# Patient Record
Sex: Female | Born: 1970 | Race: White | Hispanic: No | Marital: Married | Smoking: Never smoker
Health system: Southern US, Community
[De-identification: ages and names within clinical notes are randomized; demographics above are authoritative.]

## PROBLEM LIST (undated history)

## (undated) DIAGNOSIS — R112 Nausea with vomiting, unspecified: Secondary | ICD-10-CM

## (undated) DIAGNOSIS — IMO0002 Reserved for concepts with insufficient information to code with codable children: Secondary | ICD-10-CM

## (undated) DIAGNOSIS — F419 Anxiety disorder, unspecified: Secondary | ICD-10-CM

## (undated) DIAGNOSIS — K449 Diaphragmatic hernia without obstruction or gangrene: Secondary | ICD-10-CM

## (undated) DIAGNOSIS — T8859XA Other complications of anesthesia, initial encounter: Secondary | ICD-10-CM

## (undated) DIAGNOSIS — Z8489 Family history of other specified conditions: Secondary | ICD-10-CM

## (undated) DIAGNOSIS — E039 Hypothyroidism, unspecified: Secondary | ICD-10-CM

## (undated) DIAGNOSIS — R87619 Unspecified abnormal cytological findings in specimens from cervix uteri: Secondary | ICD-10-CM

## (undated) DIAGNOSIS — D496 Neoplasm of unspecified behavior of brain: Secondary | ICD-10-CM

## (undated) DIAGNOSIS — N920 Excessive and frequent menstruation with regular cycle: Secondary | ICD-10-CM

## (undated) DIAGNOSIS — K7689 Other specified diseases of liver: Secondary | ICD-10-CM

## (undated) DIAGNOSIS — Z9889 Other specified postprocedural states: Secondary | ICD-10-CM

## (undated) DIAGNOSIS — D649 Anemia, unspecified: Secondary | ICD-10-CM

## (undated) DIAGNOSIS — N649 Disorder of breast, unspecified: Secondary | ICD-10-CM

## (undated) DIAGNOSIS — F329 Major depressive disorder, single episode, unspecified: Secondary | ICD-10-CM

## (undated) DIAGNOSIS — G43109 Migraine with aura, not intractable, without status migrainosus: Secondary | ICD-10-CM

## (undated) DIAGNOSIS — T4145XA Adverse effect of unspecified anesthetic, initial encounter: Secondary | ICD-10-CM

## (undated) DIAGNOSIS — F32A Depression, unspecified: Secondary | ICD-10-CM

## (undated) HISTORY — DX: Neoplasm of unspecified behavior of brain: D49.6

## (undated) HISTORY — DX: Anemia, unspecified: D64.9

## (undated) HISTORY — DX: Anxiety disorder, unspecified: F41.9

## (undated) HISTORY — DX: Depression, unspecified: F32.A

## (undated) HISTORY — DX: Migraine with aura, not intractable, without status migrainosus: G43.109

## (undated) HISTORY — DX: Diaphragmatic hernia without obstruction or gangrene: K44.9

## (undated) HISTORY — DX: Excessive and frequent menstruation with regular cycle: N92.0

## (undated) HISTORY — DX: Reserved for concepts with insufficient information to code with codable children: IMO0002

## (undated) HISTORY — DX: Unspecified abnormal cytological findings in specimens from cervix uteri: R87.619

## (undated) HISTORY — DX: Major depressive disorder, single episode, unspecified: F32.9

## (undated) HISTORY — PX: CRYOTHERAPY: SHX1416

## (undated) HISTORY — DX: Other specified diseases of liver: K76.89

## (undated) HISTORY — DX: Disorder of breast, unspecified: N64.9

---

## 1996-05-29 HISTORY — PX: TUBAL LIGATION: SHX77

## 2006-05-29 HISTORY — PX: LAPAROSCOPIC CHOLECYSTECTOMY: SUR755

## 2007-05-30 HISTORY — PX: AUGMENTATION MAMMAPLASTY: SUR837

## 2008-05-29 HISTORY — PX: ENDOMETRIAL ABLATION: SHX621

## 2008-05-29 HISTORY — PX: BREAST BIOPSY: SHX20

## 2008-05-29 HISTORY — PX: ABDOMINOPLASTY: SUR9

## 2010-04-19 ENCOUNTER — Ambulatory Visit: Payer: Self-pay | Admitting: Diagnostic Radiology

## 2010-04-19 ENCOUNTER — Emergency Department (HOSPITAL_BASED_OUTPATIENT_CLINIC_OR_DEPARTMENT_OTHER): Admission: EM | Admit: 2010-04-19 | Discharge: 2010-04-19 | Payer: Self-pay | Admitting: Emergency Medicine

## 2010-08-09 LAB — POCT CARDIAC MARKERS
CKMB, poc: <1 ng/mL — ABNORMAL LOW (ref 1.0–8.0)
Myoglobin, poc: 32.7 ng/mL (ref 12–200)
Troponin i, poc: <0.05 ng/mL (ref 0.00–0.09)

## 2012-04-16 ENCOUNTER — Telehealth: Payer: Self-pay | Admitting: Family Medicine

## 2012-08-12 ENCOUNTER — Encounter: Payer: Self-pay | Admitting: Obstetrics and Gynecology

## 2012-08-13 ENCOUNTER — Encounter: Payer: Self-pay | Admitting: Gynecology

## 2012-08-13 ENCOUNTER — Ambulatory Visit (INDEPENDENT_AMBULATORY_CARE_PROVIDER_SITE_OTHER): Payer: BC Managed Care – PPO | Admitting: Gynecology

## 2012-08-13 VITALS — BP 104/76 | Ht 64.0 in | Wt 156.0 lb

## 2012-08-13 DIAGNOSIS — N949 Unspecified condition associated with female genital organs and menstrual cycle: Secondary | ICD-10-CM

## 2012-08-13 DIAGNOSIS — N920 Excessive and frequent menstruation with regular cycle: Secondary | ICD-10-CM | POA: Insufficient documentation

## 2012-08-13 DIAGNOSIS — R102 Pelvic and perineal pain: Secondary | ICD-10-CM | POA: Insufficient documentation

## 2012-08-13 NOTE — Patient Instructions (Signed)
Follow up for pelvic ultrasound, recommend motrin 600mg  every 6h as needed.

## 2012-08-13 NOTE — Progress Notes (Signed)
42 y.o. MarriedNot Hispanic or Latino female   [phone] here for complaint of pelvic pain.  Pain started 7 months ago with resumption of menses after thermoablation 2010.  Pain is primarily located low back/pelvis and radiating to right hip at last episode and is described as intermittent, sharp and stabbing.  Pain is aggravated by nothing specific and is associated with headache, diarrhea, nausea.  She has tried the following treatments advil 2 tabs twice  with little improvement in pain.  Pt reports blood is light, watery, nonclotted.  Current cycle lasted 9 days.  Pt denies postcoital bleeding. Pt reports this was 2nd cycle after ablation.  Patient is sexually active with last sexual activity 2 months ago.   ROS:  Nausea:  yes  Fever:  no  Weight loss/gain:  yes  Vaginal discharge or odor:  no  Other:  none  All other ROS questions are negative except as per HPI.  Exam:   BP 104/76  Wt 156 lb (70.761 kg)  LMP 07/19/2012 General appearance: alert and cooperative CV:  Not done Lungs:not done Abdomen:  soft, non-tender; bowel sounds normal; no masses,  no organomegaly Lymph:  Cervical adenopathy: none   Pelvic: External genitalia:  no lesions              Urethra: not indicated and normal appearing urethra with no masses, tenderness or lesions              Bartholins and Skenes: normal                 Vagina: normal appearing vagina with normal color and discharge, no lesions, vaginal discharge - clear and frothy, WET MOUNT done - results: negative for pathogens, normal epithelial cells              Cervix: normal appearance              Pap taken: no Bimanual Exam:  Uterus:  uterus is normal size, shape, consistency and nontender                               Adnexa:    not indicated and tenderness bilateral                               Rectovaginal: Confirms                               Anus:  normal sphincter tone, no lesions  Wet prep was obtained.  Results:  no pathogens  A:  pelvic pain      Pt with resumption of menses after endometrial ablation. Pt also with risk factors for cervical stenosis with 3 prior cryotherapies also with bilateral tubal ligation.  Pt at risk for hematometria.  Recommend pt return to office for pelvic ultrasound.  Pt reports normal TSH by PCP due to weight gain. hematometria discussed at length, questions addressed.  P: Labs:  none     Radiology:  Pelvic ultrasound     Medications:  Motrin 600mg  q6h prn, pt has     Patient will follow-up for ultrasound       An After Visit Summary was printed and given to the patient.

## 2012-08-14 ENCOUNTER — Other Ambulatory Visit: Payer: Self-pay | Admitting: *Deleted

## 2012-08-14 ENCOUNTER — Ambulatory Visit (INDEPENDENT_AMBULATORY_CARE_PROVIDER_SITE_OTHER): Payer: BC Managed Care – PPO

## 2012-08-14 ENCOUNTER — Ambulatory Visit: Payer: Self-pay | Admitting: Obstetrics and Gynecology

## 2012-08-14 ENCOUNTER — Encounter: Payer: Self-pay | Admitting: Gynecology

## 2012-08-14 ENCOUNTER — Ambulatory Visit (INDEPENDENT_AMBULATORY_CARE_PROVIDER_SITE_OTHER): Payer: BC Managed Care – PPO | Admitting: Gynecology

## 2012-08-14 DIAGNOSIS — N943 Premenstrual tension syndrome: Secondary | ICD-10-CM

## 2012-08-14 DIAGNOSIS — D649 Anemia, unspecified: Secondary | ICD-10-CM

## 2012-08-14 DIAGNOSIS — N9489 Other specified conditions associated with female genital organs and menstrual cycle: Secondary | ICD-10-CM

## 2012-08-14 DIAGNOSIS — R635 Abnormal weight gain: Secondary | ICD-10-CM

## 2012-08-14 LAB — CBC
HCT: 39.9 % (ref 36.0–46.0)
Hemoglobin: 13.5 g/dL (ref 12.0–15.0)
MCH: 28.2 pg (ref 26.0–34.0)
MCHC: 33.8 g/dL (ref 30.0–36.0)
MCV: 83.5 fL (ref 78.0–100.0)
Platelets: 344 10*3/uL (ref 150–400)
RBC: 4.78 MIL/uL (ref 3.87–5.11)
RDW: 13.7 % (ref 11.5–15.5)
WBC: 6.4 10*3/uL (ref 4.0–10.5)

## 2012-08-14 LAB — THYROID PANEL WITH TSH
Free Thyroxine Index: 2.3 (ref 1.0–3.9)
T3 Uptake: 32.3 % (ref 22.5–37.0)
T4, Total: 7.2 ug/dL (ref 5.0–12.5)
TSH: 1.258 u[IU]/mL (ref 0.350–4.500)

## 2012-08-14 MED ORDER — VENLAFAXINE HCL ER 75 MG PO CP24: 75.0000 mg | ORAL_CAPSULE | Freq: Every day | ORAL | Status: DC

## 2012-08-14 MED ORDER — VENLAFAXINE HCL ER 37.5 MG PO CP24: 37.5000 mg | ORAL_CAPSULE | Freq: Every day | ORAL | Status: DC

## 2012-08-14 NOTE — Progress Notes (Signed)
Pt presents today as follow up of yesterday's visit, ultrasound done today to evaluate for hematometria. Pt also expresses largest concerns are her moodiness that she is unable to track as her menses are no longer predictable since having her ablation in 2010 as well as unexplained weight gain. Pt had been treated with Pristiq at that time but discontinued due to 8lb of weight after short period although she reports feeling better.  Pt states that she has been working faithfully with a trainer for the past 37m but has not lost any weight.  In the past the patient had been treated with phentermine by another provider and failed to lose weight.   Her ultrasound report was reviewed with the patient. The uterus was notable for an inhomogeneous myometrium, suggestive of adenomyosis, no fibroids noted.  The endometrium was irregular and echodense.  The adnexa were normal except for a corpus luteum cyst noted on the right.  There was no free fluid noted.  The patient noted being tender during the exam.  A/P Uterine tenderness, uterine scarring leading to hematometria cannot be ruled out, the pt reports light, watery, nonclotted menses.  She is agreeable to watching her symptoms and reporting and vaginal bleeding.  She will take aleve as needed, she declines an rx for tramadol but can call or return to office if symptoms worsen. Regarding her weight gain-TFT's will be drawn, pt requests cbc as was noted anemic without follow up in past. PMDD-pt agreeable to try effexor for sx, will watch diet, exercise and weight.  rx 37.5mg  given, instructed to increase to 75mg  after 2 weeks and notify office of dose change

## 2012-08-14 NOTE — Patient Instructions (Addendum)
Keep calendar of premenstrual symptoms and response to effexor  -pelvic pain  -menses-length and quality  -weight and exercise routine Follow up 3 m, bring calendar to office visit

## 2012-08-15 ENCOUNTER — Telehealth: Payer: Self-pay | Admitting: Gynecology

## 2012-08-15 NOTE — Telephone Encounter (Signed)
PT wants to let Dr Farrel Gobble know her cycle started this morning

## 2012-11-11 ENCOUNTER — Ambulatory Visit: Payer: BC Managed Care – PPO | Admitting: Gynecology

## 2012-11-11 ENCOUNTER — Telehealth: Payer: Self-pay | Admitting: Certified Nurse Midwife

## 2012-11-11 NOTE — Telephone Encounter (Signed)
LMTCB to reschedule missed appointment for 3 month recheck.

## 2012-11-15 ENCOUNTER — Ambulatory Visit (INDEPENDENT_AMBULATORY_CARE_PROVIDER_SITE_OTHER): Payer: BC Managed Care – PPO | Admitting: Gynecology

## 2012-11-15 VITALS — BP 116/68 | HR 78 | Resp 14 | Ht 64.0 in | Wt 155.0 lb

## 2012-11-15 DIAGNOSIS — N943 Premenstrual tension syndrome: Secondary | ICD-10-CM

## 2012-11-15 DIAGNOSIS — R635 Abnormal weight gain: Secondary | ICD-10-CM

## 2012-11-15 DIAGNOSIS — R102 Pelvic and perineal pain: Secondary | ICD-10-CM

## 2012-11-15 DIAGNOSIS — N949 Unspecified condition associated with female genital organs and menstrual cycle: Secondary | ICD-10-CM

## 2012-11-15 MED ORDER — VENLAFAXINE HCL ER 37.5 MG PO CP24: 37.5000 mg | ORAL_CAPSULE | Freq: Every day | ORAL | Status: DC

## 2012-11-15 NOTE — Progress Notes (Signed)
Pt here for follow up of weight gain and pelvic pain.  Pt is still working out with trainer on regular basis-5x/w, has adjusted regimen and has tracking intake carefully.  Pt recently did a cleanse but no weight loss, since last visit, she lost only 1#, friends lost 12#.  Pt did not loose weight on [employer].  Pt acutely stopped effexor 2w ago, due to sweats, insomnia, felt worse on 75mg , interested in going back to 37.g. Pt reports pelvic pain better but no menses since 06/2012, reports new onset of deep pelvic pain with sex but denies dryness.   Pt admits to being quick to discontinue diets and regimens if she doesn't see results quickly, did Aktins for only 56m, lost 5# but regained after returning to prior diet.  Pt states weight gain was related to dietary changes assocaited with job that required a lot of travelling so a lot of fast food,but even though she had changed back to her former eating habits, she has not really lost weight.   PE: Genreal: NAD Pelvic exam: VULVA: normal appearing vulva with no masses, tenderness or lesions,  VAGINA: normal appearing vagina with normal color and discharge, no lesions,  CERVIX: normal appearing cervix without discharge or lesions,tender to deep palpation UTERUS: uterus is normal size, shape, consistency,tender to deep palpation ADNEXA: normal adnexa in size, nontender and no masses.  A:  Weight management Pelvic pain-history of ablation  P;  We discussed the importance of smart choices regarding calories, not all are equal, her typical breakfast is high in carbs, low in protein and low in calories, we suggested that the carbs might be converted to fat as her caloric intake seems variable and low, suggested increasing the proteins, complex carbohydrates and decreasing the simple carbs.  We discussed the importance of hidden calories.  Changing exercise regiment, BMI not as important if she is gaining muscle and loosing fat and might do better looking at  the whole image and not the scale Recommend that she give herself a longer window of time before getting discouraged and quitting. She agrees Suspect the pain is related to small amounts of blood trapped from the ablation, suggest position change to avoid hitting cervix and uterus Refill on effexor 37.5 will stay at this dose.  Pt is leaving for 46m trip to [place] unclear if she will start rx before or after returns. Pt will be cooking many meals on vacation as opposed to eating out Length of visit 41m, >50% face to face discussing diet and exercise

## 2013-01-08 ENCOUNTER — Other Ambulatory Visit: Payer: Self-pay | Admitting: Gynecology

## 2013-01-08 NOTE — Telephone Encounter (Signed)
eScribe request for refill on VENLAFAXINE Last filled - 11/15/12, #30 X 7 Last OV - 11/15/12 Next AEX - 03/03/13 RX sent at last visit for 37.5 mg dose.  RX denied for 75mg  dose.

## 2013-03-03 ENCOUNTER — Ambulatory Visit: Payer: Self-pay | Admitting: Obstetrics and Gynecology

## 2013-03-12 ENCOUNTER — Encounter: Payer: Self-pay | Admitting: Gynecology

## 2013-03-12 ENCOUNTER — Ambulatory Visit (INDEPENDENT_AMBULATORY_CARE_PROVIDER_SITE_OTHER): Payer: BC Managed Care – PPO | Admitting: Gynecology

## 2013-03-12 VITALS — BP 100/74 | HR 72 | Resp 16 | Ht 64.0 in | Wt 166.0 lb

## 2013-03-12 DIAGNOSIS — R635 Abnormal weight gain: Secondary | ICD-10-CM

## 2013-03-12 DIAGNOSIS — N943 Premenstrual tension syndrome: Secondary | ICD-10-CM | POA: Insufficient documentation

## 2013-03-12 DIAGNOSIS — Z0000 Encounter for general adult medical examination without abnormal findings: Secondary | ICD-10-CM

## 2013-03-12 DIAGNOSIS — Z01419 Encounter for gynecological examination (general) (routine) without abnormal findings: Secondary | ICD-10-CM

## 2013-03-12 DIAGNOSIS — Z9889 Other specified postprocedural states: Secondary | ICD-10-CM | POA: Insufficient documentation

## 2013-03-12 LAB — POCT URINALYSIS DIPSTICK
Leukocytes, UA: NEGATIVE
Urobilinogen, UA: NEGATIVE
pH, UA: 5

## 2013-03-12 LAB — COMPREHENSIVE METABOLIC PANEL
ALT: 11 U/L (ref 0–35)
AST: 15 U/L (ref 0–37)
Albumin: 4.3 g/dL (ref 3.5–5.2)
Alkaline Phosphatase: 63 U/L (ref 39–117)
BUN: 16 mg/dL (ref 6–23)
CO2: 27 mEq/L (ref 19–32)
Calcium: 9.5 mg/dL (ref 8.4–10.5)
Chloride: 104 mEq/L (ref 96–112)
Creat: 0.69 mg/dL (ref 0.50–1.10)
Glucose, Bld: 87 mg/dL (ref 70–99)
Potassium: 3.8 mEq/L (ref 3.5–5.3)
Sodium: 135 mEq/L (ref 135–145)
Total Bilirubin: 0.3 mg/dL (ref 0.3–1.2)
Total Protein: 6.6 g/dL (ref 6.0–8.3)

## 2013-03-12 LAB — HEMOGLOBIN, FINGERSTICK: Hemoglobin, fingerstick: 12.9 g/dL (ref 12.0–16.0)

## 2013-03-12 NOTE — Patient Instructions (Signed)

## 2013-03-12 NOTE — Progress Notes (Signed)
42 y.o. Married Caucasian female   [phone] here for annual exam. Pt is currently sexually active.  Menses restarted after ablation, 8/14-6d. Associated with pain and migraine, 01/2013 migraine and dysmenorrhea.  Pt denies any bleeding after sex, new onset of pain-too deep, no change in position.  Pt happy with effexor 37.5 Pt had gained 11# in 63m, recently joined weight watchers, exercises on a regular basis and until recently was seeing a Systems analyst.  Patient's last menstrual period was 03/09/2013.          Sexually active: yes  The current method of family planning is tubal ligation.    Exercising: yes  cardio 4-5x/wk Last pap: 02/26/12 NEG  Alcohol: no Tobacco: no BSE: yes Mammogram:  2014-will send results  Hgb:12.9 ; Urine: Negative    Health Maintenance  Topic Date Due  . Pap Smear  09/09/1986  . Influenza Vaccine  12/27/2012  . Tetanus/tdap  05/29/2017    Family History  Problem Relation Age of Onset  . Hypertension Mother   . Diabetes Mother 46    Type 2  . Anesthesia problems Mother     anesthetic complication at hysterectomy  . Hypertension Sister     Patient Active Problem List   Diagnosis Date Noted  . Anemia 08/14/2012  . Pelvic pain in female 08/13/2012  . Menorrhagia     Past Medical History  Diagnosis Date  . Anxiety   . Depression   . Abnormal Pap smear   . Anemia     iron deficient  . Breast disorder     precencerous lesion, marker placed  . Migraine headache with aura   . Menorrhagia     Past Surgical History  Procedure Laterality Date  . Tubal ligation  1998  . Laparoscopic cholecystectomy  2008  . Endometrial ablation  2010  . Augmentation mammaplasty Bilateral 2009  . Breast biopsy Left 2010    precancerous bx  . Abdominoplasty  2010  . Cryotherapy      cervix-3x    Allergies: Adhesive; Lortab; and Penicillins  Current Outpatient Prescriptions  Medication Sig Dispense Refill  . Ibuprofen (ADVIL PO) Take by mouth as  needed.       . IRON PO Take by mouth.      . Multiple Vitamin (MULTIVITAMIN) tablet Take 1 tablet by mouth daily.      Marland Kitchen topiramate (TOPAMAX) 100 MG tablet Take 100 mg by mouth 2 (two) times daily.      Marland Kitchen venlafaxine XR (EFFEXOR XR) 37.5 MG 24 hr capsule Take 1 capsule (37.5 mg total) by mouth daily.  30 capsule  7  . venlafaxine XR (EFFEXOR XR) 75 MG 24 hr capsule Take 1 capsule (75 mg total) by mouth daily.  30 capsule  2   No current facility-administered medications for this visit.    ROS: Pertinent items are noted in HPI.  Exam:    Ht 5\' 4"  (1.626 m)  Wt 166 lb (75.297 kg)  BMI 28.48 kg/m2  LMP 03/09/2013 Weight change: @WEIGHTCHANGE @ Last 3 height recordings:  Ht Readings from Last 3 Encounters:  03/12/13 5\' 4"  (1.626 m)  11/15/12 5\' 4"  (1.626 m)  08/13/12 5\' 4"  (1.626 m)   General appearance: alert, cooperative and appears stated age Head: Normocephalic, without obvious abnormality, atraumatic Neck: no adenopathy, no carotid bruit, no JVD, supple, symmetrical, trachea midline and thyroid not enlarged, symmetric, no tenderness/mass/nodules Lungs: clear to auscultation bilaterally Breasts: normal appearance, no masses or tenderness Heart: regular rate  and rhythm, S1, S2 normal, no murmur, click, rub or gallop Abdomen: soft, non-tender; bowel sounds normal; no masses,  no organomegaly Extremities: extremities normal, atraumatic, no cyanosis or edema Skin: Skin color, texture, turgor normal. No rashes or lesions Lymph nodes: Cervical, supraclavicular, and axillary nodes normal. no inguinal nodes palpated Neurologic: Grossly normal   Pelvic: External genitalia:  no lesions              Urethra: normal appearing urethra with no masses, tenderness or lesions              Bartholins and Skenes: normal                 Vagina: normal appearing vagina with normal color and discharge, no lesions              Cervix: normal appearance              Pap taken: yes        Bimanual  Exam:  Uterus:  uterus is normal size, shape, consistency and nontender                                      Adnexa:    normal adnexa in size, nontender and no masses                                      Rectovaginal: Confirms                                      Anus:  normal sphincter tone, no lesions  A: well woman PMS on effexor Weight gain S/p ablation dysmenorrhea     P: mammogram-pt will fax results PAP with HrHPV-record review shows ASCUS, cryo 10y before for? Guidelines reviewed Discussed stopping effexor if cause of weight gain or consider short course of phentiramine Discussed dysmenorrhea s/p ablation, ok to watch for now, most severe 1d before menses counseled on breast self exam, mammography screening, adequate intake of calcium and vitamin D, diet and exercise return annually or prn   An After Visit Summary was printed and given to the patient.

## 2013-03-17 LAB — IPS PAP TEST WITH HPV

## 2014-01-10 ENCOUNTER — Emergency Department (HOSPITAL_BASED_OUTPATIENT_CLINIC_OR_DEPARTMENT_OTHER)
Admission: EM | Admit: 2014-01-10 | Discharge: 2014-01-10 | Disposition: A | Discharge status: Home / Self Care | Payer: BC Managed Care – PPO | Attending: Emergency Medicine | Admitting: Emergency Medicine

## 2014-01-10 ENCOUNTER — Encounter (HOSPITAL_BASED_OUTPATIENT_CLINIC_OR_DEPARTMENT_OTHER): Payer: Self-pay | Admitting: Emergency Medicine

## 2014-01-10 ENCOUNTER — Emergency Department (HOSPITAL_BASED_OUTPATIENT_CLINIC_OR_DEPARTMENT_OTHER): Payer: BC Managed Care – PPO

## 2014-01-10 DIAGNOSIS — Z862 Personal history of diseases of the blood and blood-forming organs and certain disorders involving the immune mechanism: Secondary | ICD-10-CM | POA: Insufficient documentation

## 2014-01-10 DIAGNOSIS — F329 Major depressive disorder, single episode, unspecified: Secondary | ICD-10-CM | POA: Diagnosis not present

## 2014-01-10 DIAGNOSIS — IMO0002 Reserved for concepts with insufficient information to code with codable children: Secondary | ICD-10-CM | POA: Insufficient documentation

## 2014-01-10 DIAGNOSIS — F411 Generalized anxiety disorder: Secondary | ICD-10-CM | POA: Insufficient documentation

## 2014-01-10 DIAGNOSIS — Z79899 Other long term (current) drug therapy: Secondary | ICD-10-CM | POA: Insufficient documentation

## 2014-01-10 DIAGNOSIS — F3289 Other specified depressive episodes: Secondary | ICD-10-CM | POA: Diagnosis not present

## 2014-01-10 DIAGNOSIS — Y9389 Activity, other specified: Secondary | ICD-10-CM | POA: Diagnosis not present

## 2014-01-10 DIAGNOSIS — Z8742 Personal history of other diseases of the female genital tract: Secondary | ICD-10-CM | POA: Insufficient documentation

## 2014-01-10 DIAGNOSIS — G43909 Migraine, unspecified, not intractable, without status migrainosus: Secondary | ICD-10-CM | POA: Diagnosis not present

## 2014-01-10 DIAGNOSIS — W010XXA Fall on same level from slipping, tripping and stumbling without subsequent striking against object, initial encounter: Secondary | ICD-10-CM | POA: Diagnosis not present

## 2014-01-10 DIAGNOSIS — Y929 Unspecified place or not applicable: Secondary | ICD-10-CM | POA: Insufficient documentation

## 2014-01-10 DIAGNOSIS — Z880 Allergy status to penicillin: Secondary | ICD-10-CM | POA: Diagnosis not present

## 2014-01-10 DIAGNOSIS — M533 Sacrococcygeal disorders, not elsewhere classified: Secondary | ICD-10-CM

## 2014-01-10 DIAGNOSIS — Z791 Long term (current) use of non-steroidal anti-inflammatories (NSAID): Secondary | ICD-10-CM | POA: Diagnosis not present

## 2014-01-10 MED ORDER — OXYCODONE-ACETAMINOPHEN 5-325 MG PO TABS: 1.0000 | ORAL_TABLET | Freq: Four times a day (QID) | ORAL | Status: DC | PRN

## 2014-01-10 NOTE — ED Provider Notes (Signed)
CSN: [id number]     Arrival date & time 01/10/14  1709 History   First MD Initiated Contact with Patient 01/10/14 1730     Chief Complaint  Patient presents with  . Fall     (Consider location/radiation/quality/duration/timing/severity/associated sxs/prior Treatment) HPI Comments: Patient is a 43 year old female who presents to the emergency department complaining of buttock pain x6 days. Patient reports 6 days ago she slipped on an uneven step causing her to fall directly onto her buttock area. She noticed immediately that she had some scrapes. Pain has remained present over the past 6 days, however worsening over the past 2 days. Pain worse with sitting or laying on her buttock area. She is able to have normal BM, however has pain when she bares down. She has tried taking Advil with minimal relief. States she has slight lower back pain. Denies pain, numbness or tingling radiating down her extremities. Denies loss of control of bowels or bladder or saddle anesthesia.  Patient is a 43 y.o. female presenting with fall. The history is provided by the patient.  Fall    Past Medical History  Diagnosis Date  . Anxiety   . Depression   . Abnormal Pap smear   . Anemia     iron deficient  . Breast disorder     precencerous lesion, marker placed  . Migraine headache with aura   . Menorrhagia    Past Surgical History  Procedure Laterality Date  . Tubal ligation  1998  . Laparoscopic cholecystectomy  2008  . Endometrial ablation  2010  . Augmentation mammaplasty Bilateral 2009  . Breast biopsy Left 2010    precancerous bx  . Abdominoplasty  2010  . Cryotherapy      cervix-3x   Family History  Problem Relation Age of Onset  . Hypertension Mother   . Diabetes Mother 79    Type 2  . Anesthesia problems Mother     anesthetic complication at hysterectomy  . Hypertension Sister   . Diabetes Brother    History  Substance Use Topics  . Smoking status: Never Smoker   . Smokeless  tobacco: Not on file  . Alcohol Use: 1.0 oz/week    2 drink(s) per week     Comment: occ wine    OB History   Grav Para Term Preterm Abortions TAB SAB Ect Mult Living   3 3 3       3      Review of Systems  Musculoskeletal: Positive for back pain.       + buttock pain.  All other systems reviewed and are negative.     Allergies  Adhesive; Lortab; and Penicillins  Home Medications   Prior to Admission medications   Medication Sig Start Date End Date Taking? Authorizing Provider  Celecoxib (CELEBREX PO) Take by mouth.    Historical Provider, MD  Ibuprofen (ADVIL PO) Take by mouth as needed.     Historical Provider, MD  IRON PO Take by mouth.    Historical Provider, MD  Multiple Vitamin (MULTIVITAMIN) tablet Take 1 tablet by mouth daily.    Historical Provider, MD  oxyCODONE-acetaminophen (PERCOCET) 5-325 MG per tablet Take 1-2 tablets by mouth every 6 (six) hours as needed for severe pain. 01/10/14   Illene Labrador, PA-C  topiramate (TOPAMAX) 100 MG tablet Take 100 mg by mouth 2 (two) times daily.    Historical Provider, MD  venlafaxine XR (EFFEXOR XR) 37.5 MG 24 hr capsule Take 1 capsule (37.5 mg  total) by mouth daily. 11/15/12   Azalia Bilis, MD   BP 122/77  Pulse 80  Temp(Src) 98 F (36.7 C) (Oral)  Resp 18  Ht 5\' 4"  (1.626 m)  Wt 165 lb (74.844 kg)  BMI 28.31 kg/m2  SpO2 100%  LMP 10/10/2013 Physical Exam  Nursing note and vitals reviewed. Constitutional: She is oriented to person, place, and time. She appears well-developed and well-nourished. No distress.  HENT:  Head: Normocephalic and atraumatic.  Mouth/Throat: Oropharynx is clear and moist.  Eyes: Conjunctivae and EOM are normal.  Neck: Normal range of motion. Neck supple. No spinous process tenderness and no muscular tenderness present.  Cardiovascular: Normal rate, regular rhythm and normal heart sounds.   Pulmonary/Chest: Effort normal and breath sounds normal. No respiratory distress.  Musculoskeletal:  Normal range of motion. She exhibits no edema.  TTP lower lumbar spine into sacrum and coccyx. Small amount of bruising to bilateral buttock area. Full lumbar ROM. Full ROM bilateral hips without pain.  Neurological: She is alert and oriented to person, place, and time. She has normal strength. No sensory deficit.  Strength lower extremities 5/5 and equal bilateral. Sensation intact. Normal gait.  Skin: Skin is warm and dry. No rash noted. She is not diaphoretic.  Psychiatric: She has a normal mood and affect. Her behavior is normal.    ED Course  Procedures (including critical care time) Labs Review Labs Reviewed - No data to display  Imaging Review Dg Lumbar Spine Complete  01/10/2014   CLINICAL DATA:  Fall 1 week ago.  Back pain.  EXAM: LUMBAR SPINE - COMPLETE 4+ VIEW  COMPARISON:  None.  FINDINGS: Vertebral body height and alignment are maintained. No pars interarticularis defect is identified. Intervertebral disc space height is normal. The patient is status post cholecystectomy. A small sclerotic lesion in the left ilium is likely a bone island.  IMPRESSION: Negative exam.   Electronically Signed   By: Inge Rise M.D.   On: 01/10/2014 18:20   Dg Sacrum/coccyx  01/10/2014   CLINICAL DATA:  Status post fall 1 week ago.  Coccygeal pain.  EXAM: SACRUM AND COCCYX - 2+ VIEW  COMPARISON:  None.  FINDINGS: There is no acute bony or joint abnormality. Small, spiculated sclerotic lesion in the left ilium is likely a bone island.  IMPRESSION: No acute finding.   Electronically Signed   By: Inge Rise M.D.   On: 01/10/2014 18:19     EKG Interpretation None      MDM   Final diagnoses:  Coccyalgia   Patient presenting with pain into her buttock area and low back. She is well appearing and in no apparent distress. Vital signs stable. No red flags concerning patient's back pain. No s/s of central cord compression or cauda equina. Lower extremities are neurovascularly intact and  patient is ambulating without difficulty. X-rays without any acute findings. Advised patient to continue with NSAIDs, ice, will give 6 Percocet for severe pain. Followup with PCP. Stable for discharge. Return precautions given. Patient states understanding of treatment care plan and is agreeable.   Illene Labrador, PA-C 01/10/14 1836

## 2014-01-10 NOTE — ED Notes (Signed)
Pt tripped and fell on Wednesday landing on her buttocks. She has "tailbone" pain that has been getting increasingly worse since the fall.

## 2014-01-10 NOTE — ED Provider Notes (Signed)
History/physical exam/procedure(s) were performed by non-physician practitioner and as supervising physician I was immediately available for consultation/collaboration. I have reviewed all notes and am in agreement with care and plan.   Shaune Pollack, MD 01/10/14 2223

## 2014-01-10 NOTE — ED Notes (Signed)
Patient transported to X-ray 

## 2014-01-10 NOTE — Discharge Instructions (Signed)
Apply ice to the area where you are sore. You may also send a warm bath. Using a doughnut to sit on may help relieve some pain. Take ibuprofen, naproxen or Tylenol every 6-8 hours as needed for pain. Take percocet for severe pain only. No driving or operating heavy machinery while taking percocet. This medication may cause drowsiness.  Tailbone Injury The tailbone (coccyx) is the small bone at the lower end of the spine. A tailbone injury may involve stretched ligaments, bruising, or a broken bone (fracture). Women are more vulnerable to this injury due to having a wider pelvis. CAUSES  This type of injury typically occurs from falling and landing on the tailbone. Repeated strain or friction from actions such as rowing and bicycling may also injure the area. The tailbone can be injured during childbirth. Infections or tumors may also press on the tailbone and cause pain. Sometimes, the cause of injury is unknown. SYMPTOMS   Bruising.  Pain when sitting.  Painful bowel movements.  In women, pain during intercourse. DIAGNOSIS  Your caregiver can diagnose a tailbone injury based on your symptoms and a physical exam. X-rays may be taken if a fracture is suspected. Your caregiver may also use an MRI scan imaging test to evaluate your symptoms. TREATMENT  Your caregiver may prescribe medicines to help relieve your pain. Most tailbone injuries heal on their own in 4 to 6 weeks. However, if the injury is caused by an infection or tumor, the recovery period may vary. PREVENTION  Wear appropriate padding and sports gear when bicycling and rowing. This can help prevent an injury from repeated strain or friction. HOME CARE INSTRUCTIONS   Put ice on the injured area.  Put ice in a plastic bag.  Place a towel between your skin and the bag.  Leave the ice on for 15-20 minutes, every hour while awake for the first 1 to 2 days.  Sit on a large, rubber or inflated ring or cushion to ease your pain. Lean  forward when sitting to help decrease discomfort.  Avoid sitting for long periods of time.  Increase your activity as the pain allows.  Only take over-the-counter or prescription medicines for pain, discomfort, or fever as directed by your caregiver.  You may use stool softeners if it is painful to have a bowel movement, or as directed by your caregiver.  Eat a diet with plenty of fiber to help prevent constipation.  Keep all follow-up appointments as directed by your caregiver. SEEK MEDICAL CARE IF:   Your pain becomes worse.  Your bowel movements cause a great deal of discomfort.  You are unable to have a bowel movement.  You have a fever. MAKE SURE YOU:  Understand these instructions.  Will watch your condition.  Will get help right away if you are not doing well or get worse. Document Released: 05/12/2000 Document Revised: 08/07/2011 Document Reviewed: 12/08/2010 [facility] Patient Information 2015 [place], [place]. This information is not intended to replace advice given to you by your health care provider. Make sure you discuss any questions you have with your health care provider.

## 2014-03-13 ENCOUNTER — Ambulatory Visit: Payer: BC Managed Care – PPO | Admitting: Gynecology

## 2014-03-16 ENCOUNTER — Encounter: Payer: Self-pay | Admitting: Gynecology

## 2014-03-16 ENCOUNTER — Ambulatory Visit: Payer: BC Managed Care – PPO | Admitting: Gynecology

## 2014-03-30 ENCOUNTER — Encounter (HOSPITAL_BASED_OUTPATIENT_CLINIC_OR_DEPARTMENT_OTHER): Payer: Self-pay | Admitting: Emergency Medicine

## 2014-11-26 ENCOUNTER — Ambulatory Visit: Payer: Self-pay | Admitting: Nurse Practitioner

## 2014-11-26 ENCOUNTER — Telehealth: Payer: Self-pay | Admitting: Nurse Practitioner

## 2014-11-26 NOTE — Telephone Encounter (Signed)
Patient called and cancelled her appointment for her AEX today due to her "husband's colonoscopy." She rescheduled for 12/02/14 with Dr. Talbert Nan. FYI only.

## 2014-12-02 ENCOUNTER — Ambulatory Visit: Payer: Self-pay | Admitting: Obstetrics and Gynecology

## 2014-12-02 ENCOUNTER — Encounter: Payer: Self-pay | Admitting: Obstetrics and Gynecology

## 2015-03-11 ENCOUNTER — Encounter: Payer: Self-pay | Admitting: Gynecology

## 2015-03-11 ENCOUNTER — Other Ambulatory Visit (HOSPITAL_COMMUNITY)
Admission: RE | Admit: 2015-03-11 | Discharge: 2015-03-11 | Disposition: A | Discharge status: Home / Self Care | Payer: BLUE CROSS/BLUE SHIELD | Source: Ambulatory Visit | Attending: Gynecology | Admitting: Gynecology

## 2015-03-11 ENCOUNTER — Ambulatory Visit (INDEPENDENT_AMBULATORY_CARE_PROVIDER_SITE_OTHER): Payer: BLUE CROSS/BLUE SHIELD | Admitting: Gynecology

## 2015-03-11 VITALS — BP 124/80 | Ht 64.0 in | Wt 164.0 lb

## 2015-03-11 DIAGNOSIS — R102 Pelvic and perineal pain: Secondary | ICD-10-CM | POA: Diagnosis not present

## 2015-03-11 DIAGNOSIS — R61 Generalized hyperhidrosis: Secondary | ICD-10-CM | POA: Diagnosis not present

## 2015-03-11 DIAGNOSIS — N9419 Other specified dyspareunia: Secondary | ICD-10-CM | POA: Diagnosis not present

## 2015-03-11 DIAGNOSIS — Z01419 Encounter for gynecological examination (general) (routine) without abnormal findings: Secondary | ICD-10-CM | POA: Insufficient documentation

## 2015-03-11 DIAGNOSIS — Z1151 Encounter for screening for human papillomavirus (HPV): Secondary | ICD-10-CM | POA: Diagnosis present

## 2015-03-11 LAB — TSH: TSH: 1.084 u[IU]/mL (ref 0.350–4.500)

## 2015-03-11 NOTE — Patient Instructions (Signed)
Laparoscopically Assisted Vaginal Hysterectomy A laparoscopically assisted vaginal hysterectomy (LAVH) is a surgical procedure to remove the uterus and cervix, and sometimes the ovaries and fallopian tubes. During an LAVH, some of the surgical removal is done through the vagina, and the rest is done through a few small surgical cuts (incisions) in the abdomen.  This procedure is usually considered in women when a vaginal hysterectomy is not an option. Your health care provider will discuss the risks and benefits of the different surgical techniques at your appointment. Generally, recovery time is faster and there are fewer complications after laparoscopic procedures than after open incisional procedures. LET YOUR HEALTH CARE PROVIDER KNOW ABOUT:   Any allergies you have.  All medicines you are taking, including vitamins, herbs, eye drops, creams, and over-the-counter medicines.  Previous problems you or members of your family have had with the use of anesthetics.  Any blood disorders you have.  Previous surgeries you have had.  Medical conditions you have. RISKS AND COMPLICATIONS Generally, this is a safe procedure. However, as with any procedure, complications can occur. Possible complications include:  Allergies to medicines.  Difficulty breathing.  Bleeding.  Infection.  Damage to other structures near your uterus and cervix. BEFORE THE PROCEDURE  Ask your health care provider about changing or stopping your regular medicines.  Take certain medicines, such as a colon-emptying preparation, as directed.  Do not eat or drink anything for at least 8 hours before your surgery.  Stop smoking if you smoke. Stopping will improve your health after surgery.  Arrange for a ride home after surgery and for help at home during recovery. PROCEDURE   An IV tube will be put into one of your veins in order to give you fluids and medicines.  You will receive medicines to relax you and  medicines that make you sleep (general anesthetic).  You may have a flexible tube (catheter) put into your bladder to drain urine.  You may have a tube put through your nose or mouth that goes into your stomach (nasogastric tube). The nasogastric tube removes digestive fluids and prevents you from feeling nauseated and from vomiting.  Tight-fitting (compression) stockings will be placed on your legs to promote circulation.  Three to four small incisions will be made in your abdomen. An incision also will be made in your vagina. Probes and tools will be inserted into the small incisions. The uterus and cervix are removed (and possibly your ovaries and fallopian tubes) through your vagina as well as through the small incisions that were made in the abdomen.  Your vagina is then sewn back to normal. AFTER THE PROCEDURE  You may have a liquid diet temporarily. You will most likely return to, and tolerate, your usual diet the day after surgery.  You will be passing urine through a catheter. It will be removed the day after surgery.  Your temperature, breathing rate, heart rate, blood pressure, and oxygen level will be monitored regularly.  You will still wear compression stockings on your legs until you are able to move around.  You will use a special device or do breathing exercises to keep your lungs clear.  You will be encouraged to walk as soon as possible.   This information is not intended to replace advice given to you by your health care provider. Make sure you discuss any questions you have with your health care provider.   Document Released: 05/04/2011 Document Revised: 06/05/2014 Document Reviewed: 11/28/2012 Elsevier Interactive Patient Education 2016 Else[employer] Endometriosis Endometriosis is a condition in which the tissue that lines the uterus (endometrium) grows outside of its normal location. The tissue may grow in many locations close to the uterus, but it commonly  grows on the ovaries, fallopian tubes, vagina, or bowel. Because the uterus expels, or sheds, its lining every menstrual cycle, there is bleeding wherever the endometrial tissue is located. This can cause pain because blood is irritating to tissues not normally exposed to it.  CAUSES  The cause of endometriosis is not known.  SIGNS AND SYMPTOMS  Often, there are no symptoms. When symptoms are present, they can vary with the location of the displaced tissue. Various symptoms can occur at different times. Although symptoms occur mainly during a woman's menstrual period, they can also occur midcycle and usually stop with menopause. Some people may go months with no symptoms at all. Symptoms may include:   Back or abdominal pain.   Heavier bleeding during periods.   Pain during intercourse.   Painful bowel movements.   Infertility. DIAGNOSIS  Your health care provider will do a physical exam and ask about your symptoms. Various tests may be done, such as:   Blood tests and urine tests. These are done to help rule out other problems.   Ultrasound. This test is done to look for abnormal tissue.   An X-ray of the lower bowel (barium enema).  Laparoscopy. In this procedure, a thin, lighted tube with a tiny camera on the end (laparoscope) is inserted into your abdomen. This helps your health care provider look for abnormal tissue to confirm the diagnosis. The health care provider may also remove a small piece of tissue (biopsy) from any abnormal tissue found. This tissue sample can then be sent to a lab so it can be looked at under a microscope. TREATMENT  Treatment will vary and may include:   Medicines to relieve pain. Nonsteroidal anti-inflammatory drugs (NSAIDs) are a type of pain medicine that can help to relieve the pain caused by endometriosis.  Hormonal therapy. When using hormonal therapy, periods are eliminated. This eliminates the monthly exposure to blood by the displaced  endometrial tissue.   Surgery. Surgery may sometimes be done to remove the abnormal endometrial tissue. In severe cases, surgery may be done to remove the fallopian tubes, uterus, and ovaries (hysterectomy). HOME CARE INSTRUCTIONS   Take all medicines as directed by your health care provider. Do not take aspirin because it may increase bleeding when you are not on hormonal therapy.   Avoid activities that produce pain, including sexual activity. SEEK MEDICAL CARE IF:  You have pelvic pain before, after, or during your periods.  You have pelvic pain between periods that gets worse during your period.  You have pelvic pain during or after sex.  You have pelvic pain with bowel movements or urination, especially during your period.  You have problems getting pregnant.  You have a fever. SEEK IMMEDIATE MEDICAL CARE IF:   Your pain is severe and is not responding to pain medicine.   You have severe nausea and vomiting, or you cannot keep foods down.   You have pain that is limited to the right lower part of your abdomen.   You have swelling or increasing pain in your abdomen.   You see blood in your stool.  MAKE SURE YOU:   Understand these instructions.  Will watch your condition.  Will get help right away if you are not doing well or get worse.  This information is not intended to replace advice given to you by your health care provider. Make sure you discuss any questions you have with your health care provider.   Document Released: 05/12/2000 Document Revised: 06/05/2014 Document Reviewed: 01/10/2013 Elsevier Interactive Patient Education 2016 [employer] At menopause, your body begins making less estrogen and progesterone hormones. This causes the body to stop having menstrual periods. This is because estrogen and progesterone hormones control your periods and menstrual cycle. A lack of estrogen may cause symptoms such as:  Hot flushes (or  hot flashes).  Vaginal dryness.  Dry skin.  Loss of sex drive.  Risk of bone loss (osteoporosis). When this happens, you may choose to take hormone therapy to get back the estrogen lost during menopause. When the hormone estrogen is given alone, it is usually referred to as ET (Estrogen Therapy). When the hormone progestin is combined with estrogen, it is generally called HT (Hormone Therapy). This was formerly known as hormone replacement therapy (HRT). Your caregiver can help you make a decision on what will be best for you. The decision to use HT seems to change often as new studies are done. Many studies do not agree on the benefits of hormone replacement therapy. LIKELY BENEFITS OF HT INCLUDE PROTECTION FROM:  Hot Flushes (also called hot flashes) - A hot flush is a sudden feeling of heat that spreads over the face and body. The skin may redden like a blush. It is connected with sweats and sleep disturbance. Women going through menopause may have hot flushes a few times a month or several times per day depending on the woman.  Osteoporosis (bone loss) - Estrogen helps guard against bone loss. After menopause, a woman's bones slowly lose calcium and become weak and brittle. As a result, bones are more likely to break. The hip, wrist, and spine are affected most often. Hormone therapy can help slow bone loss after menopause. Weight bearing exercise and taking calcium with vitamin D also can help prevent bone loss. There are also medications that your caregiver can prescribe that can help prevent osteoporosis.  Vaginal dryness - Loss of estrogen causes changes in the vagina. Its lining may become thin and dry. These changes can cause pain and bleeding during sexual intercourse. Dryness can also lead to infections. This can cause burning and itching. (Vaginal estrogen treatment can help relieve pain, itching, and dryness.)  Urinary tract infections are more common after menopause because of lack  of estrogen. Some women also develop urinary incontinence because of low estrogen levels in the vagina and bladder.  Possible other benefits of estrogen include a positive effect on mood and short-term memory in women. RISKS AND COMPLICATIONS  Using estrogen alone without progesterone causes the lining of the uterus to grow. This increases the risk of lining of the uterus (endometrial) cancer. Your caregiver should give another hormone called progestin if you have a uterus.  Women who take combined (estrogen and progestin) HT appear to have an increased risk of breast cancer. The risk appears to be small, but increases throughout the time that HT is taken.  Combined therapy also makes the breast tissue slightly denser which makes it harder to read mammograms (breast X-rays).  Combined, estrogen and progesterone therapy can be taken together every day, in which case there may be spotting of blood. HT therapy can be taken cyclically in which case you will have menstrual periods. Cyclically means HT is taken for a set amount of  days, then not taken, then this process is repeated.  HT may increase the risk of stroke, heart attack, breast cancer and forming blood clots in your leg.  Transdermal estrogen (estrogen that is absorbed through the skin with a patch or a cream) may have better results with:  Cholesterol.  Blood pressure.  Blood clots. Having the following conditions may indicate you should not have HT:  Endometrial cancer.  Liver disease.  Breast cancer.  Heart disease.  History of blood clots.  Stroke. TREATMENT   If you choose to take HT and have a uterus, usually estrogen and progestin are prescribed.  Your caregiver will help you decide the best way to take the medications.  Possible ways to take estrogen include:  Pills.  Patches.  Gels.  Sprays.  Vaginal estrogen cream, rings and tablets.  It is best to take the lowest dose possible that will help your  symptoms and take them for the shortest period of time that you can.  Hormone therapy can help relieve some of the problems (symptoms) that affect women at menopause. Before making a decision about HT, talk to your caregiver about what is best for you. Be well informed and comfortable with your decisions. HOME CARE INSTRUCTIONS   Follow your caregivers advice when taking the medications.  A Pap test is done to screen for cervical cancer.  The first Pap test should be done at age 65.  Between ages 33 and 16, Pap tests are repeated every 2 years.  Beginning at age 70, you are advised to have a Pap test every 3 years as long as the past 3 Pap tests have been normal.  Some women have medical problems that increase the chance of getting cervical cancer. Talk to your caregiver about these problems. It is especially important to talk to your caregiver if a new problem develops soon after your last Pap test. In these cases, your caregiver may recommend more frequent screening and Pap tests.  The above recommendations are the same for women who have or have not gotten the vaccine for HPV (human papillomavirus).  If you had a hysterectomy for a problem that was not a cancer or a condition that could lead to cancer, then you no longer need Pap tests. However, even if you no longer need a Pap test, a regular exam is a good idea to make sure no other problems are starting.  If you are between ages 12 and 48, and you have had normal Pap tests going back 10 years, you no longer need Pap tests. However, even if you no longer need a Pap test, a regular exam is a good idea to make sure no other problems are starting.  If you have had past treatment for cervical cancer or a condition that could lead to cancer, you need Pap tests and screening for cancer for at least 20 years after your treatment.  If Pap tests have been discontinued, risk factors (such as a new sexual partner)need to be re-assessed to  determine if screening should be resumed.  Some women may need screenings more often if they are at high risk for cervical cancer.  Get mammograms done as per the advice of your caregiver. SEEK IMMEDIATE MEDICAL CARE IF:  You develop abnormal vaginal bleeding.  You have pain or swelling in your legs, shortness of breath, or chest pain.  You develop dizziness or headaches.  You have lumps or changes in your breasts or armpits.  You have slurred  speech.  You develop weakness or numbness of your arms or legs.  You have pain, burning, or bleeding when urinating.  You develop abdominal pain.   This information is not intended to replace advice given to you by your health care provider. Make sure you discuss any questions you have with your health care provider.   Document Released: 02/11/2003 Document Revised: 09/29/2014 Document Reviewed: 11/16/2014 Elsevier Interactive Patient Education 2016 Lydia Jenkins is the time when your body begins to move into the menopause (no menstrual period for 12 straight months). It is a natural process. Perimenopause can begin 2-8 years before the menopause and usually lasts for 1 year after the menopause. During this time, your ovaries may or may not produce an egg. The ovaries vary in their production of estrogen and progesterone hormones each month. This can cause irregular menstrual periods, difficulty getting pregnant, vaginal bleeding between periods, and uncomfortable symptoms. CAUSES  Irregular production of the ovarian hormones, estrogen and progesterone, and not ovulating every month.  Other causes include:  Tumor of the pituitary gland in the brain.  Medical disease that affects the ovaries.  Radiation treatment.  Chemotherapy.  Unknown causes.  Heavy smoking and excessive alcohol intake can bring on perimenopause sooner. SIGNS AND SYMPTOMS   Hot flashes.  Night sweats.  Irregular menstrual  periods.  Decreased sex drive.  Vaginal dryness.  Headaches.  Mood swings.  Depression.  Memory problems.  Irritability.  Tiredness.  Weight gain.  Trouble getting pregnant.  The beginning of losing bone cells (osteoporosis).  The beginning of hardening of the arteries (atherosclerosis). DIAGNOSIS  Your health care provider will make a diagnosis by analyzing your age, menstrual history, and symptoms. He or she will do a physical exam and note any changes in your body, especially your female organs. Female hormone tests may or may not be helpful depending on the amount of female hormones you produce and when you produce them. However, other hormone tests may be helpful to rule out other problems. TREATMENT  In some cases, no treatment is needed. The decision on whether treatment is necessary during the perimenopause should be made by you and your health care provider based on how the symptoms are affecting you and your lifestyle. Various treatments are available, such as:  Treating individual symptoms with a specific medicine for that symptom.  Herbal medicines that can help specific symptoms.  Counseling.  Group therapy. HOME CARE INSTRUCTIONS   Keep track of your menstrual periods (when they occur, how heavy they are, how long between periods, and how long they last) as well as your symptoms and when they started.  Only take over-the-counter or prescription medicines as directed by your health care provider.  Sleep and rest.  Exercise.  Eat a diet that contains calcium (good for your bones) and soy (acts like the estrogen hormone).  Do not smoke.  Avoid alcoholic beverages.  Take vitamin supplements as recommended by your health care provider. Taking vitamin E may help in certain cases.  Take calcium and vitamin D supplements to help prevent bone loss.  Group therapy is sometimes helpful.  Acupuncture may help in some cases. SEEK MEDICAL CARE IF:   You  have questions about any symptoms you are having.  You need a referral to a specialist (gynecologist, psychiatrist, or psychologist). SEEK IMMEDIATE MEDICAL CARE IF:   You have vaginal bleeding.  Your period lasts longer than 8 days.  Your periods are recurring sooner than 21 days.  You have bleeding after intercourse.  You have severe depression.  You have pain when you urinate.  You have severe headaches.  You have vision problems.   This information is not intended to replace advice given to you by your health care provider. Make sure you discuss any questions you have with your health care provider.   Document Released: 06/22/2004 Document Revised: 06/05/2014 Document Reviewed: 12/12/2012 Elsevier Interactive Patient Education [employer].

## 2015-03-11 NOTE — Progress Notes (Signed)
Lydia Jenkins 1970-09-26 [id number]   History:    44 y.o.  for annual gyn exam who is a new patient to the practice. Patient with complaints of dyspareunia pelvic pains on and off. Patient several years ago had endometrial ablation. Patient had stated that menses had started back in 2014 after ablation. She reports no bleeding over the course the past year but does complain of dyspareunia as well. Patient no longer taking her Effexor because of her weight gain no issues with depression at the present time. Patient stated many years ago she had abnormal Pap smear and dysplasia. Her last Pap smear or records 2014 which was normal. Also review of her record indicated she had an ultrasound 2014 and there was evidence of adenomyosis. Patient also is complaining of on and off hot flashes but mostly night sweats. Her PCP has been doing her blood work and her vaccines are up-to-date.  Patient not gluten-free diet for celiac sprue. Her PCP has been checking her vitamin D level and patient stated that she's had history vitamin D deficiency for which she's currently on supplemental vitamin D. Patient reports recent colonoscopy and EGD normal with no evidence of colon polyps.  Past medical history,surgical history, family history and social history were all reviewed and documented in the EPIC chart.  Gynecologic History No LMP recorded. Contraception: vasectomy? Last Pap: 2014. Results were: normal Last mammogram: 2014. Results were: normal  Obstetric History OB History  Gravida Para Term Preterm AB SAB TAB Ectopic Multiple Living  3 3 3       3     # Outcome Date GA Lbr Len/2nd Weight Sex Delivery Anes PTL Lv  3 Term 1998 [redacted]w[redacted]d  9 lb 5 oz (4.224 kg) F Vag-Spont   Y  2 Term 1995 [redacted]w[redacted]d  8 lb 12 oz (3.969 kg) F Vag-Spont   Y  1 Term 1993 [redacted]w[redacted]d  7 lb 6 oz (3.345 kg) M Vag-Spont   Y       ROS: A ROS was performed and pertinent positives and negatives are included in the history.  GENERAL: No fevers  or chills. HEENT: No change in vision, no earache, sore throat or sinus congestion. NECK: No pain or stiffness. CARDIOVASCULAR: No chest pain or pressure. No palpitations. PULMONARY: No shortness of breath, cough or wheeze. GASTROINTESTINAL: No abdominal pain, nausea, vomiting or diarrhea, melena or bright red blood per rectum. GENITOURINARY: No urinary frequency, urgency, hesitancy or dysuria. MUSCULOSKELETAL: No joint or muscle pain, no back pain, no recent trauma. DERMATOLOGIC: No rash, no itching, no lesions. ENDOCRINE: No polyuria, polydipsia, no heat or cold intolerance. No recent change in weight. HEMATOLOGICAL: No anemia or easy bruising or bleeding. NEUROLOGIC: No headache, seizures, numbness, tingling or weakness. PSYCHIATRIC: No depression, no loss of interest in normal activity or change in sleep pattern.     Exam: chaperone present  BP 124/80 mmHg  Ht 5\' 4"  (1.626 m)  Wt 164 lb (74.39 kg)  BMI 28.14 kg/m2  Body mass index is 28.14 kg/(m^2).  General appearance : Well developed well nourished female. No acute distress HEENT: Eyes: no retinal hemorrhage or exudates,  Neck supple, trachea midline, no carotid bruits, no thyroidmegaly Lungs: Clear to auscultation, no rhonchi or wheezes, or rib retractions  Heart: Regular rate and rhythm, no murmurs or gallops Breast:Examined in sitting and supine position were symmetrical in appearance, no palpable masses or tenderness,  no skin retraction, no nipple inversion, no nipple discharge, no skin discoloration, no axillary  or supraclavicular lymphadenopathy Abdomen: no palpable masses or tenderness, no rebound or guarding Extremities: no edema or skin discoloration or tenderness  Pelvic:  Bartholin, Urethra, Skene Glands: Within normal limits             Vagina: No gross lesions or discharge  Cervix: No gross lesions or discharge  Uterus  anteverted, normal size, shape and consistency, non-tender and mobile  Adnexa  Without masses or  tenderness  Anus and perineum  normal   Rectovaginal  normal sphincter tone without palpated masses or tenderness             Hemoccult not indicated     Assessment/Plan:  44 y.o. female for annual exam with signs and symptoms suspicious for endometriosis. She complains of pelvic pains on and off especially dyspareunia. Ultrasound 2014 highly suspicious for adenomyosis. Patient will return back to the office in one week for an ultrasound to better assess her uterus and ovaries. I'm going to provided with literature information on endometriosis, laparoscopic-assisted vaginal hysterectomy if her symptoms are affecting her quality of life and also literature information on the perimenopause as well as on hormones. Pap smear was done today. We'll also check an [place] and TSH today.   Terrance Mass MD, 11:10 AM 03/11/2015

## 2015-03-12 LAB — FOLLICLE STIMULATING HORMONE: FSH: 97.2 m[IU]/mL

## 2015-03-12 LAB — CYTOLOGY - PAP

## 2015-03-29 ENCOUNTER — Other Ambulatory Visit: Payer: Self-pay

## 2015-03-29 DIAGNOSIS — Z1231 Encounter for screening mammogram for malignant neoplasm of breast: Secondary | ICD-10-CM

## 2015-03-31 ENCOUNTER — Ambulatory Visit: Payer: Self-pay | Admitting: Sports Medicine

## 2015-04-01 ENCOUNTER — Ambulatory Visit (INDEPENDENT_AMBULATORY_CARE_PROVIDER_SITE_OTHER): Payer: BLUE CROSS/BLUE SHIELD

## 2015-04-01 ENCOUNTER — Encounter: Payer: Self-pay | Admitting: Gynecology

## 2015-04-01 ENCOUNTER — Ambulatory Visit (INDEPENDENT_AMBULATORY_CARE_PROVIDER_SITE_OTHER): Payer: BLUE CROSS/BLUE SHIELD | Admitting: Gynecology

## 2015-04-01 VITALS — BP 128/76

## 2015-04-01 DIAGNOSIS — N951 Menopausal and female climacteric states: Secondary | ICD-10-CM | POA: Diagnosis not present

## 2015-04-01 DIAGNOSIS — Z8742 Personal history of other diseases of the female genital tract: Secondary | ICD-10-CM

## 2015-04-01 DIAGNOSIS — D251 Intramural leiomyoma of uterus: Secondary | ICD-10-CM

## 2015-04-01 DIAGNOSIS — R102 Pelvic and perineal pain: Secondary | ICD-10-CM

## 2015-04-01 DIAGNOSIS — N9419 Other specified dyspareunia: Secondary | ICD-10-CM

## 2015-04-01 DIAGNOSIS — M6289 Other specified disorders of muscle: Secondary | ICD-10-CM | POA: Diagnosis not present

## 2015-04-01 LAB — HEMOGLOBIN A1C
Hgb A1c MFr Bld: 5 % (ref <5.7)
Mean Plasma Glucose: 97 mg/dL (ref <117)

## 2015-04-01 LAB — FOLLICLE STIMULATING HORMONE: FSH: 95.8 m[IU]/mL

## 2015-04-01 NOTE — Patient Instructions (Signed)
Hormone Therapy At menopause, your body begins making less estrogen and progesterone hormones. This causes the body to stop having menstrual periods. This is because estrogen and progesterone hormones control your periods and menstrual cycle. A lack of estrogen may cause symptoms such as:  Hot flushes (or hot flashes).  Vaginal dryness.  Dry skin.  Loss of sex drive.  Risk of bone loss (osteoporosis). When this happens, you may choose to take hormone therapy to get back the estrogen lost during menopause. When the hormone estrogen is given alone, it is usually referred to as ET (Estrogen Therapy). When the hormone progestin is combined with estrogen, it is generally called HT (Hormone Therapy). This was formerly known as hormone replacement therapy (HRT). Your caregiver can help you make a decision on what will be best for you. The decision to use HT seems to change often as new studies are done. Many studies do not agree on the benefits of hormone replacement therapy. LIKELY BENEFITS OF HT INCLUDE PROTECTION FROM:  Hot Flushes (also called hot flashes) - A hot flush is a sudden feeling of heat that spreads over the face and body. The skin may redden like a blush. It is connected with sweats and sleep disturbance. Women going through menopause may have hot flushes a few times a month or several times per day depending on the woman.  Osteoporosis (bone loss) - Estrogen helps guard against bone loss. After menopause, a woman's bones slowly lose calcium and become weak and brittle. As a result, bones are more likely to break. The hip, wrist, and spine are affected most often. Hormone therapy can help slow bone loss after menopause. Weight bearing exercise and taking calcium with vitamin D also can help prevent bone loss. There are also medications that your caregiver can prescribe that can help prevent osteoporosis.  Vaginal dryness - Loss of estrogen causes changes in the vagina. Its lining may  become thin and dry. These changes can cause pain and bleeding during sexual intercourse. Dryness can also lead to infections. This can cause burning and itching. (Vaginal estrogen treatment can help relieve pain, itching, and dryness.)  Urinary tract infections are more common after menopause because of lack of estrogen. Some women also develop urinary incontinence because of low estrogen levels in the vagina and bladder.  Possible other benefits of estrogen include a positive effect on mood and short-term memory in women. RISKS AND COMPLICATIONS  Using estrogen alone without progesterone causes the lining of the uterus to grow. This increases the risk of lining of the uterus (endometrial) cancer. Your caregiver should give another hormone called progestin if you have a uterus.  Women who take combined (estrogen and progestin) HT appear to have an increased risk of breast cancer. The risk appears to be small, but increases throughout the time that HT is taken.  Combined therapy also makes the breast tissue slightly denser which makes it harder to read mammograms (breast X-rays).  Combined, estrogen and progesterone therapy can be taken together every day, in which case there may be spotting of blood. HT therapy can be taken cyclically in which case you will have menstrual periods. Cyclically means HT is taken for a set amount of days, then not taken, then this process is repeated.  HT may increase the risk of stroke, heart attack, breast cancer and forming blood clots in your leg.  Transdermal estrogen (estrogen that is absorbed through the skin with a patch or a cream) may have better results with:  Cholesterol.  Blood pressure.  Blood clots. Having the following conditions may indicate you should not have HT:  Endometrial cancer.  Liver disease.  Breast cancer.  Heart disease.  History of blood clots.  Stroke. TREATMENT   If you choose to take HT and have a uterus, usually  estrogen and progestin are prescribed.  Your caregiver will help you decide the best way to take the medications.  Possible ways to take estrogen include:  Pills.  Patches.  Gels.  Sprays.  Vaginal estrogen cream, rings and tablets.  It is best to take the lowest dose possible that will help your symptoms and take them for the shortest period of time that you can.  Hormone therapy can help relieve some of the problems (symptoms) that affect women at menopause. Before making a decision about HT, talk to your caregiver about what is best for you. Be well informed and comfortable with your decisions. HOME CARE INSTRUCTIONS   Follow your caregivers advice when taking the medications.  A Pap test is done to screen for cervical cancer.  The first Pap test should be done at age 34.  Between ages 80 and 52, Pap tests are repeated every 2 years.  Beginning at age 13, you are advised to have a Pap test every 3 years as long as the past 3 Pap tests have been normal.  Some women have medical problems that increase the chance of getting cervical cancer. Talk to your caregiver about these problems. It is especially important to talk to your caregiver if a new problem develops soon after your last Pap test. In these cases, your caregiver may recommend more frequent screening and Pap tests.  The above recommendations are the same for women who have or have not gotten the vaccine for HPV (human papillomavirus).  If you had a hysterectomy for a problem that was not a cancer or a condition that could lead to cancer, then you no longer need Pap tests. However, even if you no longer need a Pap test, a regular exam is a good idea to make sure no other problems are starting.  If you are between ages 20 and 60, and you have had normal Pap tests going back 10 years, you no longer need Pap tests. However, even if you no longer need a Pap test, a regular exam is a good idea to make sure no other problems  are starting.  If you have had past treatment for cervical cancer or a condition that could lead to cancer, you need Pap tests and screening for cancer for at least 20 years after your treatment.  If Pap tests have been discontinued, risk factors (such as a new sexual partner)need to be re-assessed to determine if screening should be resumed.  Some women may need screenings more often if they are at high risk for cervical cancer.  Get mammograms done as per the advice of your caregiver. SEEK IMMEDIATE MEDICAL CARE IF:  You develop abnormal vaginal bleeding.  You have pain or swelling in your legs, shortness of breath, or chest pain.  You develop dizziness or headaches.  You have lumps or changes in your breasts or armpits.  You have slurred speech.  You develop weakness or numbness of your arms or legs.  You have pain, burning, or bleeding when urinating.  You develop abdominal pain.   This information is not intended to replace advice given to you by your health care provider. Make sure you discuss any questions  you have with your health care provider.   Document Released: 02/11/2003 Document Revised: 09/29/2014 Document Reviewed: 11/16/2014 Elsevier Interactive Patient Education 2016 [place]. Menopause Menopause is the normal time of life when menstrual periods stop completely. Menopause is complete when you have missed 12 consecutive menstrual periods. It usually occurs between the ages of 57 years and 69 years. Very rarely does a woman develop menopause before the age of 65 years. At menopause, your ovaries stop producing the female hormones estrogen and progesterone. This can cause undesirable symptoms and also affect your health. Sometimes the symptoms may occur 4-5 years before the menopause begins. There is no relationship between menopause and:  Oral contraceptives.  Number of children you had.  Race.  The age your menstrual periods started (menarche). Heavy  smokers and very thin women may develop menopause earlier in life. CAUSES  The ovaries stop producing the female hormones estrogen and progesterone.  Other causes include:  Surgery to remove both ovaries.  The ovaries stop functioning for no known reason.  Tumors of the pituitary gland in the brain.  Medical disease that affects the ovaries and hormone production.  Radiation treatment to the abdomen or pelvis.  Chemotherapy that affects the ovaries. SYMPTOMS   Hot flashes.  Night sweats.  Decrease in sex drive.  Vaginal dryness and thinning of the vagina causing painful intercourse.  Dryness of the skin and developing wrinkles.  Headaches.  Tiredness.  Irritability.  Memory problems.  Weight gain.  Bladder infections.  Hair growth of the face and chest.  Infertility. More serious symptoms include:  Loss of bone (osteoporosis) causing breaks (fractures).  Depression.  Hardening and narrowing of the arteries (atherosclerosis) causing heart attacks and strokes. DIAGNOSIS   When the menstrual periods have stopped for 12 straight months.  Physical exam.  Hormone studies of the blood. TREATMENT  There are many treatment choices and nearly as many questions about them. The decisions to treat or not to treat menopausal changes is an individual choice made with your health care provider. Your health care provider can discuss the treatments with you. Together, you can decide which treatment will work best for you. Your treatment choices may include:   Hormone therapy (estrogen and progesterone).  Non-hormonal medicines.  Treating the individual symptoms with medicine (for example antidepressants for depression).  Herbal medicines that may help specific symptoms.  Counseling by a psychiatrist or psychologist.  Group therapy.  Lifestyle changes including:  Eating healthy.  Regular exercise.  Limiting caffeine and alcohol.  Stress management and  meditation.  No treatment. HOME CARE INSTRUCTIONS   Take the medicine your health care provider gives you as directed.  Get plenty of sleep and rest.  Exercise regularly.  Eat a diet that contains calcium (good for the bones) and soy products (acts like estrogen hormone).  Avoid alcoholic beverages.  Do not smoke.  If you have hot flashes, dress in layers.  Take supplements, calcium, and vitamin D to strengthen bones.  You can use over-the-counter lubricants or moisturizers for vaginal dryness.  Group therapy is sometimes very helpful.  Acupuncture may be helpful in some cases. SEEK MEDICAL CARE IF:   You are not sure you are in menopause.  You are having menopausal symptoms and need advice and treatment.  You are still having menstrual periods after age 7 years.  You have pain with intercourse.  Menopause is complete (no menstrual period for 12 months) and you develop vaginal bleeding.  You need a referral to  a specialist (gynecologist, psychiatrist, or psychologist) for treatment. SEEK IMMEDIATE MEDICAL CARE IF:   You have severe depression.  You have excessive vaginal bleeding.  You fell and think you have a broken bone.  You have pain when you urinate.  You develop leg or chest pain.  You have a fast pounding heart beat (palpitations).  You have severe headaches.  You develop vision problems.  You feel a lump in your breast.  You have abdominal pain or severe indigestion.   This information is not intended to replace advice given to you by your health care provider. Make sure you discuss any questions you have with your health care provider.   Document Released: 08/05/2003 Document Revised: 01/15/2013 Document Reviewed: 12/12/2012 Elsevier Interactive Patient Education [employer].

## 2015-04-01 NOTE — Progress Notes (Addendum)
   Patient presented to the office to discuss her ultrasound. Patient was seen the office as a new patient to the practice on October of this year. She had been complaining of dyspareunia and chronic pelvic pain. Patient reports having had an endometrial ablation several years ago. Patient has not had a menstrual cycle in over a year now. She has been complaining also of hot flashes, irritability, mood swing, fatigue and tiredness. Last office visit the following labs were ordered:  [facility] elevated 97.2 TSH 1.084 normal  Patient's PCP has been doing her blood work.  Patient states she has difficulty at night with night sweats and wakes her up. This is contributing to her insomnia. We had a lengthy discussion today on the menopause as well as oneself initiative study. We discussed the risk benefits and pros and cons of hormone replacement therapy. We discussed potential risk of DVT, pulmonary embolism and the risk of breast cancer. Literature information was provided as well as the online website to the [employer] for additional information. We are going to repeat today the [facility]. Because of her strong family history diabetes were by her brother and mother have been diagnosed with diabetes 100 check a hemoglobin A1c today and because of her symptoms. She does have history of hypothyroidism which her PCP has been monitoring but we will check a TSH today as well. Also because of her muscle fatigue and tiredness with 1 to check a vitamin D level as well.  Patient will return back to the office if her symptoms worsen and if once again  her [facility] on the second time continues to demonstrate that it is in the menopausal range she may want to consider hormonal replacement therapy. We discussed different treatment options to include oral versus transdermal patches versus transdermal gel versus spray.  Today's ultrasound demonstrated the following: Uterus measures 6.6 x 4.5 x 3.3 cm within reach or  strep 7.4 mm. fundal fibroid measuring 12 x 11 mm was noted with a solid focus 9 x 13 mm avascular. Right and left ovary were normal. No apparent adnexal masses.  Over 50% time was spent with this patient discussing diagnosis as well as treatment of the menopause.

## 2015-04-02 LAB — VITAMIN D 25 HYDROXY (VIT D DEFICIENCY, FRACTURES): Vit D, 25-Hydroxy: 51 ng/mL (ref 30–100)

## 2015-04-19 ENCOUNTER — Ambulatory Visit: Payer: BLUE CROSS/BLUE SHIELD | Admitting: Sports Medicine

## 2015-05-10 ENCOUNTER — Ambulatory Visit
Admission: RE | Admit: 2015-05-10 | Discharge: 2015-05-10 | Disposition: A | Discharge status: Home / Self Care | Payer: BLUE CROSS/BLUE SHIELD | Source: Ambulatory Visit

## 2015-05-10 DIAGNOSIS — Z1231 Encounter for screening mammogram for malignant neoplasm of breast: Secondary | ICD-10-CM

## 2015-08-10 ENCOUNTER — Emergency Department (HOSPITAL_BASED_OUTPATIENT_CLINIC_OR_DEPARTMENT_OTHER)
Admission: EM | Admit: 2015-08-10 | Discharge: 2015-08-10 | Disposition: A | Discharge status: Home / Self Care | Payer: BLUE CROSS/BLUE SHIELD | Attending: Emergency Medicine | Admitting: Emergency Medicine

## 2015-08-10 ENCOUNTER — Ambulatory Visit: Payer: BLUE CROSS/BLUE SHIELD | Admitting: Gynecology

## 2015-08-10 ENCOUNTER — Encounter (HOSPITAL_BASED_OUTPATIENT_CLINIC_OR_DEPARTMENT_OTHER): Payer: Self-pay | Admitting: *Deleted

## 2015-08-10 ENCOUNTER — Emergency Department (HOSPITAL_BASED_OUTPATIENT_CLINIC_OR_DEPARTMENT_OTHER): Payer: BLUE CROSS/BLUE SHIELD

## 2015-08-10 DIAGNOSIS — G43109 Migraine with aura, not intractable, without status migrainosus: Secondary | ICD-10-CM | POA: Diagnosis not present

## 2015-08-10 DIAGNOSIS — D509 Iron deficiency anemia, unspecified: Secondary | ICD-10-CM | POA: Insufficient documentation

## 2015-08-10 DIAGNOSIS — Z8742 Personal history of other diseases of the female genital tract: Secondary | ICD-10-CM | POA: Insufficient documentation

## 2015-08-10 DIAGNOSIS — Z880 Allergy status to penicillin: Secondary | ICD-10-CM | POA: Diagnosis not present

## 2015-08-10 DIAGNOSIS — F329 Major depressive disorder, single episode, unspecified: Secondary | ICD-10-CM | POA: Diagnosis not present

## 2015-08-10 DIAGNOSIS — N200 Calculus of kidney: Secondary | ICD-10-CM | POA: Insufficient documentation

## 2015-08-10 DIAGNOSIS — Z79899 Other long term (current) drug therapy: Secondary | ICD-10-CM | POA: Insufficient documentation

## 2015-08-10 DIAGNOSIS — R109 Unspecified abdominal pain: Secondary | ICD-10-CM | POA: Diagnosis present

## 2015-08-10 LAB — BASIC METABOLIC PANEL
Anion gap: 8 (ref 5–15)
BUN: 18 mg/dL (ref 6–20)
CO2: 23 mmol/L (ref 22–32)
Calcium: 8.9 mg/dL (ref 8.9–10.3)
Chloride: 107 mmol/L (ref 101–111)
Creatinine, Ser: 0.74 mg/dL (ref 0.44–1.00)
GFR calc Af Amer: >60 mL/min (ref >60)
GFR calc non Af Amer: >60 mL/min (ref >60)
Glucose, Bld: 96 mg/dL (ref 65–99)
Potassium: 3.5 mmol/L (ref 3.5–5.1)
Sodium: 138 mmol/L (ref 135–145)

## 2015-08-10 LAB — URINE MICROSCOPIC-ADD ON

## 2015-08-10 LAB — URINALYSIS, ROUTINE W REFLEX MICROSCOPIC
Bilirubin Urine: NEGATIVE
Glucose, UA: NEGATIVE mg/dL
Ketones, ur: NEGATIVE mg/dL
Nitrite: NEGATIVE
Protein, ur: NEGATIVE mg/dL
Specific Gravity, Urine: 1.017 (ref 1.005–1.030)
pH: 5.5 (ref 5.0–8.0)

## 2015-08-10 LAB — CBC WITH DIFFERENTIAL/PLATELET
Basophils Absolute: 0.1 10*3/uL (ref 0.0–0.1)
Basophils Relative: 1 %
Eosinophils Absolute: 0.1 10*3/uL (ref 0.0–0.7)
Eosinophils Relative: 1 %
HCT: 39.8 % (ref 36.0–46.0)
Hemoglobin: 13.6 g/dL (ref 12.0–15.0)
Lymphocytes Relative: 22 %
Lymphs Abs: 1.7 10*3/uL (ref 0.7–4.0)
MCH: 29.2 pg (ref 26.0–34.0)
MCHC: 34.2 g/dL (ref 30.0–36.0)
MCV: 85.4 fL (ref 78.0–100.0)
Monocytes Absolute: 0.6 10*3/uL (ref 0.1–1.0)
Monocytes Relative: 8 %
Neutro Abs: 5.4 10*3/uL (ref 1.7–7.7)
Neutrophils Relative %: 68 %
Platelets: 314 10*3/uL (ref 150–400)
RBC: 4.66 MIL/uL (ref 3.87–5.11)
RDW: 13.5 % (ref 11.5–15.5)
WBC: 7.9 10*3/uL (ref 4.0–10.5)

## 2015-08-10 MED ORDER — OXYCODONE-ACETAMINOPHEN 5-325 MG PO TABS: 1.0000 | ORAL_TABLET | Freq: Four times a day (QID) | ORAL | Status: DC | PRN

## 2015-08-10 MED ORDER — TAMSULOSIN HCL 0.4 MG PO CAPS: 0.4000 mg | ORAL_CAPSULE | Freq: Every day | ORAL | Status: DC

## 2015-08-10 MED ORDER — ONDANSETRON HCL 4 MG PO TABS: 4.0000 mg | ORAL_TABLET | Freq: Four times a day (QID) | ORAL | Status: DC

## 2015-08-10 MED FILL — TAMSULOSIN HCL 0.4 MG CAP: 0.4 | 30 days supply | Qty: 30 | Fill #0

## 2015-08-10 MED FILL — ONDANSETRON HCL 4 MG TABLET: 4 | 3 days supply | Qty: 12 | Fill #0

## 2015-08-10 MED FILL — OXYCODONE/APAP 5-325: 5-325 | 2 days supply | Qty: 20 | Fill #0

## 2015-08-10 NOTE — ED Provider Notes (Signed)
CSN: [id number]     Arrival date & time 08/10/15  1405 History   First MD Initiated Contact with Patient 08/10/15 1416     Chief Complaint  Patient presents with  . Flank Pain     (Consider location/radiation/quality/duration/timing/severity/associated sxs/prior Treatment) HPI Comments: Patient presents today with a chief complaint of right flank pain.  She reports acute onset of sharp,severe, right flank pain that occurred approximately one hour prior to arrival.  She states that the pain lasted approximately 20 minutes and then resolved.  She denies any pain at this time.  Pain did not radiate.  She did not take anything for pain prior to arrival.  She reports that she has had urinary urgency and difficulty urinating, but denies dysuria, urinary frequency, or hematuria.  She denies fever, chills, nausea, vomiting, diarrhea, abdominal pain, or vaginal discharge.  She denies history of Pyelonephritis or kidney stones.  The history is provided by the patient.    Past Medical History  Diagnosis Date  . Anxiety   . Depression   . Abnormal Pap smear   . Anemia     iron deficient  . Breast disorder     precencerous lesion, marker placed  . Migraine headache with aura   . Menorrhagia    Past Surgical History  Procedure Laterality Date  . Tubal ligation  1998  . Laparoscopic cholecystectomy  2008  . Endometrial ablation  2010  . Augmentation mammaplasty Bilateral 2009  . Breast biopsy Left 2010    precancerous bx  . Abdominoplasty  2010  . Cryotherapy      cervix-3x   Family History  Problem Relation Age of Onset  . Hypertension Mother   . Diabetes Mother 32    Type 2  . Anesthesia problems Mother     anesthetic complication at hysterectomy  . Hypertension Sister   . Diabetes Brother    Social History  Substance Use Topics  . Smoking status: Never Smoker   . Smokeless tobacco: None  . Alcohol Use: 1.2 oz/week    2 Standard drinks or equivalent per week     Comment: occ  wine    OB History    Gravida Para Term Preterm AB TAB SAB Ectopic Multiple Living   3 3 3       3      Review of Systems  All other systems reviewed and are negative.     Allergies  Adhesive; Lortab; and Penicillins  Home Medications   Prior to Admission medications   Medication Sig Start Date End Date Taking? Authorizing Provider  Celecoxib (CELEBREX PO) Take by mouth.    Historical Provider, MD  Ibuprofen (ADVIL PO) Take by mouth as needed.     Historical Provider, MD  IRON PO Take by mouth.    Historical Provider, MD  magnesium 30 MG tablet Take 30 mg by mouth 2 (two) times daily.    Historical Provider, MD  Multiple Vitamin (MULTIVITAMIN) tablet Take 1 tablet by mouth daily.    Historical Provider, MD  oxyCODONE-acetaminophen (PERCOCET) 5-325 MG per tablet Take 1-2 tablets by mouth every 6 (six) hours as needed for severe pain. Patient not taking: Reported on 03/11/2015 01/10/14   Carman Ching, PA-C  Thyroid 48.75 MG TABS Take by mouth.    Historical Provider, MD  topiramate (TOPAMAX) 100 MG tablet Take 100 mg by mouth 2 (two) times daily.    Historical Provider, MD  venlafaxine XR (EFFEXOR XR) 37.5 MG 24 hr capsule  Take 1 capsule (37.5 mg total) by mouth daily. Patient not taking: Reported on 03/11/2015 11/15/12   Elveria Rising, MD  vitamin B-12 (CYANOCOBALAMIN) 100 MCG tablet Take 100 mcg by mouth daily.    Historical Provider, MD   BP 128/73 mmHg  Pulse 74  Temp(Src) 98 F (36.7 C) (Oral)  Resp 16  Ht 5\' 4"  (1.626 m)  Wt 73.029 kg  BMI 27.62 kg/m2  SpO2 100% Physical Exam  Constitutional: She appears well-developed and well-nourished.  HENT:  Head: Normocephalic and atraumatic.  Mouth/Throat: Oropharynx is clear and moist.  Neck: Normal range of motion. Neck supple.  Cardiovascular: Normal rate, regular rhythm and normal heart sounds.   Pulmonary/Chest: Effort normal and breath sounds normal.  Abdominal: Soft. Bowel sounds are normal. She exhibits no distension  and no mass. There is no tenderness. There is no rebound and no guarding.  Mild right CVA tenderness  Neurological: She is alert.  Skin: Skin is warm and dry.  Psychiatric: She has a normal mood and affect.  Nursing note and vitals reviewed.   ED Course  Procedures (including critical care time) Labs Review Labs Reviewed  URINALYSIS, ROUTINE W REFLEX MICROSCOPIC (NOT AT [facility])    Imaging Review No results found. I have personally reviewed and evaluated these images and lab results as part of my medical decision-making.   EKG Interpretation None      MDM   Final diagnoses:  None  Pt has been diagnosed with a Kidney Stone via CT. There is no evidence of significant hydronephrosis, serum creatine WNL, vitals sign stable and the pt does not have irratractable vomiting. Pain controlled in the ED.  No evidence of UTI.  Pt will be dc home with Percocet, Zofran, and Flomax.  Patient given referral to Urology.  Return precautions given.           Hyman Bible, PA-C 08/10/15 1608  Veryl Speak, MD 08/12/15 [phone]

## 2015-08-10 NOTE — ED Notes (Signed)
Right flank pain. Urgency for a week prior to the pain.

## 2015-08-10 NOTE — ED Notes (Signed)
Patient transported to CT 

## 2015-08-10 NOTE — ED Notes (Signed)
Pt given d/c instructions. Verbalizes understanding. No questions. Rx x 3

## 2015-08-12 LAB — URINE CULTURE: Special Requests: NORMAL

## 2016-03-06 ENCOUNTER — Ambulatory Visit: Payer: BLUE CROSS/BLUE SHIELD | Admitting: Podiatry

## 2016-03-13 ENCOUNTER — Encounter: Payer: BLUE CROSS/BLUE SHIELD | Admitting: Gynecology

## 2016-03-22 ENCOUNTER — Encounter: Payer: BLUE CROSS/BLUE SHIELD | Admitting: Gynecology

## 2016-03-22 DIAGNOSIS — Z0289 Encounter for other administrative examinations: Secondary | ICD-10-CM

## 2016-05-03 ENCOUNTER — Other Ambulatory Visit: Payer: Self-pay | Admitting: Gynecology

## 2016-05-03 DIAGNOSIS — Z1231 Encounter for screening mammogram for malignant neoplasm of breast: Secondary | ICD-10-CM

## 2016-05-30 ENCOUNTER — Encounter: Payer: BLUE CROSS/BLUE SHIELD | Admitting: Gynecology

## 2016-05-30 DIAGNOSIS — Z0289 Encounter for other administrative examinations: Secondary | ICD-10-CM

## 2016-06-08 ENCOUNTER — Ambulatory Visit
Admission: RE | Admit: 2016-06-08 | Discharge: 2016-06-08 | Disposition: A | Discharge status: Home / Self Care | Payer: 59 | Source: Ambulatory Visit | Attending: Gynecology | Admitting: Gynecology

## 2016-06-08 DIAGNOSIS — Z1231 Encounter for screening mammogram for malignant neoplasm of breast: Secondary | ICD-10-CM

## 2016-10-11 ENCOUNTER — Encounter: Payer: Self-pay | Admitting: Gynecology

## 2016-12-11 ENCOUNTER — Telehealth: Payer: Self-pay | Admitting: Family Medicine

## 2017-05-10 ENCOUNTER — Other Ambulatory Visit: Payer: Self-pay | Admitting: Gynecology

## 2017-05-10 DIAGNOSIS — Z1231 Encounter for screening mammogram for malignant neoplasm of breast: Secondary | ICD-10-CM

## 2017-06-11 ENCOUNTER — Ambulatory Visit: Payer: 59

## 2017-06-20 ENCOUNTER — Ambulatory Visit: Payer: 59 | Admitting: Gynecology

## 2017-06-20 ENCOUNTER — Encounter: Payer: Self-pay | Admitting: Gynecology

## 2017-06-20 VITALS — BP 118/74 | Ht 64.0 in | Wt 161.0 lb

## 2017-06-20 DIAGNOSIS — Z01419 Encounter for gynecological examination (general) (routine) without abnormal findings: Secondary | ICD-10-CM

## 2017-06-20 DIAGNOSIS — Z79890 Hormone replacement therapy (postmenopausal): Secondary | ICD-10-CM

## 2017-06-20 NOTE — Progress Notes (Signed)
    Lydia Jenkins 11-20-1970 [id number]        47 y.o.  [id number] for annual gynecologic exam.  Former patient of Dr. Toney Rakes.  Doing well without complaints.  Past medical history,surgical history, problem list, medications, allergies, family history and social history were all reviewed and documented as reviewed in the EPIC chart.  ROS:  Performed with pertinent positives and negatives included in the history, assessment and plan.   Additional significant findings : None   Exam: Caryn Bee assistant Vitals:   06/20/17 1617  BP: 118/74  Weight: 161 lb (73 kg)  Height: 5\' 4"  (1.626 m)   Body mass index is 27.64 kg/m.  General appearance:  Normal affect, orientation and appearance. Skin: Grossly normal HEENT: Without gross lesions.  No cervical or supraclavicular adenopathy. Thyroid normal.  Lungs:  Clear without wheezing, rales or rhonchi Cardiac: RR, without RMG Abdominal:  Soft, nontender, without masses, guarding, rebound, organomegaly or hernia Breasts:  Examined lying and sitting without masses, retractions, discharge or axillary adenopathy.  Bilateral implants noted. Pelvic:  Ext, BUS, Vagina: Normal  Cervix: Normal  Uterus: Anteverted, normal size, shape and contour, midline and mobile nontender   Adnexa: Without masses or tenderness    Anus and perineum: Normal   Rectovaginal: Normal sphincter tone without palpated masses or tenderness.    Assessment/Plan:  47 y.o. [id number] female for annual gynecologic exam without menses status post endometrial ablation in the past, tubal sterilization contraception.   1. Postmenopausal/HRT.  History of endometrial ablation and has been amenorrheic since then.  Developed menopausal symptoms and had [place] checked which was elevated in the past.  Ultimately saw integrative health and was started on HRT to include estradiol 0.05 mg patch, Prometrium 100 mg daily and testosterone 2% cream daily which she just started several weeks ago.   Has no bleeding.  Feels good on the medications.  I reviewed HRT with her in detail to include risks versus benefits.  Risks to include thrombosis such as stroke heart attack DVT in the breast cancer issue was reviewed.  Testosterone effects and risks to include acne hair growth weight gain adverse lipid profile liver abnormalities all reviewed.  Benefits to include symptom relief as well as cardiovascular and bone health when started early was also discussed.  Benefits of transdermal absorption also reviewed.  She will continue to follow-up with integrative health for her HRT management but I did stress the need for follow-up if she has any bleeding. 2. Mammography due now and I reminded her to schedule this.  Breast exam normal today. 3. Pap smear/HPV 02/2015.  No Pap smear done today.  History of cryosurgery over 20 years ago with normal Pap smears reported since.  Plan repeat Pap smear at 5-year interval per current screening guidelines. 4. Health maintenance.  No routine lab work done as patient does this elsewhere.  Follow-up in 1 year, sooner as needed.   Anastasio Auerbach MD, 4:46 PM 06/20/2017

## 2017-06-20 NOTE — Patient Instructions (Signed)
Follow-up in 1 year for annual exam, sooner if any issues. 

## 2017-07-27 ENCOUNTER — Ambulatory Visit: Payer: 59

## 2017-12-06 ENCOUNTER — Other Ambulatory Visit: Payer: Self-pay

## 2017-12-06 ENCOUNTER — Inpatient Hospital Stay (HOSPITAL_COMMUNITY)
Admission: EM | Admit: 2017-12-06 | Discharge: 2017-12-11 | DRG: 445 | Disposition: A | Discharge status: Home / Self Care | Payer: 59 | Attending: Family Medicine | Admitting: Family Medicine

## 2017-12-06 ENCOUNTER — Encounter (HOSPITAL_COMMUNITY): Payer: Self-pay | Admitting: Emergency Medicine

## 2017-12-06 DIAGNOSIS — K449 Diaphragmatic hernia without obstruction or gangrene: Secondary | ICD-10-CM | POA: Diagnosis present

## 2017-12-06 DIAGNOSIS — R1033 Periumbilical pain: Secondary | ICD-10-CM

## 2017-12-06 DIAGNOSIS — R1915 Other abnormal bowel sounds: Secondary | ICD-10-CM

## 2017-12-06 DIAGNOSIS — R52 Pain, unspecified: Secondary | ICD-10-CM

## 2017-12-06 DIAGNOSIS — K2970 Gastritis, unspecified, without bleeding: Secondary | ICD-10-CM | POA: Diagnosis present

## 2017-12-06 DIAGNOSIS — E039 Hypothyroidism, unspecified: Secondary | ICD-10-CM | POA: Diagnosis present

## 2017-12-06 DIAGNOSIS — Z880 Allergy status to penicillin: Secondary | ICD-10-CM

## 2017-12-06 DIAGNOSIS — K839 Disease of biliary tract, unspecified: Secondary | ICD-10-CM

## 2017-12-06 DIAGNOSIS — Q453 Other congenital malformations of pancreas and pancreatic duct: Secondary | ICD-10-CM

## 2017-12-06 DIAGNOSIS — K210 Gastro-esophageal reflux disease with esophagitis: Secondary | ICD-10-CM | POA: Diagnosis present

## 2017-12-06 DIAGNOSIS — R1013 Epigastric pain: Secondary | ICD-10-CM

## 2017-12-06 DIAGNOSIS — K915 Postcholecystectomy syndrome: Secondary | ICD-10-CM | POA: Diagnosis present

## 2017-12-06 DIAGNOSIS — K5900 Constipation, unspecified: Secondary | ICD-10-CM | POA: Diagnosis not present

## 2017-12-06 DIAGNOSIS — Z91048 Other nonmedicinal substance allergy status: Secondary | ICD-10-CM

## 2017-12-06 DIAGNOSIS — R945 Abnormal results of liver function studies: Secondary | ICD-10-CM

## 2017-12-06 DIAGNOSIS — Z888 Allergy status to other drugs, medicaments and biological substances status: Secondary | ICD-10-CM

## 2017-12-06 DIAGNOSIS — K838 Other specified diseases of biliary tract: Secondary | ICD-10-CM

## 2017-12-06 DIAGNOSIS — M549 Dorsalgia, unspecified: Secondary | ICD-10-CM | POA: Diagnosis present

## 2017-12-06 DIAGNOSIS — Z79899 Other long term (current) drug therapy: Secondary | ICD-10-CM

## 2017-12-06 DIAGNOSIS — K831 Obstruction of bile duct: Secondary | ICD-10-CM | POA: Diagnosis not present

## 2017-12-06 DIAGNOSIS — R197 Diarrhea, unspecified: Secondary | ICD-10-CM | POA: Diagnosis present

## 2017-12-06 DIAGNOSIS — R109 Unspecified abdominal pain: Secondary | ICD-10-CM | POA: Diagnosis present

## 2017-12-06 DIAGNOSIS — R7989 Other specified abnormal findings of blood chemistry: Secondary | ICD-10-CM

## 2017-12-06 HISTORY — DX: Other complications of anesthesia, initial encounter: T88.59XA

## 2017-12-06 HISTORY — DX: Other specified postprocedural states: Z98.890

## 2017-12-06 HISTORY — DX: Adverse effect of unspecified anesthetic, initial encounter: T41.45XA

## 2017-12-06 HISTORY — DX: Nausea with vomiting, unspecified: R11.2

## 2017-12-06 HISTORY — DX: Family history of other specified conditions: Z84.89

## 2017-12-06 LAB — URINALYSIS, ROUTINE W REFLEX MICROSCOPIC
Bilirubin Urine: NEGATIVE
Glucose, UA: NEGATIVE mg/dL
Hgb urine dipstick: NEGATIVE
Ketones, ur: 20 mg/dL — AB
Leukocytes, UA: NEGATIVE
Nitrite: NEGATIVE
Protein, ur: NEGATIVE mg/dL
Specific Gravity, Urine: 1.012 (ref 1.005–1.030)
pH: 7 (ref 5.0–8.0)

## 2017-12-06 LAB — CBC
HCT: 43.9 % (ref 36.0–46.0)
Hemoglobin: 15.2 g/dL — ABNORMAL HIGH (ref 12.0–15.0)
MCH: 30 pg (ref 26.0–34.0)
MCHC: 34.6 g/dL (ref 30.0–36.0)
MCV: 86.8 fL (ref 78.0–100.0)
Platelets: 321 10*3/uL (ref 150–400)
RBC: 5.06 MIL/uL (ref 3.87–5.11)
RDW: 12.9 % (ref 11.5–15.5)
WBC: 7.6 10*3/uL (ref 4.0–10.5)

## 2017-12-06 LAB — COMPREHENSIVE METABOLIC PANEL
ALT: 25 U/L (ref 0–44)
AST: 27 U/L (ref 15–41)
Albumin: 4.7 g/dL (ref 3.5–5.0)
Alkaline Phosphatase: 89 U/L (ref 38–126)
Anion gap: 11 (ref 5–15)
BUN: 20 mg/dL (ref 6–20)
CO2: 23 mmol/L (ref 22–32)
Calcium: 9.7 mg/dL (ref 8.9–10.3)
Chloride: 106 mmol/L (ref 98–111)
Creatinine, Ser: 0.77 mg/dL (ref 0.44–1.00)
GFR calc Af Amer: >60 mL/min (ref >60)
GFR calc non Af Amer: >60 mL/min (ref >60)
Glucose, Bld: 101 mg/dL — ABNORMAL HIGH (ref 70–99)
Potassium: 3.6 mmol/L (ref 3.5–5.1)
Sodium: 140 mmol/L (ref 135–145)
Total Bilirubin: 0.9 mg/dL (ref 0.3–1.2)
Total Protein: 8.3 g/dL — ABNORMAL HIGH (ref 6.5–8.1)

## 2017-12-06 LAB — RAPID URINE DRUG SCREEN, HOSP PERFORMED
Amphetamines: NOT DETECTED
Benzodiazepines: NOT DETECTED
Cocaine: NOT DETECTED
Opiates: POSITIVE — AB
Tetrahydrocannabinol: POSITIVE — AB

## 2017-12-06 LAB — DIFFERENTIAL
Basophils Absolute: 0 10*3/uL (ref 0.0–0.1)
Basophils Relative: 0 %
Eosinophils Absolute: 0.1 10*3/uL (ref 0.0–0.7)
Eosinophils Relative: 1 %
Lymphocytes Relative: 24 %
Lymphs Abs: 1.8 10*3/uL (ref 0.7–4.0)
Monocytes Absolute: 0.6 10*3/uL (ref 0.1–1.0)
Monocytes Relative: 8 %
Neutro Abs: 5.1 10*3/uL (ref 1.7–7.7)
Neutrophils Relative %: 67 %

## 2017-12-06 LAB — I-STAT TROPONIN, ED: Troponin i, poc: 0 ng/mL (ref 0.00–0.08)

## 2017-12-06 LAB — I-STAT BETA HCG BLOOD, ED (MC, WL, AP ONLY): I-stat hCG, quantitative: <5 m[IU]/mL (ref <5)

## 2017-12-06 LAB — LIPASE, BLOOD: Lipase: 47 U/L (ref 11–51)

## 2017-12-06 MED ORDER — HYDROMORPHONE HCL 1 MG/ML IJ SOLN
1.0000 mg | Freq: Once | INTRAMUSCULAR | Status: AC
  Administered 2017-12-06: 1 mg via INTRAVENOUS
  Filled 2017-12-06: qty 1

## 2017-12-06 MED ORDER — HYDROMORPHONE HCL 1 MG/ML IJ SOLN
0.5000 mg | Freq: Once | INTRAMUSCULAR | Status: AC
  Administered 2017-12-06: 0.5 mg via INTRAVENOUS
  Filled 2017-12-06: qty 1

## 2017-12-06 MED ORDER — HYDROMORPHONE HCL 1 MG/ML IJ SOLN
0.5000 mg | INTRAMUSCULAR | Status: DC | PRN
  Administered 2017-12-07 (×2): 0.5 mg via INTRAVENOUS
  Filled 2017-12-06 (×2): qty 0.5

## 2017-12-06 MED ORDER — ONDANSETRON HCL 4 MG PO TABS: 4.0000 mg | ORAL_TABLET | Freq: Four times a day (QID) | ORAL | Status: DC | PRN

## 2017-12-06 MED ORDER — LACTATED RINGERS IV SOLN
INTRAVENOUS | Status: DC
  Administered 2017-12-06: 19:00:00 via INTRAVENOUS

## 2017-12-06 MED ORDER — ACETAMINOPHEN 325 MG PO TABS
650.0000 mg | ORAL_TABLET | Freq: Four times a day (QID) | ORAL | Status: DC | PRN
  Administered 2017-12-09: 650 mg via ORAL
  Filled 2017-12-06: qty 2

## 2017-12-06 MED ORDER — HYOSCYAMINE SULFATE 0.125 MG PO TABS
0.2500 mg | ORAL_TABLET | Freq: Once | ORAL | Status: AC
  Administered 2017-12-06: 0.25 mg via ORAL
  Filled 2017-12-06: qty 2

## 2017-12-06 MED ORDER — MAGNESIUM GLUCONATE 500 MG PO TABS
500.0000 mg | ORAL_TABLET | Freq: Every day | ORAL | Status: DC
  Administered 2017-12-06 – 2017-12-10 (×5): 500 mg via ORAL
  Filled 2017-12-06 (×6): qty 1

## 2017-12-06 MED ORDER — SODIUM CHLORIDE 0.9 % IV BOLUS
1000.0000 mL | Freq: Once | INTRAVENOUS | Status: AC
  Administered 2017-12-06: 1000 mL via INTRAVENOUS

## 2017-12-06 MED ORDER — FAMOTIDINE IN NACL 20-0.9 MG/50ML-% IV SOLN
20.0000 mg | Freq: Once | INTRAVENOUS | Status: AC
  Administered 2017-12-06: 20 mg via INTRAVENOUS
  Filled 2017-12-06: qty 50

## 2017-12-06 MED ORDER — DICYCLOMINE HCL 10 MG PO CAPS
20.0000 mg | ORAL_CAPSULE | Freq: Once | ORAL | Status: AC
  Administered 2017-12-06: 20 mg via ORAL
  Filled 2017-12-06: qty 2

## 2017-12-06 MED ORDER — LORAZEPAM 2 MG/ML IJ SOLN
0.5000 mg | Freq: Once | INTRAMUSCULAR | Status: AC
  Administered 2017-12-06: 0.5 mg via INTRAVENOUS
  Filled 2017-12-06: qty 1

## 2017-12-06 MED ORDER — ACETAMINOPHEN 650 MG RE SUPP: 650.0000 mg | Freq: Four times a day (QID) | RECTAL | Status: DC | PRN

## 2017-12-06 MED ORDER — LACTATED RINGERS IV SOLN
INTRAVENOUS | Status: AC
  Administered 2017-12-06 – 2017-12-07 (×3): via INTRAVENOUS

## 2017-12-06 MED ORDER — HYDROMORPHONE HCL 1 MG/ML IJ SOLN: 0.5000 mg | Freq: Once | INTRAMUSCULAR | Status: DC

## 2017-12-06 MED ORDER — ENOXAPARIN SODIUM 40 MG/0.4ML ~~LOC~~ SOLN
40.0000 mg | SUBCUTANEOUS | Status: AC
  Administered 2017-12-07 – 2017-12-09 (×3): 40 mg via SUBCUTANEOUS
  Filled 2017-12-06 (×3): qty 0.4

## 2017-12-06 MED ORDER — LORAZEPAM 2 MG/ML IJ SOLN
1.0000 mg | Freq: Once | INTRAMUSCULAR | Status: AC
  Administered 2017-12-06: 1 mg via INTRAVENOUS
  Filled 2017-12-06: qty 1

## 2017-12-06 MED ORDER — ONDANSETRON HCL 4 MG/2ML IJ SOLN
4.0000 mg | Freq: Four times a day (QID) | INTRAMUSCULAR | Status: DC | PRN
  Administered 2017-12-07 – 2017-12-11 (×11): 4 mg via INTRAVENOUS
  Filled 2017-12-06 (×11): qty 2

## 2017-12-06 MED ORDER — THYROID 30 MG PO TABS
32.5000 mg | ORAL_TABLET | Freq: Every day | ORAL | Status: DC
  Administered 2017-12-08: 30 mg via ORAL
  Filled 2017-12-06 (×3): qty 1

## 2017-12-06 MED ORDER — PROGESTERONE MICRONIZED 100 MG PO CAPS
300.0000 mg | ORAL_CAPSULE | Freq: Every day | ORAL | Status: DC
  Administered 2017-12-06 – 2017-12-10 (×5): 300 mg via ORAL
  Filled 2017-12-06 (×5): qty 3

## 2017-12-06 MED ORDER — ONDANSETRON HCL 4 MG/2ML IJ SOLN
4.0000 mg | Freq: Once | INTRAMUSCULAR | Status: AC
  Administered 2017-12-06: 4 mg via INTRAVENOUS
  Filled 2017-12-06: qty 2

## 2017-12-06 MED ORDER — ESTRADIOL 0.05 MG/24HR TD PTWK
0.0500 mg | MEDICATED_PATCH | TRANSDERMAL | Status: DC
  Administered 2017-12-10: 0.05 mg via TRANSDERMAL
  Filled 2017-12-06: qty 1

## 2017-12-06 MED ORDER — MAGNESIUM 200 MG PO TABS: 400.0000 mg | ORAL_TABLET | Freq: Every day | ORAL | Status: DC

## 2017-12-06 MED ORDER — HYOSCYAMINE SULFATE 0.125 MG SL SUBL
0.2500 mg | SUBLINGUAL_TABLET | Freq: Four times a day (QID) | SUBLINGUAL | Status: DC | PRN
  Administered 2017-12-07 – 2017-12-10 (×8): 0.25 mg via SUBLINGUAL
  Filled 2017-12-06 (×10): qty 2

## 2017-12-06 MED ORDER — GI COCKTAIL ~~LOC~~
30.0000 mL | Freq: Once | ORAL | Status: AC
  Administered 2017-12-06: 30 mL via ORAL
  Filled 2017-12-06: qty 30

## 2017-12-06 MED ORDER — METHYLPREDNISOLONE SODIUM SUCC 125 MG IJ SOLR
60.0000 mg | Freq: Once | INTRAMUSCULAR | Status: AC
  Administered 2017-12-06: 60 mg via INTRAVENOUS
  Filled 2017-12-06: qty 2

## 2017-12-06 MED ORDER — PANTOPRAZOLE SODIUM 40 MG PO TBEC
40.0000 mg | DELAYED_RELEASE_TABLET | Freq: Every day | ORAL | Status: DC
  Administered 2017-12-08 – 2017-12-11 (×4): 40 mg via ORAL
  Filled 2017-12-06 (×5): qty 1

## 2017-12-06 NOTE — H&P (Signed)
History and Physical    Lydia Jenkins JAS:[id number] DOB: 07-03-1970 DOA: 12/06/2017  PCP: Tamala Julian, MD  Patient coming from: Home.  Chief Complaint: Abdominal pain.  HPI: Lydia Jenkins is a 47 y.o. female with history of hiatal hernia, hypothyroidism presents to the ER with complaints of severe periumbilical pain radiating to the back.  Patient's pain started acutely and presented to her urologist with concerns of ureteric stone.  As per the report patient had CT renal stone study done which was negative for anything acute and did show biliary dilation from postcholecystectomy.  Patient then was referred to the ER.  Had some nausea denies any vomiting or diarrhea.  Pain is mostly periumbilical radiating to back.  Denies any fever chills.  ED Course: In the ER patient's gastroenterologist Dr. Benson Norway was consulted.  Dr. Benson Norway advised to give a dose of steroid and hyoscyamine.  Despite which patient was still having severe pain and admitted for further observation.  Review of Systems: As per HPI, rest all negative.   Past Medical History:  Diagnosis Date  . Abnormal Pap smear   . Anemia    iron deficient  . Anxiety   . Breast disorder    precencerous lesion, marker placed  . Depression   . Hiatal hernia   . Menorrhagia   . Migraine headache with aura     Past Surgical History:  Procedure Laterality Date  . ABDOMINOPLASTY  2010  . AUGMENTATION MAMMAPLASTY Bilateral 2009  . BREAST BIOPSY Left 2010   precancerous bx  . CRYOTHERAPY     cervix-3x  . ENDOMETRIAL ABLATION  2010  . LAPAROSCOPIC CHOLECYSTECTOMY  2008  . TUBAL LIGATION  1998     reports that she has never smoked. She has never used smokeless tobacco. She reports that she drinks about 1.2 oz of alcohol per week. She reports that she does not use drugs.  Allergies  Allergen Reactions  . Adhesive [Tape] Other (See Comments)    Blisters  . Lortab [Hydrocodone-Acetaminophen] Itching  . Penicillins Hives    Has  patient had a PCN reaction causing immediate rash, facial/tongue/throat swelling, SOB or lightheadedness with hypotension: No Has patient had a PCN reaction causing severe rash involving mucus membranes or skin necrosis: No Has patient had a PCN reaction that required hospitalization: No Has patient had a PCN reaction occurring within the last 10 years: No If all of the above answers are "NO", then may proceed with Cephalosporin use.     Family History  Problem Relation Age of Onset  . Hypertension Mother   . Diabetes Mother 5       Type 2  . Anesthesia problems Mother        anesthetic complication at hysterectomy  . Hypertension Sister   . Diabetes Brother     Prior to Admission medications   Medication Sig Start Date End Date Taking? Authorizing Provider  Barberry-Oreg Grape-Goldenseal (BERBERINE COMPLEX PO) Take 1 tablet by mouth daily.   Yes [provider]  dexlansoprazole (DEXILANT) 60 MG capsule Take 60 mg by mouth daily.   Yes [provider]  estradiol (VIVELLE-DOT) 0.05 MG/24HR patch Place 1 patch onto the skin 2 (two) times a week.   Yes [provider]  Magnesium 400 MG TABS Take 400 mg by mouth daily.    Yes [provider]  Probiotic Product (PROBIOTIC DAILY PO) Take 1 tablet by mouth daily.   Yes [provider]  progesterone (PROMETRIUM) 100 MG  capsule Take 300 mg by mouth daily.    Yes [provider]  Testosterone Propionate (FIRST-TESTOSTERONE MC) 2 % CREA Place onto the skin.   Yes [provider]  thyroid (NATURE-THROID) 32.5 MG tablet Take 32.5 mg by mouth daily.   Yes [provider]  TURMERIC PO Take 1 tablet by mouth daily.   Yes [provider]    Physical Exam: Vitals:   12/06/17 1800 12/06/17 1845 12/06/17 2048 12/06/17 2103  BP:   111/80 116/70  Pulse: 82 (!) 59 69 75  Resp: 13 11 16 18   Temp:   98 F (36.7 C) 98.2 F (36.8 C)  TempSrc:   Oral Oral  SpO2: 100% 99%  100% 100%  Weight:    67.9 kg (149 lb 11.1 oz)  Height:    5\' 4"  (1.626 m)      Constitutional: Moderately built and nourished. Vitals:   12/06/17 1800 12/06/17 1845 12/06/17 2048 12/06/17 2103  BP:   111/80 116/70  Pulse: 82 (!) 59 69 75  Resp: 13 11 16 18   Temp:   98 F (36.7 C) 98.2 F (36.8 C)  TempSrc:   Oral Oral  SpO2: 100% 99% 100% 100%  Weight:    67.9 kg (149 lb 11.1 oz)  Height:    5\' 4"  (1.626 m)   Eyes: Anicteric no pallor. ENMT: No discharge from the ears eyes nose or mouth. Neck: No mass felt.  No neck rigidity.  No JVD appreciated. Respiratory: No rhonchi or crepitations. Cardiovascular: S1-S2 heard no murmurs appreciated. Abdomen: Mild periumbilical tenderness no guarding or rigidity. Musculoskeletal: No edema.  No joint effusion. Skin: No rash. Neurologic: Alert awake oriented to time place and person.  Moves all extremities. Psychiatric: Appears normal per normal affect.   Labs on Admission: I have personally reviewed following labs and imaging studies  CBC: Recent Labs  Lab 12/06/17 1417  WBC 7.6  NEUTROABS 5.1  HGB 15.2*  HCT 43.9  MCV 86.8  PLT 250   Basic Metabolic Panel: Recent Labs  Lab 12/06/17 1417  NA 140  K 3.6  CL 106  CO2 23  GLUCOSE 101*  BUN 20  CREATININE 0.77  CALCIUM 9.7   GFR: Estimated Creatinine Clearance: 80.6 mL/min (by C-G formula based on SCr of 0.77 mg/dL). Liver Function Tests: Recent Labs  Lab 12/06/17 1417  AST 27  ALT 25  ALKPHOS 89  BILITOT 0.9  PROT 8.3*  ALBUMIN 4.7   Recent Labs  Lab 12/06/17 1417  LIPASE 47   No results for input(s): AMMONIA in the last 168 hours. Coagulation Profile: No results for input(s): INR, PROTIME in the last 168 hours. Cardiac Enzymes: No results for input(s): CKTOTAL, CKMB, CKMBINDEX, TROPONINI in the last 168 hours. BNP (last 3 results) No results for input(s): PROBNP in the last 8760 hours. HbA1C: No results for input(s): HGBA1C in the last 72  hours. CBG: No results for input(s): GLUCAP in the last 168 hours. Lipid Profile: No results for input(s): CHOL, HDL, LDLCALC, TRIG, CHOLHDL, LDLDIRECT in the last 72 hours. Thyroid Function Tests: No results for input(s): TSH, T4TOTAL, FREET4, T3FREE, THYROIDAB in the last 72 hours. Anemia Panel: No results for input(s): VITAMINB12, FOLATE, FERRITIN, TIBC, IRON, RETICCTPCT in the last 72 hours. Urine analysis:    Component Value Date/Time   COLORURINE YELLOW 12/06/2017 [place] 12/06/2017 1548   LABSPEC 1.012 12/06/2017 1548   PHURINE 7.0 12/06/2017 1548   GLUCOSEU NEGATIVE 12/06/2017  [place] 12/06/2017 [place] 12/06/2017 1548   BILIRUBINUR - 03/12/2013 1558   KETONESUR 20 (A) 12/06/2017 1548   PROTEINUR NEGATIVE 12/06/2017 1548   UROBILINOGEN negative 03/12/2013 1558   NITRITE NEGATIVE 12/06/2017 1548   LEUKOCYTESUR NEGATIVE 12/06/2017 1548   Sepsis Labs: @LABRCNTIP (procalcitonin:4,lacticidven:4) )No results found for this or any previous visit (from the past 240 hour(s)).   Radiological Exams on Admission: No results found.    Assessment/Plan Principal Problem:   Abdominal pain Active Problems:   Hypothyroidism    1. Periumbilical abdominal pain cause not clear.  Will keep patient on pain relief medication hyoscine.  I have placed patient on fluids and liquid diet for now.  If pain persists reconsult Dr. Benson Norway patient's gastroenterologist in a.m.  Follow urine drug screen. 2. History of hypothyroidism on thyroid replacement. 3. History of hiatal hernia on PPI.   DVT prophylaxis: Lovenox. Code Status: Full code. Family Communication: Discussed with patient husband at the bedside. Disposition Plan: Home. Consults called: GI. Admission status: Observation.   Rise Patience MD [facility] Pager [phone].  If 7PM-7AM, please contact night-coverage [email or web address] Password [facility]  12/06/2017, 9:29  PM

## 2017-12-06 NOTE — Consult Note (Signed)
Reason for Consult: Periumbilical pain and back pain Referring Physician: ER  Gardenia Phlegm HPI: This is a 47 year old female who is well-known to me for a history of reflux esophagitis that is controlled with Dexilant who presents to the ER from the Urologist with severe periumbilical abdominal pain as well as mid back pain.  Her symptoms started acutely yesterday afternoon when she was in [place].  She was not able to complete her work and she drove home.  The 45 minute drive home was very difficult for her and when she came home she was not able to stand up straight.  The pain was debilitating to the point that it caused her to hunch over.  There was no report of any fever, diarrhea, hematochezia, or melena.  She has a history of renal stones and a friend suggested that she may have a UTI.  Averlee presented to urology today and a noncontrast CT scan was performed with findings of nonobstructing stones in the kidneys bilaterally.  There was no evidence of pancreatitis, but she had mild biliary ductal dilation in a post cholecystectomy state.  Her blood work is normal.  A prior CT scan did show the biliary ductal dilation and there is no significant change.  Morphine was provided for her to undergo the CT scan, but it was not beneficial.  She feels that her periumbilical pain goes straight through to her back and it is a stabbing and twisting sensation.  Past Medical History:  Diagnosis Date  . Abnormal Pap smear   . Anemia    iron deficient  . Anxiety   . Breast disorder    precencerous lesion, marker placed  . Depression   . Hiatal hernia   . Menorrhagia   . Migraine headache with aura     Past Surgical History:  Procedure Laterality Date  . ABDOMINOPLASTY  2010  . AUGMENTATION MAMMAPLASTY Bilateral 2009  . BREAST BIOPSY Left 2010   precancerous bx  . CRYOTHERAPY     cervix-3x  . ENDOMETRIAL ABLATION  2010  . LAPAROSCOPIC CHOLECYSTECTOMY  2008  . TUBAL LIGATION  1998    Family  History  Problem Relation Age of Onset  . Hypertension Mother   . Diabetes Mother 11       Type 2  . Anesthesia problems Mother        anesthetic complication at hysterectomy  . Hypertension Sister   . Diabetes Brother     Social History:  reports that she has never smoked. She has never used smokeless tobacco. She reports that she drinks about 1.2 oz of alcohol per week. She reports that she does not use drugs.  Allergies:  Allergies  Allergen Reactions  . Adhesive [Tape] Other (See Comments)    Blisters  . Lortab [Hydrocodone-Acetaminophen] Itching  . Penicillins Hives    Has patient had a PCN reaction causing immediate rash, facial/tongue/throat swelling, SOB or lightheadedness with hypotension: No Has patient had a PCN reaction causing severe rash involving mucus membranes or skin necrosis: No Has patient had a PCN reaction that required hospitalization: No Has patient had a PCN reaction occurring within the last 10 years: No If all of the above answers are "NO", then may proceed with Cephalosporin use.     Medications:  Scheduled: .  HYDROmorphone (DILAUDID) injection  0.5 mg Intravenous Once  . hyoscyamine  0.25 mg Oral Once  . methylPREDNISolone (SOLU-MEDROL) injection  60 mg Intravenous Once   Continuous:   Results  for orders placed or performed during the hospital encounter of 12/06/17 (from the past 24 hour(s))  Lipase, blood     Status: None   Collection Time: 12/06/17  2:17 PM  Result Value Ref Range   Lipase 47 11 - 51 U/L  Comprehensive metabolic panel     Status: Abnormal   Collection Time: 12/06/17  2:17 PM  Result Value Ref Range   Sodium 140 135 - 145 mmol/L   Potassium 3.6 3.5 - 5.1 mmol/L   Chloride 106 98 - 111 mmol/L   CO2 23 22 - 32 mmol/L   Glucose, Bld 101 (H) 70 - 99 mg/dL   BUN 20 6 - 20 mg/dL   Creatinine, Ser 0.77 0.44 - 1.00 mg/dL   Calcium 9.7 8.9 - 10.3 mg/dL   Total Protein 8.3 (H) 6.5 - 8.1 g/dL   Albumin 4.7 3.5 - 5.0 g/dL    AST 27 15 - 41 U/L   ALT 25 0 - 44 U/L   Alkaline Phosphatase 89 38 - 126 U/L   Total Bilirubin 0.9 0.3 - 1.2 mg/dL   GFR calc non Af Amer >60 >60 mL/min   GFR calc Af Amer >60 >60 mL/min   Anion gap 11 5 - 15  CBC     Status: Abnormal   Collection Time: 12/06/17  2:17 PM  Result Value Ref Range   WBC 7.6 4.0 - 10.5 K/uL   RBC 5.06 3.87 - 5.11 MIL/uL   Hemoglobin 15.2 (H) 12.0 - 15.0 g/dL   HCT 43.9 36.0 - 46.0 %   MCV 86.8 78.0 - 100.0 fL   MCH 30.0 26.0 - 34.0 pg   MCHC 34.6 30.0 - 36.0 g/dL   RDW 12.9 11.5 - 15.5 %   Platelets 321 150 - 400 K/uL  Differential     Status: None   Collection Time: 12/06/17  2:17 PM  Result Value Ref Range   Neutrophils Relative % 67 %   Neutro Abs 5.1 1.7 - 7.7 K/uL   Lymphocytes Relative 24 %   Lymphs Abs 1.8 0.7 - 4.0 K/uL   Monocytes Relative 8 %   Monocytes Absolute 0.6 0.1 - 1.0 K/uL   Eosinophils Relative 1 %   Eosinophils Absolute 0.1 0.0 - 0.7 K/uL   Basophils Relative 0 %   Basophils Absolute 0.0 0.0 - 0.1 K/uL  I-Stat beta hCG blood, ED     Status: None   Collection Time: 12/06/17  2:37 PM  Result Value Ref Range   I-stat hCG, quantitative <5.0 <5 mIU/mL   Comment 3          Urinalysis, Routine w reflex microscopic     Status: Abnormal   Collection Time: 12/06/17  3:48 PM  Result Value Ref Range   Color, Urine YELLOW YELLOW   APPearance CLEAR CLEAR   Specific Gravity, Urine 1.012 1.005 - 1.030   pH 7.0 5.0 - 8.0   Glucose, UA NEGATIVE NEGATIVE mg/dL   Hgb urine dipstick NEGATIVE NEGATIVE   Bilirubin Urine NEGATIVE NEGATIVE   Ketones, ur 20 (A) NEGATIVE mg/dL   Protein, ur NEGATIVE NEGATIVE mg/dL   Nitrite NEGATIVE NEGATIVE   Leukocytes, UA NEGATIVE NEGATIVE  I-stat troponin, ED     Status: None   Collection Time: 12/06/17  4:13 PM  Result Value Ref Range   Troponin i, poc 0.00 0.00 - 0.08 ng/mL   Comment 3             No  results found.  ROS:  As stated above in the HPI otherwise negative.  Blood pressure 111/81,  pulse 77, temperature 98.2 F (36.8 C), temperature source Oral, resp. rate 15, height 5\' 4"  (1.626 m), weight 65.8 kg (145 lb), SpO2 95 %.    PE: Gen: NAD, Alert and Oriented HEENT:  [place]/AT, EOMI Neck: Supple, no LAD Lungs: CTA Bilaterally CV: RRR without M/G/R ABM: Soft, periumbilical tenderness, +BS Ext: No C/C/E  Assessment/Plan: 1) Periumbilical tenderness - She exhibited a positive Carnett's sign.  There was localization of the pain the the abdominal wall to contraction of the abdominal musculature.  Palpation of the flanks also reproduced her back pain.    Plan: 1) Trial of methylprednisolone. 2) Trial of hyoscyamine SL.  Zamoria Boss D 12/06/2017, 5:11 PM

## 2017-12-06 NOTE — ED Provider Notes (Signed)
Assumed care from Dr. Roderic Palau at 4:04 PM. Briefly, the patient is a 47 y.o. female with PMHx of  has a past medical history of Abnormal Pap smear, Anemia, Anxiety, Breast disorder, Depression, Hiatal hernia, Menorrhagia, and Migraine headache with aura. here with epigastric pain. Neg CT at Urology. LFTs, lipase are WNL. Pain severe, so GI consulted - f/u reccs..   Labs Reviewed  COMPREHENSIVE METABOLIC PANEL - Abnormal; Notable for the following components:      Result Value   Glucose, Bld 101 (*)    Total Protein 8.3 (*)    All other components within normal limits  CBC - Abnormal; Notable for the following components:   Hemoglobin 15.2 (*)    All other components within normal limits  LIPASE, BLOOD  DIFFERENTIAL  URINALYSIS, ROUTINE W REFLEX MICROSCOPIC  I-STAT BETA HCG BLOOD, ED ([facility], [facility], [facility] ONLY)  I-STAT TROPONIN, ED    Course of Care: Discussed with Dr. Benson Norway.  Patient given dose of prednisone and hyoscyamine per his recommendations.  I have also give her additional dose of Ativan.  Her pain is down to 5 out of 10 but she is persistently nauseous and unable to eat or drink.  She has received multiple doses of IV analgesia and antiemetics.  At given her persistent pain and difficulty eating and drinking, will admit for fluids and symptomatic control.     Duffy Bruce, MD 12/06/17 [id number]

## 2017-12-06 NOTE — ED Triage Notes (Signed)
Pt complaint of mid abd pain with nausea onset 2330 last night; told to come here for pancreatitis.

## 2017-12-06 NOTE — ED Notes (Signed)
ED TO INPATIENT HANDOFF REPORT  Name/Age/Gender Lydia Jenkins 47 y.o. female  Code Status   Home/SNF/Other Home  Chief Complaint abd pain / back pain   Level of Care/Admitting Diagnosis ED Disposition    ED Disposition Condition [place]: [place] [[id number]]  Level of Care: Med-Surg [16]  Diagnosis: Abdominal pain [160737]  Admitting Physician: Rise Patience [phone]  Attending Physician: Rise Patience [phone]  PT Class (Do Not Modify): Observation [104]  PT Acc Code (Do Not Modify): Observation [10022]       Medical History Past Medical History:  Diagnosis Date  . Abnormal Pap smear   . Anemia    iron deficient  . Anxiety   . Breast disorder    precencerous lesion, marker placed  . Depression   . Hiatal hernia   . Menorrhagia   . Migraine headache with aura     Allergies Allergies  Allergen Reactions  . Adhesive [Tape] Other (See Comments)    Blisters  . Lortab [Hydrocodone-Acetaminophen] Itching  . Penicillins Hives    Has patient had a PCN reaction causing immediate rash, facial/tongue/throat swelling, SOB or lightheadedness with hypotension: No Has patient had a PCN reaction causing severe rash involving mucus membranes or skin necrosis: No Has patient had a PCN reaction that required hospitalization: No Has patient had a PCN reaction occurring within the last 10 years: No If all of the above answers are "NO", then may proceed with Cephalosporin use.     IV Location/Drains/Wounds Patient Lines/Drains/Airways Status   Active Line/Drains/Airways    Name:   Placement date:   Placement time:   Site:   Days:   Peripheral IV 12/06/17 Left Forearm   12/06/17    1416    Forearm   less than 1          Labs/Imaging Results for orders placed or performed during the hospital encounter of 12/06/17 (from the past 48 hour(s))  Lipase, blood     Status: None   Collection Time: 12/06/17  2:17 PM  Result  Value Ref Range   Lipase 47 11 - 51 U/L    Comment: Performed at [facility], [place] [address], [place], [place] [address]  Comprehensive metabolic panel     Status: Abnormal   Collection Time: 12/06/17  2:17 PM  Result Value Ref Range   Sodium 140 135 - 145 mmol/L   Potassium 3.6 3.5 - 5.1 mmol/L   Chloride 106 98 - 111 mmol/L    Comment: Please note change in reference range.   CO2 23 22 - 32 mmol/L   Glucose, Bld 101 (H) 70 - 99 mg/dL    Comment: Please note change in reference range.   BUN 20 6 - 20 mg/dL    Comment: Please note change in reference range.   Creatinine, Ser 0.77 0.44 - 1.00 mg/dL   Calcium 9.7 8.9 - 10.3 mg/dL   Total Protein 8.3 (H) 6.5 - 8.1 g/dL   Albumin 4.7 3.5 - 5.0 g/dL   AST 27 15 - 41 U/L   ALT 25 0 - 44 U/L    Comment: Please note change in reference range.   Alkaline Phosphatase 89 38 - 126 U/L   Total Bilirubin 0.9 0.3 - 1.2 mg/dL   GFR calc non Af Amer >60 >60 mL/min   GFR calc Af Amer >60 >60 mL/min    Comment: (NOTE) The eGFR has been calculated using  the CKD EPI equation. This calculation has not been validated in all clinical situations. eGFR's persistently <60 mL/min signify possible Chronic Kidney Disease.    Anion gap 11 5 - 15    Comment: Performed at [facility], [place] [address] [place], [place] [address]  CBC     Status: Abnormal   Collection Time: 12/06/17  2:17 PM  Result Value Ref Range   WBC 7.6 4.0 - 10.5 K/uL   RBC 5.06 3.87 - 5.11 MIL/uL   Hemoglobin 15.2 (H) 12.0 - 15.0 g/dL   HCT 43.9 36.0 - 46.0 %   MCV 86.8 78.0 - 100.0 fL   MCH 30.0 26.0 - 34.0 pg   MCHC 34.6 30.0 - 36.0 g/dL   RDW 12.9 11.5 - 15.5 %   Platelets 321 150 - 400 K/uL    Comment: Performed at [facility], [place] [address], [place], [place] [address]  Differential     Status: None   Collection Time: 12/06/17  2:17 PM  Result Value Ref Range   Neutrophils Relative % 67 %   Neutro Abs 5.1 1.7 -  7.7 K/uL   Lymphocytes Relative 24 %   Lymphs Abs 1.8 0.7 - 4.0 K/uL   Monocytes Relative 8 %   Monocytes Absolute 0.6 0.1 - 1.0 K/uL   Eosinophils Relative 1 %   Eosinophils Absolute 0.1 0.0 - 0.7 K/uL   Basophils Relative 0 %   Basophils Absolute 0.0 0.0 - 0.1 K/uL    Comment: Performed at [facility], [place] [address], [place], [place] [address]  I-Stat beta hCG blood, ED     Status: None   Collection Time: 12/06/17  2:37 PM  Result Value Ref Range   I-stat hCG, quantitative <5.0 <5 mIU/mL   Comment 3            Comment:   GEST. AGE      CONC.  (mIU/mL)   <=1 WEEK        5 - 50     2 WEEKS       50 - 500     3 WEEKS       100 - 10,000     4 WEEKS     1,000 - 30,000        FEMALE AND NON-PREGNANT FEMALE:     LESS THAN 5 mIU/mL   Urinalysis, Routine w reflex microscopic     Status: Abnormal   Collection Time: 12/06/17  3:48 PM  Result Value Ref Range   Color, Urine YELLOW YELLOW   APPearance CLEAR CLEAR   Specific Gravity, Urine 1.012 1.005 - 1.030   pH 7.0 5.0 - 8.0   Glucose, UA NEGATIVE NEGATIVE mg/dL   Hgb urine dipstick NEGATIVE NEGATIVE   Bilirubin Urine NEGATIVE NEGATIVE   Ketones, ur 20 (A) NEGATIVE mg/dL   Protein, ur NEGATIVE NEGATIVE mg/dL   Nitrite NEGATIVE NEGATIVE   Leukocytes, UA NEGATIVE NEGATIVE    Comment: Performed at [facility], [place] [address], [place], [place] [address]  I-stat troponin, ED     Status: None   Collection Time: 12/06/17  4:13 PM  Result Value Ref Range   Troponin i, poc 0.00 0.00 - 0.08 ng/mL   Comment 3            Comment: Due to the release kinetics of cTnI, a negative result within the first hours of the onset of symptoms does not rule out  myocardial infarction with certainty. If myocardial infarction is still suspected, repeat the test at appropriate intervals.   Rapid urine drug screen (hospital performed)     Status: Abnormal   Collection Time: 12/06/17  7:53 PM  Result Value Ref Range    Opiates POSITIVE (A) NONE DETECTED   Cocaine NONE DETECTED NONE DETECTED   Benzodiazepines NONE DETECTED NONE DETECTED   Amphetamines NONE DETECTED NONE DETECTED   Tetrahydrocannabinol POSITIVE (A) NONE DETECTED   Barbiturates (A) NONE DETECTED    Result not available. Reagent lot number recalled by manufacturer.    Comment: Performed at [facility], [place] [address] [place], [place] [address]   No results found.  Pending Labs Unresulted Labs (From admission, onward)   None      Vitals/Pain Today's Vitals   12/06/17 1734 12/06/17 1736 12/06/17 1800 12/06/17 1845  BP:  (!) 117/93    Pulse:  69 82 (!) 59  Resp: _0 Temp:      TempSrc:      SpO2:  95% 100% 99%  Weight:      Height:      PainSc:        Isolation Precautions No active isolations  Medications Medications  lactated ringers infusion ( Intravenous New Bag/Given 12/06/17 1908)  sodium chloride 0.9 % bolus 1,000 mL (0 mLs Intravenous Stopped 12/06/17 1604)  HYDROmorphone (DILAUDID) injection 1 mg (1 mg Intravenous Given 12/06/17 1414)  ondansetron (ZOFRAN) injection 4 mg (4 mg Intravenous Given 12/06/17 1414)  famotidine (PEPCID) IVPB 20 mg premix (0 mg Intravenous Stopped 12/06/17 1515)  LORazepam (ATIVAN) injection 0.5 mg (0.5 mg Intravenous Given 12/06/17 1501)  HYDROmorphone (DILAUDID) injection 0.5 mg (0.5 mg Intravenous Given 12/06/17 1502)  gi cocktail (Maalox,Lidocaine,Donnatal) (30 mLs Oral Given 12/06/17 1603)  methylPREDNISolone sodium succinate (SOLU-MEDROL) 125 mg/2 mL injection 60 mg (60 mg Intravenous Given 12/06/17 1752)  hyoscyamine (LEVSIN, ANASPAZ) tablet 0.25 mg (0.25 mg Oral Given 12/06/17 1755)  LORazepam (ATIVAN) injection 1 mg (1 mg Intravenous Given 12/06/17 1754)  dicyclomine (BENTYL) capsule 20 mg (20 mg Oral Given 12/06/17 1907)    Mobility walks

## 2017-12-06 NOTE — ED Provider Notes (Addendum)
[facility] DEPT Provider Note   CSN: [id number] Arrival date & time: 12/06/17  1336     History   Chief Complaint Chief Complaint  Patient presents with  . Abdominal Pain    HPI Lydia Jenkins is a 47 y.o. female.  Patient started yesterday with bilateral back pain and 3 bouts of diarrhea.  The pain is moved to her epigastric area and periumbilical.  She was seen by urology today and had a CT scan that shows some kidney stones in her kidneys.  Patient was sent to the emergency department for treatment.  The history is provided by the patient. No language interpreter was used.  Abdominal Pain   This is a new problem. The current episode started 6 to 12 hours ago. The problem occurs constantly. The problem has not changed since onset.The pain is associated with an unknown factor. The pain is located in the generalized abdominal region and epigastric region. The quality of the pain is aching. The pain is at a severity of 10/10. The pain is moderate. Pertinent negatives include diarrhea, flatus, frequency, hematuria and headaches. Nothing aggravates the symptoms. Nothing relieves the symptoms. Past workup includes GI consult. Her past medical history is significant for PUD.    Past Medical History:  Diagnosis Date  . Abnormal Pap smear   . Anemia    iron deficient  . Anxiety   . Breast disorder    precencerous lesion, marker placed  . Depression   . Hiatal hernia   . Menorrhagia   . Migraine headache with aura     Patient Active Problem List   Diagnosis Date Noted  . Night sweats 03/11/2015  . S/P endometrial ablation 03/12/2013  . Premenstrual syndrome 03/12/2013  . Anemia 08/14/2012  . Pelvic pain in female 08/13/2012  . Menorrhagia     Past Surgical History:  Procedure Laterality Date  . ABDOMINOPLASTY  2010  . AUGMENTATION MAMMAPLASTY Bilateral 2009  . BREAST BIOPSY Left 2010   precancerous bx  . CRYOTHERAPY     cervix-3x  .  ENDOMETRIAL ABLATION  2010  . LAPAROSCOPIC CHOLECYSTECTOMY  2008  . TUBAL LIGATION  1998     OB History    Gravida  3   Para  3   Term  3   Preterm      AB      Living  3     SAB      TAB      Ectopic      Multiple      Live Births  3            Home Medications    Prior to Admission medications   Medication Sig Start Date End Date Taking? Authorizing Provider  Barberry-Oreg Grape-Goldenseal (BERBERINE COMPLEX PO) Take 1 tablet by mouth daily.   Yes [provider]  dexlansoprazole (DEXILANT) 60 MG capsule Take 60 mg by mouth daily.   Yes [provider]  estradiol (VIVELLE-DOT) 0.05 MG/24HR patch Place 1 patch onto the skin 2 (two) times a week.   Yes [provider]  Magnesium 400 MG TABS Take 400 mg by mouth daily.    Yes [provider]  Probiotic Product (PROBIOTIC DAILY PO) Take 1 tablet by mouth daily.   Yes [provider]  progesterone (PROMETRIUM) 100 MG capsule Take 300 mg by mouth daily.    Yes [provider]  Testosterone Propionate (FIRST-TESTOSTERONE MC) 2 % CREA Place onto the skin.  Yes [provider]  thyroid (NATURE-THROID) 32.5 MG tablet Take 32.5 mg by mouth daily.   Yes [provider]  TURMERIC PO Take 1 tablet by mouth daily.   Yes [provider]    Family History Family History  Problem Relation Age of Onset  . Hypertension Mother   . Diabetes Mother 7       Type 2  . Anesthesia problems Mother        anesthetic complication at hysterectomy  . Hypertension Sister   . Diabetes Brother     Social History Social History   Tobacco Use  . Smoking status: Never Smoker  . Smokeless tobacco: Never Used  Substance Use Topics  . Alcohol use: Yes    Alcohol/week: 1.2 oz    Types: 2 Standard drinks or equivalent per week    Comment: occ wine   . Drug use: No     Allergies   Adhesive [tape]; Lortab [hydrocodone-acetaminophen]; and  Penicillins   Review of Systems Review of Systems  Constitutional: Negative for appetite change and fatigue.  HENT: Negative for congestion, ear discharge and sinus pressure.   Eyes: Negative for discharge.  Respiratory: Negative for cough.   Cardiovascular: Negative for chest pain.  Gastrointestinal: Positive for abdominal pain. Negative for diarrhea and flatus.  Genitourinary: Positive for flank pain. Negative for frequency and hematuria.  Musculoskeletal: Negative for back pain.  Skin: Negative for rash.  Neurological: Negative for seizures and headaches.  Psychiatric/Behavioral: Negative for hallucinations.     Physical Exam Updated Vital Signs BP 111/81 (BP Location: Left Arm)   Pulse 77   Temp 98.2 F (36.8 C) (Oral)   Resp 15   Ht 5\' 4"  (1.626 m)   Wt 65.8 kg (145 lb)   SpO2 95%   BMI 24.89 kg/m   Physical Exam  Constitutional: She is oriented to person, place, and time. She appears well-developed.  HENT:  Head: Normocephalic.  Eyes: Conjunctivae and EOM are normal. No scleral icterus.  Neck: Neck supple. No thyromegaly present.  Cardiovascular: Normal rate and regular rhythm. Exam reveals no gallop and no friction rub.  No murmur heard. Pulmonary/Chest: No stridor. She has no wheezes. She has no rales. She exhibits no tenderness.  Abdominal: She exhibits no distension. There is tenderness. There is no rebound.  Musculoskeletal: Normal range of motion. She exhibits no edema.  Lymphadenopathy:    She has no cervical adenopathy.  Neurological: She is oriented to person, place, and time. She exhibits normal muscle tone. Coordination normal.  Skin: No rash noted. No erythema.  Psychiatric: She has a normal mood and affect. Her behavior is normal.     ED Treatments / Results  Labs (all labs ordered are listed, but only abnormal results are displayed) Labs Reviewed  COMPREHENSIVE METABOLIC PANEL - Abnormal; Notable for the following components:      Result  Value   Glucose, Bld 101 (*)    Total Protein 8.3 (*)    All other components within normal limits  CBC - Abnormal; Notable for the following components:   Hemoglobin 15.2 (*)    All other components within normal limits  LIPASE, BLOOD  DIFFERENTIAL  URINALYSIS, ROUTINE W REFLEX MICROSCOPIC  I-STAT BETA HCG BLOOD, ED ([facility], [facility], [facility] ONLY)    EKG None  Radiology No results found.  Procedures Procedures (including critical care time)  Medications Ordered in ED Medications  gi cocktail (Maalox,Lidocaine,Donnatal) (has no administration in time range)  sodium chloride 0.9 %  bolus 1,000 mL (1,000 mLs Intravenous New Bag/Given 12/06/17 1414)  HYDROmorphone (DILAUDID) injection 1 mg (1 mg Intravenous Given 12/06/17 1414)  ondansetron (ZOFRAN) injection 4 mg (4 mg Intravenous Given 12/06/17 1414)  famotidine (PEPCID) IVPB 20 mg premix (20 mg Intravenous New Bag/Given 12/06/17 1414)  LORazepam (ATIVAN) injection 0.5 mg (0.5 mg Intravenous Given 12/06/17 1501)  HYDROmorphone (DILAUDID) injection 0.5 mg (0.5 mg Intravenous Given 12/06/17 1502)     Initial Impression / Assessment and Plan / ED Course  I have reviewed the triage vital signs and the nursing notes.  Pertinent labs & imaging results that were available during my care of the patient were reviewed by me and considered in my medical decision making (see chart for details).  Clinical Course as of Dec 14 1040  Thu Dec 06, 2017  1601 Assumed care from Dr. Roderic Palau at 4 PM.  Briefly, 47 year old female here with epigastric abdominal pain.  History of hiatal hernia.  Dr. Almyra Free with GI will see the patient in the ED.  Pain somewhat improving, but has been fairly severe in the ED.   [CI]  [place], EKG ordered - will follow-up.   [CI]    Clinical Course User Index [CI] Duffy Bruce, MD    Patient with significant epigastric pain.  She has been given 1/2 mg Dilaudid and 1/2 mg of Ativan along with some Pepcid IV and her pain has  improved from a 10 down to 5.  I spoke with her GI doctor and he suggested a GI cocktail and will come see the patient.  Final Clinical Impressions(s) / ED Diagnoses   Final diagnoses:  None    ED Discharge Orders    None       Milton Ferguson, MD 12/06/17 6629    Milton Ferguson, MD 12/14/17 1042

## 2017-12-06 NOTE — ED Notes (Signed)
Pt reports abdominal pain umbilical region and to bilateral lower back that started yesterday. Pt denies pain and burning with urination.

## 2017-12-07 ENCOUNTER — Other Ambulatory Visit: Payer: Self-pay

## 2017-12-07 DIAGNOSIS — R1013 Epigastric pain: Secondary | ICD-10-CM | POA: Diagnosis not present

## 2017-12-07 DIAGNOSIS — E039 Hypothyroidism, unspecified: Secondary | ICD-10-CM

## 2017-12-07 LAB — HIV ANTIBODY (ROUTINE TESTING W REFLEX): HIV Screen 4th Generation wRfx: NONREACTIVE

## 2017-12-07 LAB — CBC WITH DIFFERENTIAL/PLATELET
Basophils Absolute: 0 10*3/uL (ref 0.0–0.1)
Basophils Relative: 0 %
Eosinophils Absolute: 0 10*3/uL (ref 0.0–0.7)
Eosinophils Relative: 0 %
HCT: 38.1 % (ref 36.0–46.0)
Hemoglobin: 13.1 g/dL (ref 12.0–15.0)
Lymphocytes Relative: 7 %
Lymphs Abs: 0.8 10*3/uL (ref 0.7–4.0)
MCH: 30 pg (ref 26.0–34.0)
MCHC: 34.4 g/dL (ref 30.0–36.0)
MCV: 87.4 fL (ref 78.0–100.0)
Monocytes Absolute: 0.7 10*3/uL (ref 0.1–1.0)
Monocytes Relative: 6 %
Neutro Abs: 10.4 10*3/uL — ABNORMAL HIGH (ref 1.7–7.7)
Neutrophils Relative %: 87 %
Platelets: 263 10*3/uL (ref 150–400)
RBC: 4.36 MIL/uL (ref 3.87–5.11)
RDW: 12.8 % (ref 11.5–15.5)
WBC: 11.9 10*3/uL — ABNORMAL HIGH (ref 4.0–10.5)

## 2017-12-07 LAB — HEPATIC FUNCTION PANEL
ALT: 92 U/L — ABNORMAL HIGH (ref 0–44)
AST: 71 U/L — ABNORMAL HIGH (ref 15–41)
Albumin: 3.5 g/dL (ref 3.5–5.0)
Alkaline Phosphatase: 88 U/L (ref 38–126)
Bilirubin, Direct: 0.1 mg/dL (ref 0.0–0.2)
Indirect Bilirubin: 0.6 mg/dL (ref 0.3–0.9)
Total Bilirubin: 0.7 mg/dL (ref 0.3–1.2)
Total Protein: 6.3 g/dL — ABNORMAL LOW (ref 6.5–8.1)

## 2017-12-07 LAB — BASIC METABOLIC PANEL
Anion gap: 7 (ref 5–15)
BUN: 14 mg/dL (ref 6–20)
CO2: 25 mmol/L (ref 22–32)
Calcium: 9.2 mg/dL (ref 8.9–10.3)
Chloride: 108 mmol/L (ref 98–111)
Creatinine, Ser: 0.57 mg/dL (ref 0.44–1.00)
GFR calc Af Amer: >60 mL/min (ref >60)
GFR calc non Af Amer: >60 mL/min (ref >60)
Glucose, Bld: 126 mg/dL — ABNORMAL HIGH (ref 70–99)
Potassium: 4.3 mmol/L (ref 3.5–5.1)
Sodium: 140 mmol/L (ref 135–145)

## 2017-12-07 LAB — TSH: TSH: 2.294 u[IU]/mL (ref 0.350–4.500)

## 2017-12-07 MED ORDER — DIPHENHYDRAMINE HCL 25 MG PO CAPS
25.0000 mg | ORAL_CAPSULE | Freq: Four times a day (QID) | ORAL | Status: DC | PRN
  Administered 2017-12-07 – 2017-12-10 (×5): 25 mg via ORAL
  Filled 2017-12-07 (×6): qty 1

## 2017-12-07 MED ORDER — MORPHINE SULFATE (PF) 2 MG/ML IV SOLN
2.0000 mg | INTRAVENOUS | Status: DC | PRN
  Administered 2017-12-07 – 2017-12-08 (×4): 2 mg via INTRAVENOUS
  Filled 2017-12-07 (×4): qty 1

## 2017-12-07 MED ORDER — OXYCODONE-ACETAMINOPHEN 5-325 MG PO TABS
1.0000 | ORAL_TABLET | ORAL | Status: DC | PRN
Start: 2017-12-07 — End: 2017-12-08
  Administered 2017-12-07: 2 via ORAL
  Filled 2017-12-07: qty 2

## 2017-12-07 MED ORDER — GI COCKTAIL ~~LOC~~
30.0000 mL | Freq: Once | ORAL | Status: AC
  Administered 2017-12-07: 30 mL via ORAL
  Filled 2017-12-07: qty 30

## 2017-12-07 MED ORDER — OXYCODONE-ACETAMINOPHEN 5-325 MG PO TABS
1.0000 | ORAL_TABLET | ORAL | Status: DC | PRN
  Administered 2017-12-07: 1 via ORAL
  Filled 2017-12-07: qty 1

## 2017-12-07 MED ORDER — DIPHENHYDRAMINE HCL 25 MG PO CAPS
25.0000 mg | ORAL_CAPSULE | Freq: Once | ORAL | Status: AC
  Administered 2017-12-07: 25 mg via ORAL
  Filled 2017-12-07: qty 1

## 2017-12-07 MED ORDER — METHYLPREDNISOLONE 2 MG PO TABS
20.0000 mg | ORAL_TABLET | Freq: Once | ORAL | Status: AC
  Administered 2017-12-07: 20 mg via ORAL
  Filled 2017-12-07: qty 2

## 2017-12-07 NOTE — Progress Notes (Signed)
PROGRESS NOTE    Lydia Jenkins  PPI:[id number] DOB: 26-Dec-1970 DOA: 12/06/2017 PCP: Tamala Julian, MD   Brief Narrative: Lydia Jenkins is a 47 y.o. female with history of hiatal hernia, hypothyroidism.    Assessment & Plan:   Principal Problem:   Abdominal pain Active Problems:   Hypothyroidism   Abdominal pain Epigastric. Unclear etiology. Possibly gastritis. Less likely pancreatitis. Improved from yesterday. -GI recommendations: steroids, hyoscine -GI cocktail x1 to see if this helps specifically  Elevated ALT/AST Unknown significance. No hypotension. Patient is s/p cholecystectomy. -CMP in AM  Hypothyroidism Stable. -Continue Synthroid   DVT prophylaxis: Lovenox Code Status:   Code Status: Full Code Family Communication: None at bedside Disposition Plan: Discharge pending improvement of pain and GI management   Consultants:   Gastroenterology  Procedures:   None  Antimicrobials:  None    Subjective: Pain improved today but still present  Objective: Vitals:   12/06/17 1845 12/06/17 2048 12/06/17 2103 12/07/17 0430  BP:  111/80 116/70 108/71  Pulse: (!) 59 69 75 64  Resp: 11 16 18 20   Temp:  98 F (36.7 C) 98.2 F (36.8 C) 97.9 F (36.6 C)  TempSrc:  Oral Oral Oral  SpO2: 99% 100% 100% 100%  Weight:   67.9 kg (149 lb 11.1 oz)   Height:   5\' 4"  (1.626 m)     Intake/Output Summary (Last 24 hours) at 12/07/2017 1400 Last data filed at 12/07/2017 1014 Gross per 24 hour  Intake 2275 ml  Output -  Net 2275 ml   Filed Weights   12/06/17 1418 12/06/17 2103  Weight: 65.8 kg (145 lb) 67.9 kg (149 lb 11.1 oz)    Examination:  General exam: Appears calm and comfortable Respiratory system: Clear to auscultation. Respiratory effort normal. Cardiovascular system: S1 & S2 heard, RRR. No murmurs, rubs, gallops or clicks. Gastrointestinal system: Abdomen is nondistended, soft and tender in epigastric area. No organomegaly or masses felt. Normal  bowel sounds heard. Central nervous system: Alert and oriented. No focal neurological deficits. Extremities: No edema. No calf tenderness Skin: No cyanosis. No rashes Psychiatry: Judgement and insight appear normal. Mood & affect appropriate.     Data Reviewed: I have personally reviewed following labs and imaging studies  CBC: Recent Labs  Lab 12/06/17 1417 12/07/17 0557  WBC 7.6 11.9*  NEUTROABS 5.1 10.4*  HGB 15.2* 13.1  HCT 43.9 38.1  MCV 86.8 87.4  PLT 321 063   Basic Metabolic Panel: Recent Labs  Lab 12/06/17 1417 12/07/17 0557  NA 140 140  K 3.6 4.3  CL 106 108  CO2 23 25  GLUCOSE 101* 126*  BUN 20 14  CREATININE 0.77 0.57  CALCIUM 9.7 9.2   GFR: Estimated Creatinine Clearance: 80.6 mL/min (by C-G formula based on SCr of 0.57 mg/dL). Liver Function Tests: Recent Labs  Lab 12/06/17 1417 12/07/17 0557  AST 27 71*  ALT 25 92*  ALKPHOS 89 88  BILITOT 0.9 0.7  PROT 8.3* 6.3*  ALBUMIN 4.7 3.5   Recent Labs  Lab 12/06/17 1417  LIPASE 47   No results for input(s): AMMONIA in the last 168 hours. Coagulation Profile: No results for input(s): INR, PROTIME in the last 168 hours. Cardiac Enzymes: No results for input(s): CKTOTAL, CKMB, CKMBINDEX, TROPONINI in the last 168 hours. BNP (last 3 results) No results for input(s): PROBNP in the last 8760 hours. HbA1C: No results for input(s): HGBA1C in the last 72 hours. CBG: No results for input(s): GLUCAP in  the last 168 hours. Lipid Profile: No results for input(s): CHOL, HDL, LDLCALC, TRIG, CHOLHDL, LDLDIRECT in the last 72 hours. Thyroid Function Tests: Recent Labs    12/07/17 1120  TSH 2.294   Anemia Panel: No results for input(s): VITAMINB12, FOLATE, FERRITIN, TIBC, IRON, RETICCTPCT in the last 72 hours. Sepsis Labs: No results for input(s): PROCALCITON, LATICACIDVEN in the last 168 hours.  No results found for this or any previous visit (from the past 240 hour(s)).       Radiology  Studies: No results found.      Scheduled Meds: . enoxaparin (LOVENOX) injection  40 mg Subcutaneous Q24H  . [START ON 12/10/2017] estradiol  0.05 mg Transdermal Once per day on Mon Thu  . magnesium gluconate  500 mg Oral Daily  . pantoprazole  40 mg Oral Daily  . progesterone  300 mg Oral Daily  . thyroid  30 mg Oral QAC breakfast   Continuous Infusions: . lactated ringers 100 mL/hr at 12/07/17 0431     LOS: 0 days     Cordelia Poche, MD [facility] 12/07/2017, 2:00 PM Pager: [phone]  If 7PM-7AM, please contact night-coverage [email or web address] 12/07/2017, 2:00 PM

## 2017-12-07 NOTE — Progress Notes (Signed)
Subjective: Feeling better this AM, but she still has pain.  The back pain is much better.  Objective: Vital signs in last 24 hours: Temp:  [97.9 F (36.6 C)-98.2 F (36.8 C)] 97.9 F (36.6 C) (07/12 0430) Pulse Rate:  [59-119] 64 (07/12 0430) Resp:  [11-20] 20 (07/12 0430) BP: (108-117)/(60-93) 108/71 (07/12 0430) SpO2:  [95 %-100 %] 100 % (07/12 0430) Weight:  [65.8 kg (145 lb)-67.9 kg (149 lb 11.1 oz)] 67.9 kg (149 lb 11.1 oz) (07/11 2103) Last BM Date: 12/06/17  Intake/Output from previous day: 07/11 0701 - 07/12 0700 In: 1915 [I.V.:815; IV Piggyback:1100] Out: -  Intake/Output this shift: No intake/output data recorded.  General appearance: alert, no distress and uncomfortable appearing GI: focal tenderness in the periumbilical region  Lab Results: Recent Labs    12/06/17 1417 12/07/17 0557  WBC 7.6 11.9*  HGB 15.2* 13.1  HCT 43.9 38.1  PLT 321 263   BMET Recent Labs    12/06/17 1417 12/07/17 0557  NA 140 140  K 3.6 4.3  CL 106 108  CO2 23 25  GLUCOSE 101* 126*  BUN 20 14  CREATININE 0.77 0.57  CALCIUM 9.7 9.2   LFT Recent Labs    12/07/17 0557  PROT 6.3*  ALBUMIN 3.5  AST 71*  ALT 92*  ALKPHOS 88  BILITOT 0.7  BILIDIR 0.1  IBILI 0.6   PT/INR No results for input(s): LABPROT, INR in the last 72 hours. Hepatitis Panel No results for input(s): HEPBSAG, HCVAB, HEPAIGM, HEPBIGM in the last 72 hours. C-Diff No results for input(s): CDIFFTOX in the last 72 hours. Fecal Lactopherrin No results for input(s): FECLLACTOFRN in the last 72 hours.  Studies/Results: No results found.  Medications:  Scheduled: . enoxaparin (LOVENOX) injection  40 mg Subcutaneous Q24H  . [START ON 12/10/2017] estradiol  0.05 mg Transdermal Once per day on Mon Thu  . magnesium gluconate  500 mg Oral Daily  . pantoprazole  40 mg Oral Daily  . progesterone  300 mg Oral Daily  . thyroid  30 mg Oral QAC breakfast   Continuous: . lactated ringers 100 mL/hr at  12/07/17 0431    Assessment/Plan: 1) Periumbilical pain - ? Etiology.  There are features consistent with a musculoskeletal source and she has responded to steroids and hyoscyamine.  There is the possibility of an acute enteritis, but she denies any diarrhea since yesterday.  She only had four episodes.  Plan: 1) Taper methylprednisolone. 2) Continue with hyoscyamine. 3) PO intake as tolerated.   LOS: 0 days   Smt. Loder D 12/07/2017, 7:37 AM

## 2017-12-08 ENCOUNTER — Observation Stay (HOSPITAL_COMMUNITY): Payer: 59

## 2017-12-08 DIAGNOSIS — E039 Hypothyroidism, unspecified: Secondary | ICD-10-CM | POA: Diagnosis not present

## 2017-12-08 DIAGNOSIS — R1084 Generalized abdominal pain: Secondary | ICD-10-CM

## 2017-12-08 DIAGNOSIS — R945 Abnormal results of liver function studies: Secondary | ICD-10-CM | POA: Diagnosis not present

## 2017-12-08 DIAGNOSIS — K838 Other specified diseases of biliary tract: Secondary | ICD-10-CM | POA: Diagnosis not present

## 2017-12-08 DIAGNOSIS — R7989 Other specified abnormal findings of blood chemistry: Secondary | ICD-10-CM

## 2017-12-08 DIAGNOSIS — R1013 Epigastric pain: Secondary | ICD-10-CM | POA: Diagnosis not present

## 2017-12-08 LAB — COMPREHENSIVE METABOLIC PANEL
ALT: 282 U/L — ABNORMAL HIGH (ref 0–44)
AST: 314 U/L — ABNORMAL HIGH (ref 15–41)
Albumin: 3.3 g/dL — ABNORMAL LOW (ref 3.5–5.0)
Alkaline Phosphatase: 88 U/L (ref 38–126)
Anion gap: 5 (ref 5–15)
BUN: 14 mg/dL (ref 6–20)
CO2: 29 mmol/L (ref 22–32)
Calcium: 8.8 mg/dL — ABNORMAL LOW (ref 8.9–10.3)
Chloride: 107 mmol/L (ref 98–111)
Creatinine, Ser: 0.69 mg/dL (ref 0.44–1.00)
GFR calc Af Amer: >60 mL/min (ref >60)
GFR calc non Af Amer: >60 mL/min (ref >60)
Glucose, Bld: 93 mg/dL (ref 70–99)
Potassium: 4 mmol/L (ref 3.5–5.1)
Sodium: 141 mmol/L (ref 135–145)
Total Bilirubin: 0.7 mg/dL (ref 0.3–1.2)
Total Protein: 5.9 g/dL — ABNORMAL LOW (ref 6.5–8.1)

## 2017-12-08 LAB — CBC WITH DIFFERENTIAL/PLATELET
Basophils Absolute: 0.1 10*3/uL (ref 0.0–0.1)
Basophils Relative: 1 %
Eosinophils Absolute: 0.1 10*3/uL (ref 0.0–0.7)
Eosinophils Relative: 1 %
HCT: 39.3 % (ref 36.0–46.0)
Hemoglobin: 13.4 g/dL (ref 12.0–15.0)
Lymphocytes Relative: 27 %
Lymphs Abs: 2.4 10*3/uL (ref 0.7–4.0)
MCH: 29.8 pg (ref 26.0–34.0)
MCHC: 34.1 g/dL (ref 30.0–36.0)
MCV: 87.3 fL (ref 78.0–100.0)
Monocytes Absolute: 0.9 10*3/uL (ref 0.1–1.0)
Monocytes Relative: 10 %
Neutro Abs: 5.5 10*3/uL (ref 1.7–7.7)
Neutrophils Relative %: 61 %
Platelets: 321 10*3/uL (ref 150–400)
RBC: 4.5 MIL/uL (ref 3.87–5.11)
RDW: 13.1 % (ref 11.5–15.5)
WBC: 9 10*3/uL (ref 4.0–10.5)

## 2017-12-08 LAB — URINALYSIS, ROUTINE W REFLEX MICROSCOPIC
Bilirubin Urine: NEGATIVE
Glucose, UA: NEGATIVE mg/dL
Hgb urine dipstick: NEGATIVE
Ketones, ur: NEGATIVE mg/dL
Leukocytes, UA: NEGATIVE
Nitrite: NEGATIVE
Protein, ur: NEGATIVE mg/dL
Specific Gravity, Urine: 1.012 (ref 1.005–1.030)
pH: 8 (ref 5.0–8.0)

## 2017-12-08 MED ORDER — THYROID 30 MG PO TABS
30.0000 mg | ORAL_TABLET | Freq: Every day | ORAL | Status: DC
Start: 2017-12-09 — End: 2017-12-11
  Administered 2017-12-09 – 2017-12-11 (×3): 30 mg via ORAL
  Filled 2017-12-08 (×3): qty 1

## 2017-12-08 MED ORDER — METRONIDAZOLE IN NACL 5-0.79 MG/ML-% IV SOLN
500.0000 mg | Freq: Two times a day (BID) | INTRAVENOUS | Status: DC
  Administered 2017-12-08 – 2017-12-11 (×6): 500 mg via INTRAVENOUS
  Filled 2017-12-08 (×6): qty 100

## 2017-12-08 MED ORDER — GADOBENATE DIMEGLUMINE 529 MG/ML IV SOLN
15.0000 mL | Freq: Once | INTRAVENOUS | Status: AC | PRN
  Administered 2017-12-08: 14 mL via INTRAVENOUS

## 2017-12-08 MED ORDER — HYDROMORPHONE HCL 1 MG/ML IJ SOLN
0.5000 mg | INTRAMUSCULAR | Status: DC | PRN
  Administered 2017-12-08 – 2017-12-10 (×17): 0.5 mg via INTRAVENOUS
  Filled 2017-12-08 (×17): qty 0.5

## 2017-12-08 MED ORDER — CIPROFLOXACIN IN D5W 400 MG/200ML IV SOLN
400.0000 mg | Freq: Two times a day (BID) | INTRAVENOUS | Status: DC
  Administered 2017-12-08 – 2017-12-11 (×6): 400 mg via INTRAVENOUS
  Filled 2017-12-08 (×6): qty 200

## 2017-12-08 NOTE — Progress Notes (Signed)
PROGRESS NOTE    Lydia Jenkins  YKD:[id number] DOB: 10-23-1970 DOA: 12/06/2017 PCP: Tamala Julian, MD   Brief Narrative: Lydia Jenkins is a 47 y.o. female with history of hiatal hernia, hypothyroidism. Patient presented with abdominal pain of unknown etiology. LFTs rising concerning for ductal pathology.   Assessment & Plan:   Principal Problem:   Abdominal pain Active Problems:   Hypothyroidism   Abdominal pain Possibly ductal etiology. -GI recommendations: steroids, hyoscine. Recommendations today pending. -Increase to dilaudid 0.5mg  prn -Urinalysis/culture -FOBT  Elevated ALT/AST Associated ductal dilation -GI recommendations  Hypothyroidism Stable. -Continue Synthroid   DVT prophylaxis: Lovenox Code Status:   Code Status: Full Code Family Communication: None at bedside Disposition Plan: Discharge pending improvement of pain and GI management   Consultants:   Gastroenterology  Procedures:   None  Antimicrobials:  None    Subjective: Pain worsened overnight. No nausea or vomiting. Bowel movement with ?streak of blood.  Objective: Vitals:   12/07/17 0430 12/07/17 1401 12/07/17 2133 12/08/17 0510  BP: 108/71 124/83 118/77 104/67  Pulse: 64 91 72 66  Resp: 20 18 16 16   Temp: 97.9 F (36.6 C) 98.2 F (36.8 C) 98.4 F (36.9 C) 97.8 F (36.6 C)  TempSrc: Oral Oral    SpO2: 100% 98% 100% 98%  Weight:      Height:        Intake/Output Summary (Last 24 hours) at 12/08/2017 1135 Last data filed at 12/08/2017 0830 Gross per 24 hour  Intake 2370 ml  Output -  Net 2370 ml   Filed Weights   12/06/17 1418 12/06/17 2103  Weight: 65.8 kg (145 lb) 67.9 kg (149 lb 11.1 oz)    Examination:  General exam: Appears calm and comfortable Respiratory system: Clear to auscultation. Respiratory effort normal. Cardiovascular system: S1 & S2 heard, RRR. No murmurs. Gastrointestinal system: Abdomen is nondistended, soft and diffusely tender. Normal bowel  sounds heard. Central nervous system: Alert and oriented. No focal neurological deficits. Extremities: No edema. No calf tenderness Skin: No cyanosis. No rashes Psychiatry: Judgement and insight appear normal. Mood & affect appropriate.    Data Reviewed: I have personally reviewed following labs and imaging studies  CBC: Recent Labs  Lab 12/06/17 1417 12/07/17 0557  WBC 7.6 11.9*  NEUTROABS 5.1 10.4*  HGB 15.2* 13.1  HCT 43.9 38.1  MCV 86.8 87.4  PLT 321 397   Basic Metabolic Panel: Recent Labs  Lab 12/06/17 1417 12/07/17 0557 12/08/17 0520  NA 140 140 141  K 3.6 4.3 4.0  CL 106 108 107  CO2 23 25 29   GLUCOSE 101* 126* 93  BUN 20 14 14   CREATININE 0.77 0.57 0.69  CALCIUM 9.7 9.2 8.8*   GFR: Estimated Creatinine Clearance: 80.6 mL/min (by C-G formula based on SCr of 0.69 mg/dL). Liver Function Tests: Recent Labs  Lab 12/06/17 1417 12/07/17 0557 12/08/17 0520  AST 27 71* 314*  ALT 25 92* 282*  ALKPHOS 89 88 88  BILITOT 0.9 0.7 0.7  PROT 8.3* 6.3* 5.9*  ALBUMIN 4.7 3.5 3.3*   Recent Labs  Lab 12/06/17 1417  LIPASE 47   No results for input(s): AMMONIA in the last 168 hours. Coagulation Profile: No results for input(s): INR, PROTIME in the last 168 hours. Cardiac Enzymes: No results for input(s): CKTOTAL, CKMB, CKMBINDEX, TROPONINI in the last 168 hours. BNP (last 3 results) No results for input(s): PROBNP in the last 8760 hours. HbA1C: No results for input(s): HGBA1C in the last 72 hours.  CBG: No results for input(s): GLUCAP in the last 168 hours. Lipid Profile: No results for input(s): CHOL, HDL, LDLCALC, TRIG, CHOLHDL, LDLDIRECT in the last 72 hours. Thyroid Function Tests: Recent Labs    12/07/17 1120  TSH 2.294   Anemia Panel: No results for input(s): VITAMINB12, FOLATE, FERRITIN, TIBC, IRON, RETICCTPCT in the last 72 hours. Sepsis Labs: No results for input(s): PROCALCITON, LATICACIDVEN in the last 168 hours.  No results found for this  or any previous visit (from the past 240 hour(s)).       Radiology Studies: US Abdomen Limited Ruq  Result Date: 12/08/2017 CLINICAL DATA:  Elevated LFTs EXAM: ULTRASOUND ABDOMEN LIMITED RIGHT UPPER QUADRANT COMPARISON:  CT scan December 06, 2017 FINDINGS: Gallbladder: Surgically absent.  Pain over the right upper quadrant. Common bile duct: Diameter: 14.3 mm Liver: Intrahepatic ductal dilatation. No focal mass. Portal vein is patent on color Doppler imaging with normal direction of blood flow towards the liver. IMPRESSION: 1. The common bile duct measures up to 14.3 mm. Intrahepatic ductal dilatation. By report, the common bile duct dilatation increased between the March 2017 CT scan and the December 06, 2017 CT scan. If there is concern for biliary obstruction, an MRCP could better evaluate. 2. Pain over the right upper quadrant. Electronically Signed   By: Dorise Bullion III M.D   On: 12/08/2017 11:11        Scheduled Meds: . enoxaparin (LOVENOX) injection  40 mg Subcutaneous Q24H  . [START ON 12/10/2017] estradiol  0.05 mg Transdermal Once per day on Mon Thu  . magnesium gluconate  500 mg Oral Daily  . pantoprazole  40 mg Oral Daily  . progesterone  300 mg Oral Daily  . thyroid  30 mg Oral QAC breakfast   Continuous Infusions:    LOS: 0 days     Cordelia Poche, MD [facility] 12/08/2017, 11:35 AM Pager: [phone]  If 7PM-7AM, please contact night-coverage [email or web address] 12/08/2017, 11:35 AM

## 2017-12-08 NOTE — Progress Notes (Addendum)
     [facility] Progress Note Covering for Dr. Benson Norway   Chief Complaint:   Abdominal pain / back pain    SUBJECTIVE:    diffuse abdominal pain radiating through to back is still present. Pain radiates across whole upper and mid abdomen and radiates into back. Pain constant but worse in certain position, especially laying flat. Also worse with activity. Some nausea without vomiting. Tends toward loose stools but had a large diarrheal stool this am. Saw some red in the stool but thought it was from red jello.   ASSESSMENT AND PLAN:    47 yo female with abdominal / back pain and new transaminitis and mild leukocytosis.  -transaminases mildly elevated yesterday, they have risen significantly overnight to AST 315/ ALT 282. Normal bili and alk phos. CT scan >> cholecystectomy with probably physiologic bile duct dilation measuring at 8 mm CBD.  Await u/s results. If negative, may need MRCP. Lipase 47 on admit -check am liver labs, CBC  ADDENDUM:   U/S >>> CBD ~ 14 mm, up from 8 mm.  MRCP ordered  OBJECTIVE:     Vital signs in last 24 hours: Temp:  [97.8 F (36.6 C)-98.4 F (36.9 C)] 97.8 F (36.6 C) (07/13 0510) Pulse Rate:  [66-91] 66 (07/13 0510) Resp:  [16-18] 16 (07/13 0510) BP: (104-124)/(67-83) 104/67 (07/13 0510) SpO2:  [98 %-100 %] 98 % (07/13 0510) Last BM Date: 12/06/17 General:   Alert, well-developed, female in NAD EENT:  Normal hearing, non icteric sclera, conjunctive pink.  Heart:  Regular rate and rhythm; no murmurs. No lower extremity edema Pulm: Normal respiratory effort, lungs CTA bilaterally without wheezes or crackles. Abdomen:  Soft, nondistended, mild mid right abdominal tenderness.  Normal bowel sounds, no masses felt.     Neurologic:  Alert and  oriented x4;  grossly normal neurologically. Psych:  Pleasant, cooperative.  Normal mood and affect.   Intake/Output from previous day: 07/12 0701 - 07/13 0700 In: 2850 [P.O.:1200; I.V.:1650] Out:  -  Intake/Output this shift: Total I/O In: 240 [P.O.:240] Out: -   Lab Results: Recent Labs    12/06/17 1417 12/07/17 0557  WBC 7.6 11.9*  HGB 15.2* 13.1  HCT 43.9 38.1  PLT 321 263   BMET Recent Labs    12/06/17 1417 12/07/17 0557 12/08/17 0520  NA 140 140 141  K 3.6 4.3 4.0  CL 106 108 107  CO2 _0 GLUCOSE 101* 126* 93  BUN _1 CREATININE 0.77 0.57 0.69  CALCIUM 9.7 9.2 8.8*   LFT Recent Labs    12/07/17 0557 12/08/17 0520  PROT 6.3* 5.9*  ALBUMIN 3.5 3.3*  AST 71* 314*  ALT 92* 282*  ALKPHOS 88 88  BILITOT 0.7 0.7  BILIDIR 0.1  --   IBILI 0.6  --     Principal Problem:   Abdominal pain Active Problems:   Hypothyroidism     LOS: 0 days   Tye Savoy ,NP 12/08/2017, 10:43 AM     Attending physician's note   I have taken an interval history, reviewed the chart.. I agree with the Advanced Practitioner's note, impression and recommendations.  Persistent abd pain Worsening LFT overnight Dilated CBD concerning for possible biliary sludge Patient undergoing MRCP, await results and further plan based on report.   Damaris Hippo , MD [phone]

## 2017-12-09 DIAGNOSIS — R1010 Upper abdominal pain, unspecified: Secondary | ICD-10-CM

## 2017-12-09 DIAGNOSIS — R197 Diarrhea, unspecified: Secondary | ICD-10-CM | POA: Diagnosis present

## 2017-12-09 DIAGNOSIS — Z91048 Other nonmedicinal substance allergy status: Secondary | ICD-10-CM | POA: Diagnosis not present

## 2017-12-09 DIAGNOSIS — Z888 Allergy status to other drugs, medicaments and biological substances status: Secondary | ICD-10-CM | POA: Diagnosis not present

## 2017-12-09 DIAGNOSIS — R1013 Epigastric pain: Secondary | ICD-10-CM | POA: Diagnosis not present

## 2017-12-09 DIAGNOSIS — K449 Diaphragmatic hernia without obstruction or gangrene: Secondary | ICD-10-CM | POA: Diagnosis present

## 2017-12-09 DIAGNOSIS — E039 Hypothyroidism, unspecified: Secondary | ICD-10-CM | POA: Diagnosis present

## 2017-12-09 DIAGNOSIS — K915 Postcholecystectomy syndrome: Secondary | ICD-10-CM | POA: Diagnosis present

## 2017-12-09 DIAGNOSIS — Z79899 Other long term (current) drug therapy: Secondary | ICD-10-CM | POA: Diagnosis not present

## 2017-12-09 DIAGNOSIS — K210 Gastro-esophageal reflux disease with esophagitis: Secondary | ICD-10-CM | POA: Diagnosis present

## 2017-12-09 DIAGNOSIS — R945 Abnormal results of liver function studies: Secondary | ICD-10-CM | POA: Diagnosis not present

## 2017-12-09 DIAGNOSIS — Q453 Other congenital malformations of pancreas and pancreatic duct: Secondary | ICD-10-CM | POA: Diagnosis not present

## 2017-12-09 DIAGNOSIS — K838 Other specified diseases of biliary tract: Secondary | ICD-10-CM | POA: Diagnosis not present

## 2017-12-09 DIAGNOSIS — R1084 Generalized abdominal pain: Secondary | ICD-10-CM | POA: Diagnosis not present

## 2017-12-09 DIAGNOSIS — R7989 Other specified abnormal findings of blood chemistry: Secondary | ICD-10-CM | POA: Diagnosis present

## 2017-12-09 DIAGNOSIS — M549 Dorsalgia, unspecified: Secondary | ICD-10-CM | POA: Diagnosis present

## 2017-12-09 DIAGNOSIS — Z880 Allergy status to penicillin: Secondary | ICD-10-CM | POA: Diagnosis not present

## 2017-12-09 DIAGNOSIS — K831 Obstruction of bile duct: Secondary | ICD-10-CM | POA: Diagnosis present

## 2017-12-09 DIAGNOSIS — K2970 Gastritis, unspecified, without bleeding: Secondary | ICD-10-CM | POA: Diagnosis present

## 2017-12-09 DIAGNOSIS — K5900 Constipation, unspecified: Secondary | ICD-10-CM | POA: Diagnosis not present

## 2017-12-09 LAB — COMPREHENSIVE METABOLIC PANEL
ALT: 370 U/L — ABNORMAL HIGH (ref 0–44)
AST: 245 U/L — ABNORMAL HIGH (ref 15–41)
Albumin: 3.7 g/dL (ref 3.5–5.0)
Alkaline Phosphatase: 128 U/L — ABNORMAL HIGH (ref 38–126)
Anion gap: 8 (ref 5–15)
BUN: 12 mg/dL (ref 6–20)
CO2: 25 mmol/L (ref 22–32)
Calcium: 9.1 mg/dL (ref 8.9–10.3)
Chloride: 105 mmol/L (ref 98–111)
Creatinine, Ser: 0.76 mg/dL (ref 0.44–1.00)
GFR calc Af Amer: >60 mL/min (ref >60)
GFR calc non Af Amer: >60 mL/min (ref >60)
Glucose, Bld: 100 mg/dL — ABNORMAL HIGH (ref 70–99)
Potassium: 3.6 mmol/L (ref 3.5–5.1)
Sodium: 138 mmol/L (ref 135–145)
Total Bilirubin: 0.8 mg/dL (ref 0.3–1.2)
Total Protein: 6.5 g/dL (ref 6.5–8.1)

## 2017-12-09 LAB — URINE CULTURE: Culture: <10000 — AB

## 2017-12-09 LAB — CBC
HCT: 40.3 % (ref 36.0–46.0)
Hemoglobin: 13.4 g/dL (ref 12.0–15.0)
MCH: 29.4 pg (ref 26.0–34.0)
MCHC: 33.3 g/dL (ref 30.0–36.0)
MCV: 88.4 fL (ref 78.0–100.0)
Platelets: 291 10*3/uL (ref 150–400)
RBC: 4.56 MIL/uL (ref 3.87–5.11)
RDW: 13 % (ref 11.5–15.5)
WBC: 7.3 10*3/uL (ref 4.0–10.5)

## 2017-12-09 NOTE — Progress Notes (Signed)
PROGRESS NOTE    Lydia Jenkins  HQP:[id number] DOB: 11/16/1970 DOA: 12/06/2017 PCP: Tamala Julian, MD   Brief Narrative: Lydia Jenkins is a 47 y.o. female with history of hiatal hernia, hypothyroidism. Patient presented with abdominal pain of unknown etiology. 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 Plan for ERCP.   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 -Continue Synthroid   DVT prophylaxis: Lovenox, SCDs Code Status:   Code Status: Full Code Family Communication: None at bedside Disposition Plan: Discharge pending improvement of pain and GI management   Consultants:   Gastroenterology  Procedures:   None  Antimicrobials:  Ciprofloxacin  Flagyl   Subjective: Pain unchanged.  Objective: Vitals:   12/08/17 0510 12/08/17 2049 12/09/17 0440 12/09/17 0937  BP: 104/67 119/72 123/87   Pulse: 66 73 66   Resp: 16 15 17    Temp: 97.8 F (36.6 C) 98 F (36.7 C) 98.4 F (36.9 C)   TempSrc:  Oral Oral   SpO2: 98% 100% 100%   Weight:    69.2 kg (152 lb 8.9 oz)  Height:    5\' 4"  (1.626 m)    Intake/Output Summary (Last 24 hours) at 12/09/2017 1409 Last data filed at 12/09/2017 0900 Gross per 24 hour  Intake 530 ml  Output -  Net 530 ml   Filed Weights   12/06/17 1418 12/06/17 2103 12/09/17 0937  Weight: 65.8 kg (145 lb) 67.9 kg (149 lb 11.1 oz) 69.2 kg (152 lb 8.9 oz)    Examination:  General exam: Appears calm and comfortable Respiratory system: Clear to auscultation. Respiratory effort normal. Cardiovascular system: S1 & S2 heard, RRR. No murmurs, rubs, gallops or clicks. Gastrointestinal system: Abdomen is nondistended, soft and mildly  tender. Normal bowel sounds heard. Central nervous system: Alert and oriented. No focal neurological deficits. Extremities: No edema. No calf tenderness Skin: No cyanosis. No rashes Psychiatry: Judgement and insight appear normal. Mood & affect appropriate.    Data Reviewed: I have personally reviewed following labs and imaging studies  CBC: Recent Labs  Lab 12/06/17 1417 12/07/17 0557 12/08/17 1704 12/09/17 0559  WBC 7.6 11.9* 9.0 7.3  NEUTROABS 5.1 10.4* 5.5  --   HGB 15.2* 13.1 13.4 13.4  HCT 43.9 38.1 39.3 40.3  MCV 86.8 87.4 87.3 88.4  PLT 321 263 321 599   Basic Metabolic Panel: Recent Labs  Lab 12/06/17 1417 12/07/17 0557 12/08/17 0520 12/09/17 0955  NA 140 140 141 138  K 3.6 4.3 4.0 3.6  CL 106 108 107 105  CO2 23 25 29 25   GLUCOSE 101* 126* 93 100*  BUN 20 14 14 12   CREATININE 0.77 0.57 0.69 0.76  CALCIUM 9.7 9.2 8.8* 9.1   GFR: Estimated Creatinine Clearance: 81.2 mL/min (by C-G formula based on SCr of 0.76 mg/dL). Liver Function Tests: Recent Labs  Lab 12/06/17 1417 12/07/17 0557 12/08/17 0520 12/09/17 0955  AST 27 71* 314* 245*  ALT 25 92* 282* 370*  ALKPHOS 89 88 88 128*  BILITOT 0.9 0.7 0.7 0.8  PROT 8.3* 6.3* 5.9* 6.5  ALBUMIN 4.7 3.5 3.3* 3.7   Recent Labs  Lab 12/06/17 1417  LIPASE 47   No results for input(s): AMMONIA in the last 168 hours. Coagulation  Profile: No results for input(s): INR, PROTIME in the last 168 hours. Cardiac Enzymes: No results for input(s): CKTOTAL, CKMB, CKMBINDEX, TROPONINI in the last 168 hours. BNP (last 3 results) No results for input(s): PROBNP in the last 8760 hours. HbA1C: No results for input(s): HGBA1C in the last 72 hours. CBG: No results for input(s): GLUCAP in the last 168 hours. Lipid Profile: No results for input(s): CHOL, HDL, LDLCALC, TRIG, CHOLHDL, LDLDIRECT in the last 72 hours. Thyroid Function Tests: Recent Labs    12/07/17 1120  TSH 2.294   Anemia Panel: No results for  input(s): VITAMINB12, FOLATE, FERRITIN, TIBC, IRON, RETICCTPCT in the last 72 hours. Sepsis Labs: No results for input(s): PROCALCITON, LATICACIDVEN in the last 168 hours.  No results found for this or any previous visit (from the past 240 hour(s)).       Radiology Studies: Mr 3d Recon At Scanner  Result Date: 12/08/2017 CLINICAL DATA:  Abnormal liver function tests. Dilated common bile ducts. EXAM: MRI ABDOMEN WITHOUT AND WITH CONTRAST (INCLUDING MRCP) TECHNIQUE: Multiplanar multisequence MR imaging of the abdomen was performed both before and after the administration of intravenous contrast. Heavily T2-weighted images of the biliary and pancreatic ducts were obtained, and three-dimensional MRCP images were rendered by post processing. CONTRAST:  27mL MULTIHANCE GADOBENATE DIMEGLUMINE 529 MG/ML IV SOLN COMPARISON:  CT 12/06/2017 FINDINGS: Lower chest: No acute findings. Hepatobiliary: No focal suspicious liver lesions identified. Previous cholecystectomy. There is diffuse intrahepatic bile duct dilatation and marked fusiform dilatation of the common bile duct which measures up to 1.4 cm. This has progressed from 08/10/2015 when the common bile duct measured up to 8 mm. No choledocholithiasis or mass identified. There is abrupt decreased caliber of the common bile duct approximately 6 mm proximal to the ampulla, image 89/4. 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 No pancreas mass identified. Spleen:  Within normal limits in size and appearance. Adrenals/Urinary Tract: No masses identified. No evidence of hydronephrosis. Stomach/Bowel: Visualized portions within the abdomen are unremarkable. Vascular/Lymphatic: No pathologically enlarged lymph nodes identified. No abdominal aortic aneurysm demonstrated. Other:  None. Musculoskeletal: No suspicious bone lesions identified. IMPRESSION: 1. There is intrahepatic bile duct dilatation and marked progressive  dilatation of the common bile duct. 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 This may be the sequelae of previously passed common bile duct stone. Although no underlying mass is identified this cannot be entirely excluded. Consider further evaluation with ERCP. 2. 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 Electronically Signed   By: Kerby Moors M.D.   On: 12/08/2017 14:48   Mr Abdomen Mrcp Moise Boring Contast  Result Date: 12/08/2017 CLINICAL DATA:  Abnormal liver function tests. Dilated common bile ducts. EXAM: MRI ABDOMEN WITHOUT AND WITH CONTRAST (INCLUDING MRCP) TECHNIQUE: Multiplanar multisequence MR imaging of the abdomen was performed both before and after the administration of intravenous contrast. Heavily T2-weighted images of the biliary and pancreatic ducts were obtained, and three-dimensional MRCP images were rendered by post processing. CONTRAST:  67mL MULTIHANCE GADOBENATE DIMEGLUMINE 529 MG/ML IV SOLN COMPARISON:  CT 12/06/2017 FINDINGS: Lower chest: No acute findings. Hepatobiliary: No focal suspicious liver lesions identified. Previous cholecystectomy. There is diffuse intrahepatic bile duct dilatation and marked fusiform dilatation of the common bile duct which measures up to 1.4 cm. This has progressed from 08/10/2015 when the common bile duct measured up to 8 mm. No choledocholithiasis or mass  identified. There is abrupt decreased caliber of the common bile duct approximately 6 mm proximal to the ampulla, image 89/4. 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 No pancreas mass identified. Spleen:  Within normal limits in size and appearance. Adrenals/Urinary Tract: No masses identified. No evidence of hydronephrosis. Stomach/Bowel: Visualized portions within the abdomen are unremarkable. Vascular/Lymphatic: No pathologically enlarged lymph nodes identified. No  abdominal aortic aneurysm demonstrated. Other:  None. Musculoskeletal: No suspicious bone lesions identified. IMPRESSION: 1. There is intrahepatic bile duct dilatation and marked progressive dilatation of the common bile duct. 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 This may be the sequelae of previously passed common bile duct stone. Although no underlying mass is identified this cannot be entirely excluded. Consider further evaluation with ERCP. 2. 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 Electronically Signed   By: Kerby Moors M.D.   On: 12/08/2017 14:48   US Abdomen Limited Ruq  Result Date: 12/08/2017 CLINICAL DATA:  Elevated LFTs EXAM: ULTRASOUND ABDOMEN LIMITED RIGHT UPPER QUADRANT COMPARISON:  CT scan December 06, 2017 FINDINGS: Gallbladder: Surgically absent.  Pain over the right upper quadrant. Common bile duct: Diameter: 14.3 mm Liver: Intrahepatic ductal dilatation. No focal mass. Portal vein is patent on color Doppler imaging with normal direction of blood flow towards the liver. IMPRESSION: 1. The common bile duct measures up to 14.3 mm. Intrahepatic ductal dilatation. By report, the common bile duct dilatation increased between the March 2017 CT scan and the December 06, 2017 CT scan. If there is concern for biliary obstruction, an MRCP could better evaluate. 2. Pain over the right upper quadrant. Electronically Signed   By: Dorise Bullion III M.D   On: 12/08/2017 11:11        Scheduled Meds: . Derrill Memo ON 12/10/2017] estradiol  0.05 mg Transdermal Once per day on Mon Thu  . magnesium gluconate  500 mg Oral Daily  . pantoprazole  40 mg Oral Daily  . progesterone  300 mg Oral Daily  . thyroid  30 mg Oral QAC breakfast   Continuous Infusions: . ciprofloxacin Stopped (12/09/17 0923)  . metronidazole 500 mg (12/09/17 0454)     LOS: 0 days     Cordelia Poche, MD [facility] 12/09/2017,  2:09 PM Pager: [phone]  If 7PM-7AM, please contact night-coverage [email or web address] 12/09/2017, 2:09 PM

## 2017-12-09 NOTE — H&P (View-Only) (Signed)
[facility] Progress Note Covering for Drs. Adriana Mccallum   Chief Complaint:   Abdominal pain    SUBJECTIVE:    very nauseated this am. Pain controlled with pain meds.    ASSESSMENT AND PLAN:    1. 47 yo female with acute abdominal pain / abnormal liver chemistries .  MRCP remarkable for marked dilatation of the CBD. 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 Although no underlying mass is identified this cannot be entirely excluded. Started on cipro / flagyl.  WBC normal. Afebrile -today's labs are pending -ERCP tomorrow with Dr. Benson Norway. The risks and benefits of the procedure were discussed with the patient and she agrees to proceed -will stop Lovenox today -Continue supportive care in the interim  2. Pancreas divisum     OBJECTIVE:     Vital signs in last 24 hours: Temp:  [98 F (36.7 C)-98.4 F (36.9 C)] 98.4 F (36.9 C) (07/14 0440) Pulse Rate:  [66-73] 66 (07/14 0440) Resp:  [15-17] 17 (07/14 0440) BP: (119-123)/(72-87) 123/87 (07/14 0440) SpO2:  [100 %] 100 % (07/14 0440) Last BM Date: 12/08/17 General:   Alert, well-developed female  in NAD EENT:  Normal hearing, non icteric sclera, conjunctive pink.  Heart:  Regular rate and rhythm; no murmurs. No lower extremity edema Pulm: Normal respiratory effor Abdomen:  Soft, nondistended, mild diffuse upper abdominal tenderness. A few bowel sounds. .     Neurologic:  Alert and  oriented x4;  grossly normal neurologically. Psych:  Pleasant, cooperative.  Normal mood and affect.   Intake/Output from previous day: 07/13 0701 - 07/14 0700 In: 530 [P.O.:240; IV Piggyback:290] Out: 200 [Urine:200] Intake/Output this shift: No intake/output data recorded.  Lab Results: Recent Labs    12/07/17 0557 12/08/17 1704 12/09/17 0559  WBC 11.9* 9.0 7.3  HGB 13.1 13.4 13.4  HCT 38.1 39.3 40.3  PLT 263 321 291   BMET Recent Labs    12/06/17 1417 12/07/17 0557 12/08/17 0520  NA 140 140 141   K 3.6 4.3 4.0  CL 106 108 107  CO2 23 25 29   GLUCOSE 101* 126* 93  BUN 20 14 14   CREATININE 0.77 0.57 0.69  CALCIUM 9.7 9.2 8.8*   LFT Recent Labs    12/07/17 0557 12/08/17 0520  PROT 6.3* 5.9*  ALBUMIN 3.5 3.3*  AST 71* 314*  ALT 92* 282*  ALKPHOS 88 88  BILITOT 0.7 0.7  BILIDIR 0.1  --   IBILI 0.6  --     Mr 3d Recon At Scanner  Result Date: 12/08/2017 CLINICAL DATA:  Abnormal liver function tests. Dilated common bile ducts. EXAM: MRI ABDOMEN WITHOUT AND WITH CONTRAST (INCLUDING MRCP) TECHNIQUE: Multiplanar multisequence MR imaging of the abdomen was performed both before and after the administration of intravenous contrast. Heavily T2-weighted images of the biliary and pancreatic ducts were obtained, and three-dimensional MRCP images were rendered by post processing. CONTRAST:  62mL MULTIHANCE GADOBENATE DIMEGLUMINE 529 MG/ML IV SOLN COMPARISON:  CT 12/06/2017 FINDINGS: Lower chest: No acute findings. Hepatobiliary: No focal suspicious liver lesions identified. Previous cholecystectomy. There is diffuse intrahepatic bile duct dilatation and marked fusiform dilatation of the common bile duct which measures up to 1.4 cm. This has progressed from 08/10/2015 when the common bile duct measured up to 8 mm. No choledocholithiasis or mass identified. There is abrupt decreased caliber of the common bile duct approximately 6 mm proximal to the ampulla, image 89/4. 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 No pancreas mass identified. Spleen:  Within normal limits in size and appearance. Adrenals/Urinary Tract: No masses identified. No evidence of hydronephrosis. Stomach/Bowel: Visualized portions within the abdomen are unremarkable. Vascular/Lymphatic: No pathologically enlarged lymph nodes identified. No abdominal aortic aneurysm demonstrated. Other:  None. Musculoskeletal: No suspicious bone lesions identified. IMPRESSION: 1. There is intrahepatic  bile duct dilatation and marked progressive dilatation of the common bile duct. 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 This may be the sequelae of previously passed common bile duct stone. Although no underlying mass is identified this cannot be entirely excluded. Consider further evaluation with ERCP. 2. 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 Electronically Signed   By: Kerby Moors M.D.   On: 12/08/2017 14:48   Mr Abdomen Mrcp Moise Boring Contast  Result Date: 12/08/2017 CLINICAL DATA:  Abnormal liver function tests. Dilated common bile ducts. EXAM: MRI ABDOMEN WITHOUT AND WITH CONTRAST (INCLUDING MRCP) TECHNIQUE: Multiplanar multisequence MR imaging of the abdomen was performed both before and after the administration of intravenous contrast. Heavily T2-weighted images of the biliary and pancreatic ducts were obtained, and three-dimensional MRCP images were rendered by post processing. CONTRAST:  64mL MULTIHANCE GADOBENATE DIMEGLUMINE 529 MG/ML IV SOLN COMPARISON:  CT 12/06/2017 FINDINGS: Lower chest: No acute findings. Hepatobiliary: No focal suspicious liver lesions identified. Previous cholecystectomy. There is diffuse intrahepatic bile duct dilatation and marked fusiform dilatation of the common bile duct which measures up to 1.4 cm. This has progressed from 08/10/2015 when the common bile duct measured up to 8 mm. No choledocholithiasis or mass identified. There is abrupt decreased caliber of the common bile duct approximately 6 mm proximal to the ampulla, image 89/4. 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 No pancreas mass identified. Spleen:  Within normal limits in size and appearance. Adrenals/Urinary Tract: No masses identified. No evidence of hydronephrosis. Stomach/Bowel: Visualized portions within the abdomen are unremarkable. Vascular/Lymphatic: No  pathologically enlarged lymph nodes identified. No abdominal aortic aneurysm demonstrated. Other:  None. Musculoskeletal: No suspicious bone lesions identified. IMPRESSION: 1. There is intrahepatic bile duct dilatation and marked progressive dilatation of the common bile duct. 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 This may be the sequelae of previously passed common bile duct stone. Although no underlying mass is identified this cannot be entirely excluded. Consider further evaluation with ERCP. 2. 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 Electronically Signed   By: Kerby Moors M.D.   On: 12/08/2017 14:48    Principal Problem:   Abdominal pain Active Problems:   Hypothyroidism   Elevated LFTs   Dilated cbd, acquired     LOS: 0 days   Tye Savoy ,NP 12/09/2017, 8:54 AM    Attending physician's note   I have taken an interval history, reviewed the chart and examined the patient. I agree with the Advanced Practitioner's note, impression and recommendations.   Distal CBD ?stricture with dilation of CBD to 14 mm and also has intrahepatic biliary dilation.  Plan for ERCP tomorrow with Dr. Benson Norway The risks and benefits as well as alternatives of endoscopic procedure(s) have been discussed and reviewed. All questions answered. The patient agrees to proceed. Continue antibiotics and supportive care  Damaris Hippo , MD [phone]

## 2017-12-09 NOTE — Progress Notes (Addendum)
[facility] Progress Note Covering for Drs. Adriana Mccallum   Chief Complaint:   Abdominal pain    SUBJECTIVE:    very nauseated this am. Pain controlled with pain meds.    ASSESSMENT AND PLAN:    1. 47 yo female with acute abdominal pain / abnormal liver chemistries .  MRCP remarkable for marked dilatation of the CBD. 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 Although no underlying mass is identified this cannot be entirely excluded. Started on cipro / flagyl.  WBC normal. Afebrile -today's labs are pending -ERCP tomorrow with Dr. Benson Norway. The risks and benefits of the procedure were discussed with the patient and she agrees to proceed -will stop Lovenox today -Continue supportive care in the interim  2. Pancreas divisum     OBJECTIVE:     Vital signs in last 24 hours: Temp:  [98 F (36.7 C)-98.4 F (36.9 C)] 98.4 F (36.9 C) (07/14 0440) Pulse Rate:  [66-73] 66 (07/14 0440) Resp:  [15-17] 17 (07/14 0440) BP: (119-123)/(72-87) 123/87 (07/14 0440) SpO2:  [100 %] 100 % (07/14 0440) Last BM Date: 12/08/17 General:   Alert, well-developed female  in NAD EENT:  Normal hearing, non icteric sclera, conjunctive pink.  Heart:  Regular rate and rhythm; no murmurs. No lower extremity edema Pulm: Normal respiratory effor Abdomen:  Soft, nondistended, mild diffuse upper abdominal tenderness. A few bowel sounds. .     Neurologic:  Alert and  oriented x4;  grossly normal neurologically. Psych:  Pleasant, cooperative.  Normal mood and affect.   Intake/Output from previous day: 07/13 0701 - 07/14 0700 In: 530 [P.O.:240; IV Piggyback:290] Out: 200 [Urine:200] Intake/Output this shift: No intake/output data recorded.  Lab Results: Recent Labs    12/07/17 0557 12/08/17 1704 12/09/17 0559  WBC 11.9* 9.0 7.3  HGB 13.1 13.4 13.4  HCT 38.1 39.3 40.3  PLT 263 321 291   BMET Recent Labs    12/06/17 1417 12/07/17 0557 12/08/17 0520  NA 140 140 141   K 3.6 4.3 4.0  CL 106 108 107  CO2 23 25 29   GLUCOSE 101* 126* 93  BUN 20 14 14   CREATININE 0.77 0.57 0.69  CALCIUM 9.7 9.2 8.8*   LFT Recent Labs    12/07/17 0557 12/08/17 0520  PROT 6.3* 5.9*  ALBUMIN 3.5 3.3*  AST 71* 314*  ALT 92* 282*  ALKPHOS 88 88  BILITOT 0.7 0.7  BILIDIR 0.1  --   IBILI 0.6  --     Mr 3d Recon At Scanner  Result Date: 12/08/2017 CLINICAL DATA:  Abnormal liver function tests. Dilated common bile ducts. EXAM: MRI ABDOMEN WITHOUT AND WITH CONTRAST (INCLUDING MRCP) TECHNIQUE: Multiplanar multisequence MR imaging of the abdomen was performed both before and after the administration of intravenous contrast. Heavily T2-weighted images of the biliary and pancreatic ducts were obtained, and three-dimensional MRCP images were rendered by post processing. CONTRAST:  14mL MULTIHANCE GADOBENATE DIMEGLUMINE 529 MG/ML IV SOLN COMPARISON:  CT 12/06/2017 FINDINGS: Lower chest: No acute findings. Hepatobiliary: No focal suspicious liver lesions identified. Previous cholecystectomy. There is diffuse intrahepatic bile duct dilatation and marked fusiform dilatation of the common bile duct which measures up to 1.4 cm. This has progressed from 08/10/2015 when the common bile duct measured up to 8 mm. No choledocholithiasis or mass identified. There is abrupt decreased caliber of the common bile duct approximately 6 mm proximal to the ampulla, image 89/4. 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 No pancreas mass identified. Spleen:  Within normal limits in size and appearance. Adrenals/Urinary Tract: No masses identified. No evidence of hydronephrosis. Stomach/Bowel: Visualized portions within the abdomen are unremarkable. Vascular/Lymphatic: No pathologically enlarged lymph nodes identified. No abdominal aortic aneurysm demonstrated. Other:  None. Musculoskeletal: No suspicious bone lesions identified. IMPRESSION: 1. There is intrahepatic  bile duct dilatation and marked progressive dilatation of the common bile duct. 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 This may be the sequelae of previously passed common bile duct stone. Although no underlying mass is identified this cannot be entirely excluded. Consider further evaluation with ERCP. 2. 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 Electronically Signed   By: Kerby Moors M.D.   On: 12/08/2017 14:48   Mr Abdomen Mrcp Moise Boring Contast  Result Date: 12/08/2017 CLINICAL DATA:  Abnormal liver function tests. Dilated common bile ducts. EXAM: MRI ABDOMEN WITHOUT AND WITH CONTRAST (INCLUDING MRCP) TECHNIQUE: Multiplanar multisequence MR imaging of the abdomen was performed both before and after the administration of intravenous contrast. Heavily T2-weighted images of the biliary and pancreatic ducts were obtained, and three-dimensional MRCP images were rendered by post processing. CONTRAST:  55mL MULTIHANCE GADOBENATE DIMEGLUMINE 529 MG/ML IV SOLN COMPARISON:  CT 12/06/2017 FINDINGS: Lower chest: No acute findings. Hepatobiliary: No focal suspicious liver lesions identified. Previous cholecystectomy. There is diffuse intrahepatic bile duct dilatation and marked fusiform dilatation of the common bile duct which measures up to 1.4 cm. This has progressed from 08/10/2015 when the common bile duct measured up to 8 mm. No choledocholithiasis or mass identified. There is abrupt decreased caliber of the common bile duct approximately 6 mm proximal to the ampulla, image 89/4. 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 No pancreas mass identified. Spleen:  Within normal limits in size and appearance. Adrenals/Urinary Tract: No masses identified. No evidence of hydronephrosis. Stomach/Bowel: Visualized portions within the abdomen are unremarkable. Vascular/Lymphatic: No  pathologically enlarged lymph nodes identified. No abdominal aortic aneurysm demonstrated. Other:  None. Musculoskeletal: No suspicious bone lesions identified. IMPRESSION: 1. There is intrahepatic bile duct dilatation and marked progressive dilatation of the common bile duct. 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 This may be the sequelae of previously passed common bile duct stone. Although no underlying mass is identified this cannot be entirely excluded. Consider further evaluation with ERCP. 2. 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 Electronically Signed   By: Kerby Moors M.D.   On: 12/08/2017 14:48    Principal Problem:   Abdominal pain Active Problems:   Hypothyroidism   Elevated LFTs   Dilated cbd, acquired     LOS: 0 days   Tye Savoy ,NP 12/09/2017, 8:54 AM    Attending physician's note   I have taken an interval history, reviewed the chart and examined the patient. I agree with the Advanced Practitioner's note, impression and recommendations.   Distal CBD ?stricture with dilation of CBD to 14 mm and also has intrahepatic biliary dilation.  Plan for ERCP tomorrow with Dr. Benson Norway The risks and benefits as well as alternatives of endoscopic procedure(s) have been discussed and reviewed. All questions answered. The patient agrees to proceed. Continue antibiotics and supportive care  Damaris Hippo , MD [phone]

## 2017-12-10 ENCOUNTER — Encounter (HOSPITAL_COMMUNITY)
Admission: EM | Disposition: A | Discharge status: Home / Self Care | Payer: Self-pay | Source: Home / Self Care | Attending: Family Medicine

## 2017-12-10 ENCOUNTER — Inpatient Hospital Stay (HOSPITAL_COMMUNITY): Payer: 59

## 2017-12-10 ENCOUNTER — Inpatient Hospital Stay (HOSPITAL_COMMUNITY): Payer: 59 | Admitting: Anesthesiology

## 2017-12-10 ENCOUNTER — Encounter (HOSPITAL_COMMUNITY): Payer: Self-pay | Admitting: Certified Registered Nurse Anesthetist

## 2017-12-10 DIAGNOSIS — R1915 Other abnormal bowel sounds: Secondary | ICD-10-CM

## 2017-12-10 HISTORY — PX: SPHINCTEROTOMY: SHX5279

## 2017-12-10 HISTORY — PX: ERCP: SHX5425

## 2017-12-10 HISTORY — PX: BILIARY STENT PLACEMENT: SHX5538

## 2017-12-10 LAB — COMPREHENSIVE METABOLIC PANEL
ALT: 351 U/L — ABNORMAL HIGH (ref 0–44)
AST: 170 U/L — ABNORMAL HIGH (ref 15–41)
Albumin: 3.4 g/dL — ABNORMAL LOW (ref 3.5–5.0)
Alkaline Phosphatase: 136 U/L — ABNORMAL HIGH (ref 38–126)
Anion gap: 8 (ref 5–15)
BUN: 12 mg/dL (ref 6–20)
CO2: 25 mmol/L (ref 22–32)
Calcium: 8.9 mg/dL (ref 8.9–10.3)
Chloride: 104 mmol/L (ref 98–111)
Creatinine, Ser: 0.71 mg/dL (ref 0.44–1.00)
GFR calc Af Amer: >60 mL/min (ref >60)
GFR calc non Af Amer: >60 mL/min (ref >60)
Glucose, Bld: 97 mg/dL (ref 70–99)
Potassium: 3.8 mmol/L (ref 3.5–5.1)
Sodium: 137 mmol/L (ref 135–145)
Total Bilirubin: 0.6 mg/dL (ref 0.3–1.2)
Total Protein: 6.3 g/dL — ABNORMAL LOW (ref 6.5–8.1)

## 2017-12-10 SURGERY — ERCP, WITH INTERVENTION IF INDICATED
Anesthesia: General

## 2017-12-10 MED ORDER — ONDANSETRON HCL 4 MG/2ML IJ SOLN
INTRAMUSCULAR | Status: DC | PRN
  Administered 2017-12-10: 4 mg via INTRAVENOUS

## 2017-12-10 MED ORDER — FENTANYL CITRATE (PF) 100 MCG/2ML IJ SOLN: 25.0000 ug | INTRAMUSCULAR | Status: DC | PRN

## 2017-12-10 MED ORDER — SUCCINYLCHOLINE CHLORIDE 20 MG/ML IJ SOLN
INTRAMUSCULAR | Status: DC | PRN
  Administered 2017-12-10: 120 mg via INTRAVENOUS

## 2017-12-10 MED ORDER — DEXAMETHASONE SODIUM PHOSPHATE 10 MG/ML IJ SOLN
INTRAMUSCULAR | Status: DC | PRN
  Administered 2017-12-10: 5 mg via INTRAVENOUS

## 2017-12-10 MED ORDER — INDOMETHACIN 50 MG RE SUPP: 100.0000 mg | Freq: Once | RECTAL | Status: AC

## 2017-12-10 MED ORDER — FENTANYL CITRATE (PF) 100 MCG/2ML IJ SOLN
INTRAMUSCULAR | Status: AC
  Filled 2017-12-10: qty 2

## 2017-12-10 MED ORDER — SODIUM CHLORIDE 0.9 % IV SOLN
INTRAVENOUS | Status: DC | PRN
  Administered 2017-12-10: 20 mL

## 2017-12-10 MED ORDER — INDOMETHACIN 50 MG RE SUPP
RECTAL | Status: DC | PRN
  Administered 2017-12-10: 100 mg via RECTAL

## 2017-12-10 MED ORDER — LIDOCAINE 2% (20 MG/ML) 5 ML SYRINGE
INTRAMUSCULAR | Status: DC | PRN
  Administered 2017-12-10: 100 mg via INTRAVENOUS

## 2017-12-10 MED ORDER — MIDAZOLAM HCL 2 MG/2ML IJ SOLN
INTRAMUSCULAR | Status: AC
  Filled 2017-12-10: qty 2

## 2017-12-10 MED ORDER — MIDAZOLAM HCL 5 MG/5ML IJ SOLN
INTRAMUSCULAR | Status: DC | PRN
  Administered 2017-12-10: 2 mg via INTRAVENOUS

## 2017-12-10 MED ORDER — INDOMETHACIN 50 MG RE SUPP
RECTAL | Status: AC
  Filled 2017-12-10: qty 2

## 2017-12-10 MED ORDER — PROPOFOL 10 MG/ML IV BOLUS
INTRAVENOUS | Status: DC | PRN
  Administered 2017-12-10: 150 mg via INTRAVENOUS

## 2017-12-10 MED ORDER — PROMETHAZINE HCL 25 MG/ML IJ SOLN: 6.2500 mg | INTRAMUSCULAR | Status: DC | PRN

## 2017-12-10 MED ORDER — FENTANYL CITRATE (PF) 100 MCG/2ML IJ SOLN
INTRAMUSCULAR | Status: DC | PRN
  Administered 2017-12-10: 100 ug via INTRAVENOUS
  Administered 2017-12-10: 50 ug via INTRAVENOUS
  Administered 2017-12-10: 100 ug via INTRAVENOUS

## 2017-12-10 MED ORDER — SCOPOLAMINE 1 MG/3DAYS TD PT72
MEDICATED_PATCH | TRANSDERMAL | Status: AC
  Filled 2017-12-10: qty 1

## 2017-12-10 MED ORDER — SCOPOLAMINE 1 MG/3DAYS TD PT72
1.0000 | MEDICATED_PATCH | TRANSDERMAL | Status: DC
  Administered 2017-12-10: 1.5 mg via TRANSDERMAL

## 2017-12-10 MED ORDER — GLUCAGON HCL RDNA (DIAGNOSTIC) 1 MG IJ SOLR
INTRAMUSCULAR | Status: AC
  Filled 2017-12-10: qty 1

## 2017-12-10 MED ORDER — LACTATED RINGERS IV SOLN
INTRAVENOUS | Status: DC | PRN
  Administered 2017-12-10: 12:00:00 via INTRAVENOUS

## 2017-12-10 NOTE — Transfer of Care (Signed)
Immediate Anesthesia Transfer of Care Note  Patient: Lydia Jenkins  Procedure(s) Performed: ENDOSCOPIC RETROGRADE CHOLANGIOPANCREATOGRAPHY (ERCP) (N/A )  Patient Location: PACU  Anesthesia Type:General  Level of Consciousness: awake, alert  and oriented  Airway & Oxygen Therapy: Patient Spontanous Breathing and Patient connected to face mask oxygen  Post-op Assessment: Report given to RN and Post -op Vital signs reviewed and stable  Post vital signs: Reviewed and stable  Last Vitals:  Vitals Value Taken Time  BP    Temp    Pulse    Resp    SpO2      Last Pain:  Vitals:   12/10/17 1119  TempSrc: Oral  PainSc: 3       Patients Stated Pain Goal: 0 (12/52/71 2929)  Complications: No apparent anesthesia complications

## 2017-12-10 NOTE — Op Note (Signed)
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 Estimated Blood Loss:     Estimated blood loss: none. Procedure:                Pre-Anesthesia Assessment:                           - Prior to the procedure, a History and Physical                            was performed, and patient medications and                            allergies were reviewed. The patient's tolerance of                            previous anesthesia was also reviewed. The risks                            and benefits of the procedure and the sedation                            options and risks were discussed with the patient.                            All questions were answered, and informed consent                            was obtained. Prior Anticoagulants: The patient has                            taken no previous anticoagulant or antiplatelet                            agents. ASA Grade Assessment: I - A normal, healthy                            patient. After reviewing the risks and benefits,  the patient was deemed in satisfactory condition to                            undergo the procedure.                           - Sedation was administered by an anesthesia                            professional. General anesthesia was attained.                           After obtaining informed consent, the scope was                            passed under direct vision. Throughout the                  procedure, the patient's blood pressure, pulse, and                            oxygen saturations were monitored continuously. The                            Duodenoscope was introduced through the mouth, and                            used to inject contrast into and used to inject                            contrast into the bile duct and dorsal pancreatic                            duct. The ERCP was somewhat difficult due to                            challenging cannulation. The patient tolerated the                            procedure well. Scope In: Scope Out: Findings:      The major papilla was normal. Placement of a short 0.035 inch Soft       Jagwire into the biliary tree was attempted. This passed successfully.       The main bile duct was severely dilated and diffusely dilated, acquired.       The largest diameter was 14 mm. A 5 mm biliary sphincterotomy was made       with a monofilament traction (standard) sphincterotome using ERBE       electrocautery. There was no post-sphincterotomy bleeding. The biliary       tree was swept with a 15 mm balloon starting at the bifurcation. Nothing       was found. One 8.5 Fr by 5 cm plastic stent with a single external flap       and a single internal flap was placed 4.5 cm into the common bile duct.       Bile flowed through the  stent. The stent was in good position.      The duodenoscope was advanced to the second portion of the duodenum       without any difficulty. Spontaneous and copious clear bile drainage was       noted from the ampulla. Cannulation of the ampulla was performed with       ease, however, the short passes into the PD was performed. Repositioning       of the sphincterotome did allow for the guidewire to enter into the CBD,       but it was not easily moving proximally. The sphincterotome was removed       and the scope was readjusted. Freehand cannulation with the wire without       sphincterotome  cannulation was performed. The guidewire easily advanced       into the proximal CBD and it was secured in the left intrahepatic ducts.       Over the guidewire the sphincterotome was advanced into the CBD and       contrast was injected. There was marked dilation of the CBD consistent       with the 14 mm reading from the MRCP. The contrast did abruptly stop at       the ampulla, but there was no evidence of a pathologic biliary       stricture. A small sphincterotomy was created as the ampulla was flat.       It was not clear how much further proximally the sphincterotomy could be       extended safely. The CBD was swept twice and no sludge, stones, or mucus       was removed. At the 15 mm balloon diameter the balloon was not able to       be pulled through the ampulla, but at 12 mm the balloon moved through       freely. This was confirmed twice. The stricture noted on the MRCP is       most likely the ampulla. Extension of the sphincterotomy will be       performed during the follow up ERCP versus ampullary dilation. An 8.5 Fr       x 5 cm plastic biliary stent was placed without any difficulty.       Fluoroscopic confirmation of stent placement was obtained. Impression:               - The major papilla appeared normal.                           - The entire main bile duct was severely dilated,                            acquired.                           - A biliary sphincterotomy was performed.                           - The biliary tree was swept and nothing was found.                           - One plastic stent was placed into the common bile  duct. Moderate Sedation:      N/A- Per Anesthesia Care Recommendation:           - Return patient to hospital ward for ongoing care.                           - Resume regular diet.                           - Follow liver panel. Procedure Code(s):        --- Professional ---                           [phone],  Endoscopic retrograde                            cholangiopancreatography (ERCP); with placement of                            endoscopic stent into biliary or pancreatic duct,                            including pre- and post-dilation and guide wire                            passage, when performed, including sphincterotomy,                            when performed, each stent Diagnosis Code(s):        --- Professional ---                           K83.1, Obstruction of bile duct                           K83.8, Other specified diseases of biliary tract CPT copyright 2017 [employer]. All rights reserved. The codes documented in this report are preliminary and upon coder review may  be revised to meet current compliance requirements. Carol Ada, MD Carol Ada, MD 12/10/2017 12:56:18 PM This report has been signed electronically. Number of Addenda: 0

## 2017-12-10 NOTE — Anesthesia Preprocedure Evaluation (Signed)
Anesthesia Evaluation  Patient identified by MRN, date of birth, ID band Patient awake    Reviewed: Allergy & Precautions, NPO status , Patient's Chart, lab work & pertinent test results  Airway Mallampati: II  TM Distance: >3 FB Neck ROM: Full    Dental no notable dental hx.    Pulmonary neg pulmonary ROS,    Pulmonary exam normal breath sounds clear to auscultation       Cardiovascular negative cardio ROS Normal cardiovascular exam Rhythm:Regular Rate:Normal     Neuro/Psych negative neurological ROS  negative psych ROS   GI/Hepatic Neg liver ROS, hiatal hernia,   Endo/Other  Hypothyroidism   Renal/GU negative Renal ROS  negative genitourinary   Musculoskeletal negative musculoskeletal ROS (+)   Abdominal   Peds negative pediatric ROS (+)  Hematology negative hematology ROS (+)   Anesthesia Other Findings   Reproductive/Obstetrics negative OB ROS                             Anesthesia Physical Anesthesia Plan  ASA: II  Anesthesia Plan: General   Post-op Pain Management:    Induction: Intravenous  PONV Risk Score and Plan: 3 and Ondansetron, Dexamethasone, Midazolam and Treatment may vary due to age or medical condition  Airway Management Planned: Oral ETT  Additional Equipment:   Intra-op Plan:   Post-operative Plan: Extubation in OR  Informed Consent: I have reviewed the patients History and Physical, chart, labs and discussed the procedure including the risks, benefits and alternatives for the proposed anesthesia with the patient or authorized representative who has indicated his/her understanding and acceptance.   Dental advisory given  Plan Discussed with: CRNA and Surgeon  Anesthesia Plan Comments:         Anesthesia Quick Evaluation

## 2017-12-10 NOTE — Progress Notes (Signed)
PROGRESS NOTE    Lydia Jenkins  SEG:[id number] DOB: 1970-06-22 DOA: 12/06/2017 PCP: Tamala Julian, MD   Brief Narrative: Lydia Jenkins is a 47 y.o. female with history of hiatal hernia, hypothyroidism. Patient presented with abdominal pain of unknown etiology. 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 Plan for ERCP today.   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 Decrease today -GI recommendations: ERCP today  Hypothyroidism Stable. -Continue Synthroid  Nausea Decreased bowel sounds Epigastric pain which has been persistent.  -Abdominal x-ray   DVT prophylaxis: Lovenox, SCDs Code Status:   Code Status: Full Code Family Communication: None at bedside Disposition Plan: Discharge pending improvement of pain and GI management   Consultants:   Gastroenterology  Procedures:   None  Antimicrobials:  Ciprofloxacin  Flagyl   Subjective: Pain unchanged and managed with analgesic therapy. Increased nausea overnight.  Objective: Vitals:   12/09/17 1443 12/09/17 2015 12/09/17 2100 12/10/17 0444  BP: 107/78 (!) 158/62 115/75 104/64  Pulse: 65 91  67  Resp: 14 18  18   Temp: 98.7 F (37.1 C) 99.1 F (37.3 C)  97.6 F (36.4 C)  TempSrc: Oral Oral  Oral  SpO2: 99% 95%  100%  Weight:      Height:        Intake/Output Summary (Last 24 hours) at 12/10/2017 0854 Last data filed at 12/09/2017 0900 Gross per 24 hour  Intake 240 ml  Output -  Net 240 ml   Filed Weights   12/06/17 1418 12/06/17 2103 12/09/17 0937  Weight: 65.8 kg (145 lb) 67.9 kg (149 lb 11.1 oz) 69.2 kg (152 lb 8.9 oz)    Examination:  General exam: Appears calm and comfortable Respiratory system: Clear to auscultation. Respiratory  effort normal. Cardiovascular system: S1 & S2 heard, RRR. No murmurs, rubs, gallops or clicks. Gastrointestinal system: Abdomen is nondistended, soft and epigastric tenderness. Decreased bowel sounds heard. Central nervous system: Alert and oriented. No focal neurological deficits. Extremities: No edema. No calf tenderness Skin: No cyanosis. No rashes Psychiatry: Judgement and insight appear normal. Mood & affect appropriate.    Data Reviewed: I have personally reviewed following labs and imaging studies  CBC: Recent Labs  Lab 12/06/17 1417 12/07/17 0557 12/08/17 1704 12/09/17 0559  WBC 7.6 11.9* 9.0 7.3  NEUTROABS 5.1 10.4* 5.5  --   HGB 15.2* 13.1 13.4 13.4  HCT 43.9 38.1 39.3 40.3  MCV 86.8 87.4 87.3 88.4  PLT 321 263 321 737   Basic Metabolic Panel: Recent Labs  Lab 12/06/17 1417 12/07/17 0557 12/08/17 0520 12/09/17 0955 12/10/17 0609  NA 140 140 141 138 137  K 3.6 4.3 4.0 3.6 3.8  CL 106 108 107 105 104  CO2 23 25 29 25 25   GLUCOSE 101* 126* 93 100* 97  BUN 20 14 14 12 12   CREATININE 0.77 0.57 0.69 0.76 0.71  CALCIUM 9.7 9.2 8.8* 9.1 8.9   GFR: Estimated Creatinine Clearance: 81.2 mL/min (by C-G formula based on SCr of 0.71 mg/dL). Liver Function Tests: Recent Labs  Lab 12/06/17 1417 12/07/17 0557 12/08/17 0520 12/09/17 0955 12/10/17 0609  AST 27 71* 314* 245* 170*  ALT 25 92* 282* 370* 351*  ALKPHOS 89 88 88 128* 136*  BILITOT 0.9 0.7 0.7 0.8 0.6  PROT 8.3* 6.3* 5.9* 6.5 6.3*  ALBUMIN 4.7 3.5 3.3* 3.7 3.4*   Recent Labs  Lab 12/06/17 1417  LIPASE 47   No results for input(s): AMMONIA in the last 168 hours. Coagulation Profile: No results for input(s): INR, PROTIME in the last 168 hours. Cardiac Enzymes: No results for input(s): CKTOTAL, CKMB, CKMBINDEX, TROPONINI in the last 168 hours. BNP (last 3 results) No results for input(s): PROBNP in the last 8760 hours. HbA1C: No results for input(s): HGBA1C in the last 72 hours. CBG: No results  for input(s): GLUCAP in the last 168 hours. Lipid Profile: No results for input(s): CHOL, HDL, LDLCALC, TRIG, CHOLHDL, LDLDIRECT in the last 72 hours. Thyroid Function Tests: Recent Labs    12/07/17 1120  TSH 2.294   Anemia Panel: No results for input(s): VITAMINB12, FOLATE, FERRITIN, TIBC, IRON, RETICCTPCT in the last 72 hours. Sepsis Labs: No results for input(s): PROCALCITON, LATICACIDVEN in the last 168 hours.  Recent Results (from the past 240 hour(s))  Culture, Urine     Status: Abnormal   Collection Time: 12/08/17  3:15 PM  Result Value Ref Range Status   Specimen Description   Final    URINE, RANDOM Performed at [facility] [address], [place], [place] [address]    Special Requests   Final    NONE Performed at [facility], [place] [address] [place], [place] [address]    Culture (A)  Final    <10,000 COLONIES/mL INSIGNIFICANT GROWTH Performed at [facility] [address], [place], [place] [address]    Report Status 12/09/2017 FINAL  Final         Radiology Studies: Mr 3d Recon At Scanner  Result Date: 12/08/2017 CLINICAL DATA:  Abnormal liver function tests. Dilated common bile ducts. EXAM: MRI ABDOMEN WITHOUT AND WITH CONTRAST (INCLUDING MRCP) TECHNIQUE: Multiplanar multisequence MR imaging of the abdomen was performed both before and after the administration of intravenous contrast. Heavily T2-weighted images of the biliary and pancreatic ducts were obtained, and three-dimensional MRCP images were rendered by post processing. CONTRAST:  65mL MULTIHANCE GADOBENATE DIMEGLUMINE 529 MG/ML IV SOLN COMPARISON:  CT 12/06/2017 FINDINGS: Lower chest: No acute findings. Hepatobiliary: No focal suspicious liver lesions identified. Previous cholecystectomy. There is diffuse intrahepatic bile duct dilatation and marked fusiform dilatation of the common bile duct which measures up to 1.4 cm. This has progressed from 08/10/2015  when the common bile duct measured up to 8 mm. No choledocholithiasis or mass identified. There is abrupt decreased caliber of the common bile duct approximately 6 mm proximal to the ampulla, image 89/4. 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 No pancreas mass identified. Spleen:  Within normal limits in size and appearance. Adrenals/Urinary Tract: No masses identified. No evidence of hydronephrosis. Stomach/Bowel: Visualized portions within the abdomen are unremarkable. Vascular/Lymphatic: No pathologically enlarged lymph nodes identified. No abdominal aortic aneurysm demonstrated. Other:  None. Musculoskeletal: No suspicious bone lesions identified. IMPRESSION: 1. There is intrahepatic bile duct dilatation and marked progressive dilatation of the common bile duct. 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 This may be the sequelae of previously passed common bile duct stone. Although no underlying mass is identified this cannot be entirely excluded. Consider further evaluation with ERCP. 2. 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 Electronically Signed   By: Kerby Moors M.D.   On: 12/08/2017 14:48  Mr Abdomen Mrcp Moise Boring Contast  Result Date: 12/08/2017 CLINICAL DATA:  Abnormal liver function tests. Dilated common bile ducts. EXAM: MRI ABDOMEN WITHOUT AND WITH CONTRAST (INCLUDING MRCP) TECHNIQUE: Multiplanar multisequence MR imaging of the abdomen was performed both before and after the administration of intravenous contrast. Heavily T2-weighted images of the biliary and pancreatic ducts were obtained, and three-dimensional MRCP images were rendered by post processing. CONTRAST:  52mL MULTIHANCE GADOBENATE DIMEGLUMINE 529 MG/ML IV SOLN COMPARISON:  CT 12/06/2017 FINDINGS: Lower chest: No acute findings. Hepatobiliary: No focal suspicious liver lesions identified. Previous  cholecystectomy. There is diffuse intrahepatic bile duct dilatation and marked fusiform dilatation of the common bile duct which measures up to 1.4 cm. This has progressed from 08/10/2015 when the common bile duct measured up to 8 mm. No choledocholithiasis or mass identified. There is abrupt decreased caliber of the common bile duct approximately 6 mm proximal to the ampulla, image 89/4. 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 No pancreas mass identified. Spleen:  Within normal limits in size and appearance. Adrenals/Urinary Tract: No masses identified. No evidence of hydronephrosis. Stomach/Bowel: Visualized portions within the abdomen are unremarkable. Vascular/Lymphatic: No pathologically enlarged lymph nodes identified. No abdominal aortic aneurysm demonstrated. Other:  None. Musculoskeletal: No suspicious bone lesions identified. IMPRESSION: 1. There is intrahepatic bile duct dilatation and marked progressive dilatation of the common bile duct. 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 This may be the sequelae of previously passed common bile duct stone. Although no underlying mass is identified this cannot be entirely excluded. Consider further evaluation with ERCP. 2. 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 Electronically Signed   By: Kerby Moors M.D.   On: 12/08/2017 14:48   US Abdomen Limited Ruq  Result Date: 12/08/2017 CLINICAL DATA:  Elevated LFTs EXAM: ULTRASOUND ABDOMEN LIMITED RIGHT UPPER QUADRANT COMPARISON:  CT scan December 06, 2017 FINDINGS: Gallbladder: Surgically absent.  Pain over the right upper quadrant. Common bile duct: Diameter: 14.3 mm Liver: Intrahepatic ductal dilatation. No focal mass. Portal vein is patent on color Doppler imaging with normal direction of blood flow towards the liver. IMPRESSION: 1. The common bile duct measures up to 14.3  mm. Intrahepatic ductal dilatation. By report, the common bile duct dilatation increased between the March 2017 CT scan and the December 06, 2017 CT scan. If there is concern for biliary obstruction, an MRCP could better evaluate. 2. Pain over the right upper quadrant. Electronically Signed   By: Dorise Bullion III M.D   On: 12/08/2017 11:11        Scheduled Meds: . estradiol  0.05 mg Transdermal Once per day on Mon Thu  . magnesium gluconate  500 mg Oral Daily  . pantoprazole  40 mg Oral Daily  . progesterone  300 mg Oral Daily  . thyroid  30 mg Oral QAC breakfast   Continuous Infusions: . ciprofloxacin Stopped (12/10/17 0528)  . metronidazole 500 mg (12/10/17 0602)     LOS: 1 day     Cordelia Poche, MD [facility] 12/10/2017, 8:54 AM Pager: [phone]  If 7PM-7AM, please contact night-coverage [email or web address] 12/10/2017, 8:54 AM

## 2017-12-10 NOTE — Interval H&P Note (Signed)
History and Physical Interval Note:  12/10/2017 11:52 AM  Lydia Jenkins  has presented today for surgery, with the diagnosis of CBD dilation, abnormal liver chemistries, abdominal pain  The various methods of treatment have been discussed with the patient and family. After consideration of risks, benefits and other options for treatment, the patient has consented to  Procedure(s): ENDOSCOPIC RETROGRADE CHOLANGIOPANCREATOGRAPHY (ERCP) (N/A) as a surgical intervention .  The patient's history has been reviewed, patient examined, no change in status, stable for surgery.  I have reviewed the patient's chart and labs.  Questions were answered to the patient's satisfaction.     Lydia Jenkins D

## 2017-12-10 NOTE — Anesthesia Postprocedure Evaluation (Signed)
Anesthesia Post Note  Patient: Lydia Jenkins  Procedure(s) Performed: ENDOSCOPIC RETROGRADE CHOLANGIOPANCREATOGRAPHY (ERCP) (N/A )     Patient location during evaluation: PACU Anesthesia Type: General Level of consciousness: awake and alert Pain management: pain level controlled Vital Signs Assessment: post-procedure vital signs reviewed and stable Respiratory status: spontaneous breathing, nonlabored ventilation, respiratory function stable and patient connected to nasal cannula oxygen Cardiovascular status: blood pressure returned to baseline and stable Postop Assessment: no apparent nausea or vomiting Anesthetic complications: no    Last Vitals:  Vitals:   12/10/17 1318 12/10/17 1359  BP: 139/61 (!) 125/45  Pulse: 76 67  Resp: 10 18  Temp:    SpO2: 100% 98%    Last Pain:  Vitals:   12/10/17 1339  TempSrc:   PainSc: 0-No pain                 Jeryn Cerney S

## 2017-12-10 NOTE — Anesthesia Procedure Notes (Signed)
Procedure Name: Intubation Date/Time: 12/10/2017 12:06 PM Performed by: Lollie Sails, CRNA Pre-anesthesia Checklist: Patient identified, Emergency Drugs available, Suction available, Patient being monitored and Timeout performed Patient Re-evaluated:Patient Re-evaluated prior to induction Oxygen Delivery Method: Circle system utilized Preoxygenation: Pre-oxygenation with 100% oxygen Induction Type: IV induction and Rapid sequence Laryngoscope Size: Miller and 3 Grade View: Grade I Tube type: Oral Tube size: 7.0 mm Airway Equipment and Method: Stylet Placement Confirmation: ETT inserted through vocal cords under direct vision,  positive ETCO2 and breath sounds checked- equal and bilateral Secured at: 22 cm Tube secured with: Tape Dental Injury: Teeth and Oropharynx as per pre-operative assessment

## 2017-12-11 ENCOUNTER — Encounter (HOSPITAL_COMMUNITY): Payer: Self-pay | Admitting: Gastroenterology

## 2017-12-11 LAB — COMPREHENSIVE METABOLIC PANEL
ALT: 264 U/L — ABNORMAL HIGH (ref 0–44)
AST: 84 U/L — ABNORMAL HIGH (ref 15–41)
Albumin: 3.2 g/dL — ABNORMAL LOW (ref 3.5–5.0)
Alkaline Phosphatase: 131 U/L — ABNORMAL HIGH (ref 38–126)
Anion gap: 8 (ref 5–15)
BUN: 17 mg/dL (ref 6–20)
CO2: 23 mmol/L (ref 22–32)
Calcium: 8.9 mg/dL (ref 8.9–10.3)
Chloride: 106 mmol/L (ref 98–111)
Creatinine, Ser: 0.73 mg/dL (ref 0.44–1.00)
GFR calc Af Amer: >60 mL/min (ref >60)
GFR calc non Af Amer: >60 mL/min (ref >60)
Glucose, Bld: 119 mg/dL — ABNORMAL HIGH (ref 70–99)
Potassium: 3.8 mmol/L (ref 3.5–5.1)
Sodium: 137 mmol/L (ref 135–145)
Total Bilirubin: 0.7 mg/dL (ref 0.3–1.2)
Total Protein: 6.1 g/dL — ABNORMAL LOW (ref 6.5–8.1)

## 2017-12-11 MED ORDER — POLYETHYLENE GLYCOL 3350 17 G PO PACK: 17.0000 g | PACK | Freq: Every day | ORAL | Status: DC

## 2017-12-11 MED ORDER — IBUPROFEN 200 MG PO TABS: 400.0000 mg | ORAL_TABLET | Freq: Three times a day (TID) | ORAL | Status: DC | PRN

## 2017-12-11 NOTE — Discharge Instructions (Signed)
Lydia Jenkins,  You were admitted because of abdominal pain that appears to be secondary to possible narrowing of your ductal system in your liver/pancreas area. You have a stent that was placed. Please follow-up with Dr. Benson Norway as an outpatient.

## 2017-12-11 NOTE — Progress Notes (Signed)
Subjective: Feeling much better.  Still some minor abdominal discomfort.  Uncertain if this is from constipation.  Objective: Vital signs in last 24 hours: Temp:  [98.2 F (36.8 C)-99 F (37.2 C)] 99 F (37.2 C) (07/15 2036) Pulse Rate:  [67-95] 78 (07/15 2036) Resp:  [10-19] 18 (07/15 2036) BP: (125-145)/(45-77) 126/72 (07/15 2036) SpO2:  [98 %-100 %] 99 % (07/15 2036) Last BM Date: 12/09/17  Intake/Output from previous day: 07/15 0701 - 07/16 0700 In: 1260 [P.O.:360; I.V.:900] Out: -  Intake/Output this shift: No intake/output data recorded.  General appearance: alert and no distress GI: mid abdominal tenderness, mild  Lab Results: Recent Labs    12/08/17 1704 12/09/17 0559  WBC 9.0 7.3  HGB 13.4 13.4  HCT 39.3 40.3  PLT 321 291   BMET Recent Labs    12/09/17 0955 12/10/17 0609  NA 138 137  K 3.6 3.8  CL 105 104  CO2 25 25  GLUCOSE 100* 97  BUN 12 12  CREATININE 0.76 0.71  CALCIUM 9.1 8.9   LFT Recent Labs    12/10/17 0609  PROT 6.3*  ALBUMIN 3.4*  AST 170*  ALT 351*  ALKPHOS 136*  BILITOT 0.6   PT/INR No results for input(s): LABPROT, INR in the last 72 hours. Hepatitis Panel No results for input(s): HEPBSAG, HCVAB, HEPAIGM, HEPBIGM in the last 72 hours. C-Diff No results for input(s): CDIFFTOX in the last 72 hours. Fecal Lactopherrin No results for input(s): FECLLACTOFRN in the last 72 hours.  Studies/Results: Dg Ercp Biliary & Pancreatic Ducts  Result Date: 12/10/2017 CLINICAL DATA:  47 year old female with biliary stricture undergoing ERCP with stent placement EXAM: ERCP TECHNIQUE: Multiple spot images obtained with the fluoroscopic device and submitted for interpretation post-procedure. FLUOROSCOPY TIME:  Fluoroscopy Time:  1 minutes 30 seconds Radiation Exposure Index (if provided by the fluoroscopic device): 33.8 mGy Number of Acquired Spot Images: 0 COMPARISON:  MRCP 12/08/2017 FINDINGS: A total of 6 intraoperative saved images were  obtained and submitted for review. The images demonstrate a flexible endoscope in the descending duodenum with deep wire cannulation of the hepatic ducts. Subsequent cholangiogram demonstrates biliary ductal dilatation. The subsequent images demonstrate sphincterotomy, balloon sweep of the common duct and placement of a plastic biliary stent. IMPRESSION: ERCP with sphincterotomy, balloon sweep of the common duct and placement of a plastic biliary stent. These images were submitted for radiologic interpretation only. Please see the procedural report for the amount of contrast and the fluoroscopy time utilized. Electronically Signed   By: Jacqulynn Cadet M.D.   On: 12/10/2017 13:29    Medications:  Scheduled: . estradiol  0.05 mg Transdermal Once per day on Mon Thu  . magnesium gluconate  500 mg Oral Daily  . pantoprazole  40 mg Oral Daily  . progesterone  300 mg Oral Daily  . thyroid  30 mg Oral QAC breakfast   Continuous: . ciprofloxacin 400 mg (12/10/17 2002)  . 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 2) Constipation.   There is residual bloating in her abdomen and she is constipated.  This is most likely from the narcotic medications.  The patient feels the need to have a bowel movement.  She can feel some slight discomfort with the original pain, but it is tolerable and not requiring any pain medications.  Liver panel is pending this AM.  Overall it  should be markedly improved or normalized with the stent.  Plan: 1) Okay to D/C home. 2) She will follow up in the office. 3) She can use a laxative here or at home.  LOS: 2 days   Hearl Heikes D 12/11/2017, 7:04 AM

## 2017-12-11 NOTE — Discharge Summary (Signed)
Physician Discharge Summary  Kasyn Stouffer TDV:[id number] DOB: [birth year] DOA: 12/06/2017  PCP: Tamala Julian, MD  Admit date: 12/06/2017 Discharge date: 12/11/2017  Admitted From: Home Disposition: Home  Recommendations for Outpatient Follow-up:  1. Follow up with PCP in 1 week 2. Follow up with Gastroenterology in 1 week 3. Please obtain CMP/CBC in one week 4. Please follow up on the following pending results: None  Home Health: None Equipment/Devices: None  Discharge Condition: Stable CODE STATUS: Full code Diet recommendation: Soft diet   Brief/Interim Summary:  Admission HPI written by Rise Patience, MD   Chief Complaint: Abdominal pain.  HPI: Lydia Jenkins is a 47 y.o. female with history of hiatal hernia, hypothyroidism presents to the ER with complaints of severe periumbilical pain radiating to the back.  Patient's pain started acutely and presented to her urologist with concerns of ureteric stone.  As per the report patient had CT renal stone study done which was negative for anything acute and did show biliary dilation from postcholecystectomy.  Patient then was referred to the ER.  Had some nausea denies any vomiting or diarrhea.  Pain is mostly periumbilical radiating to back.  Denies any fever chills.  ED Course: In the ER patient's gastroenterologist Dr. Benson Norway was consulted.  Dr. Benson Norway advised to give a dose of steroid and hyoscyamine.  Despite which patient was still having severe pain and admitted for further observation.   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 GI performed ERCP with stent placement. She was empirically treated with Ciprofloxacin and Flagyl which were discontinued prior to discharge. Outpatient follow-up with GI.  Elevated ALT/AST Associated ductal dilation. Decreasing. Outpatient follow-up.  Hypothyroidism Stable. Continued Synthroid  Nausea Decreased bowel sounds Secondary to narcotics.  Miralax.   Discharge Diagnoses:  Principal Problem:   Abdominal pain Active Problems:   Hypothyroidism   Elevated LFTs   Dilated cbd, acquired    Discharge Instructions  Discharge Instructions    Call MD for:  persistant nausea and vomiting   Complete by:  As directed    Call MD for:  severe uncontrolled pain   Complete by:  As directed    Call MD for:  temperature >100.4   Complete by:  As directed    Increase activity slowly   Complete by:  As directed      Allergies as of 12/11/2017      Reactions   Adhesive [tape] Other (See Comments)   Blisters   Lortab [hydrocodone-acetaminophen] Itching   Penicillins Hives   Has patient had a PCN reaction causing immediate rash, facial/tongue/throat swelling, SOB or lightheadedness with hypotension: No Has patient had a PCN reaction causing severe rash involving mucus membranes or skin necrosis: No Has patient had a PCN reaction that required hospitalization: No Has patient had a PCN reaction occurring within the last 10 years: No If all of the above answers are "NO", then may proceed with Cephalosporin use.   Percocet [oxycodone-acetaminophen] Itching      Medication List    TAKE these medications   BERBERINE COMPLEX PO Take 1 tablet by mouth daily.   DEXILANT 60 MG capsule Generic drug:  dexlansoprazole Take 60 mg by mouth daily.   estradiol 0.05 MG/24HR patch Commonly known as:  VIVELLE-DOT Place 1 patch onto the skin 2 (two) times a week.   FIRST-TESTOSTERONE MC 2 % Crea Place onto the skin.   ibuprofen 200 MG tablet Commonly known as:  ADVIL,MOTRIN Take 2 tablets (400  mg total) by mouth every 8 (eight) hours as needed.   Magnesium 400 MG Tabs Take 400 mg by mouth daily.   NATURE-THROID 32.5 MG tablet Generic drug:  thyroid Take 32.5 mg by mouth daily.   polyethylene glycol packet Commonly known as:  MIRALAX / GLYCOLAX Take 17 g by mouth daily.   PROBIOTIC DAILY PO Take 1 tablet by mouth daily.    progesterone 100 MG capsule Commonly known as:  PROMETRIUM Take 300 mg by mouth daily.   TURMERIC PO Take 1 tablet by mouth daily.      Follow-up Information    Carol Ada, MD. Schedule an appointment as soon as possible for a visit in 1 week(s).   Specialty:  Gastroenterology Contact information: [place], [address] [place] [address] [phone]        Tamala Julian, MD. Schedule an appointment as soon as possible for a visit in 1 week(s).   Specialty:  Family Medicine Contact information: [address] [place] [address] [phone]          Allergies  Allergen Reactions  . Adhesive [Tape] Other (See Comments)    Blisters  . Lortab [Hydrocodone-Acetaminophen] Itching  . Penicillins Hives    Has patient had a PCN reaction causing immediate rash, facial/tongue/throat swelling, SOB or lightheadedness with hypotension: No Has patient had a PCN reaction causing severe rash involving mucus membranes or skin necrosis: No Has patient had a PCN reaction that required hospitalization: No Has patient had a PCN reaction occurring within the last 10 years: No If all of the above answers are "NO", then may proceed with Cephalosporin use.   Marland Kitchen Percocet [Oxycodone-Acetaminophen] Itching    Consultations:  Gastroenterology   Procedures/Studies: Mr 3d Recon At Scanner  Result Date: 12/08/2017 CLINICAL DATA:  Abnormal liver function tests. Dilated common bile ducts. EXAM: MRI ABDOMEN WITHOUT AND WITH CONTRAST (INCLUDING MRCP) TECHNIQUE: Multiplanar multisequence MR imaging of the abdomen was performed both before and after the administration of intravenous contrast. Heavily T2-weighted images of the biliary and pancreatic ducts were obtained, and three-dimensional MRCP images were rendered by post processing. CONTRAST:  81mL MULTIHANCE GADOBENATE DIMEGLUMINE 529 MG/ML IV SOLN COMPARISON:  CT 12/06/2017 FINDINGS: Lower chest: No acute  findings. Hepatobiliary: No focal suspicious liver lesions identified. Previous cholecystectomy. There is diffuse intrahepatic bile duct dilatation and marked fusiform dilatation of the common bile duct which measures up to 1.4 cm. This has progressed from 08/10/2015 when the common bile duct measured up to 8 mm. No choledocholithiasis or mass identified. There is abrupt decreased caliber of the common bile duct approximately 6 mm proximal to the ampulla, image 89/4. 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 No pancreas mass identified. Spleen:  Within normal limits in size and appearance. Adrenals/Urinary Tract: No masses identified. No evidence of hydronephrosis. Stomach/Bowel: Visualized portions within the abdomen are unremarkable. Vascular/Lymphatic: No pathologically enlarged lymph nodes identified. No abdominal aortic aneurysm demonstrated. Other:  None. Musculoskeletal: No suspicious bone lesions identified. IMPRESSION: 1. There is intrahepatic bile duct dilatation and marked progressive dilatation of the common bile duct. 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 This may be the sequelae of previously passed common bile duct stone. Although no underlying mass is identified this cannot be entirely excluded. Consider further evaluation with ERCP. 2. 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 Electronically Signed   By:  Kerby Moors M.D.   On: 12/08/2017 14:48   Dg Ercp Biliary & Pancreatic Ducts  Result Date: 12/10/2017 CLINICAL DATA:  47 year old female with biliary stricture undergoing ERCP with stent placement EXAM: ERCP TECHNIQUE: Multiple spot images obtained with the fluoroscopic device and submitted for interpretation post-procedure. FLUOROSCOPY TIME:  Fluoroscopy Time:  1 minutes 30 seconds Radiation Exposure Index (if provided by the fluoroscopic device): 33.8  mGy Number of Acquired Spot Images: 0 COMPARISON:  MRCP 12/08/2017 FINDINGS: A total of 6 intraoperative saved images were obtained and submitted for review. The images demonstrate a flexible endoscope in the descending duodenum with deep wire cannulation of the hepatic ducts. Subsequent cholangiogram demonstrates biliary ductal dilatation. The subsequent images demonstrate sphincterotomy, balloon sweep of the common duct and placement of a plastic biliary stent. IMPRESSION: ERCP with sphincterotomy, balloon sweep of the common duct and placement of a plastic biliary stent. These images were submitted for radiologic interpretation only. Please see the procedural report for the amount of contrast and the fluoroscopy time utilized. Electronically Signed   By: Jacqulynn Cadet M.D.   On: 12/10/2017 13:29   Mr Abdomen Mrcp Moise Boring Contast  Result Date: 12/08/2017 CLINICAL DATA:  Abnormal liver function tests. Dilated common bile ducts. EXAM: MRI ABDOMEN WITHOUT AND WITH CONTRAST (INCLUDING MRCP) TECHNIQUE: Multiplanar multisequence MR imaging of the abdomen was performed both before and after the administration of intravenous contrast. Heavily T2-weighted images of the biliary and pancreatic ducts were obtained, and three-dimensional MRCP images were rendered by post processing. CONTRAST:  48mL MULTIHANCE GADOBENATE DIMEGLUMINE 529 MG/ML IV SOLN COMPARISON:  CT 12/06/2017 FINDINGS: Lower chest: No acute findings. Hepatobiliary: No focal suspicious liver lesions identified. Previous cholecystectomy. There is diffuse intrahepatic bile duct dilatation and marked fusiform dilatation of the common bile duct which measures up to 1.4 cm. This has progressed from 08/10/2015 when the common bile duct measured up to 8 mm. No choledocholithiasis or mass identified. There is abrupt decreased caliber of the common bile duct approximately 6 mm proximal to the ampulla, image 89/4. 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 No pancreas mass identified. Spleen:  Within normal limits in size and appearance. Adrenals/Urinary Tract: No masses identified. No evidence of hydronephrosis. Stomach/Bowel: Visualized portions within the abdomen are unremarkable. Vascular/Lymphatic: No pathologically enlarged lymph nodes identified. No abdominal aortic aneurysm demonstrated. Other:  None. Musculoskeletal: No suspicious bone lesions identified. IMPRESSION: 1. There is intrahepatic bile duct dilatation and marked progressive dilatation of the common bile duct. 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 This may be the sequelae of previously passed common bile duct stone. Although no underlying mass is identified this cannot be entirely excluded. Consider further evaluation with ERCP. 2. 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 Electronically Signed   By: Kerby Moors M.D.   On: 12/08/2017 14:48   US Abdomen Limited Ruq  Result Date: 12/08/2017 CLINICAL DATA:  Elevated LFTs EXAM: ULTRASOUND ABDOMEN LIMITED RIGHT UPPER QUADRANT COMPARISON:  CT scan December 06, 2017 FINDINGS: Gallbladder: Surgically absent.  Pain over the right upper quadrant. Common bile duct: Diameter: 14.3 mm Liver: Intrahepatic ductal dilatation. No focal mass. Portal vein is patent on color Doppler imaging with normal direction of blood flow towards the liver. IMPRESSION: 1. The common bile duct measures up to 14.3 mm. Intrahepatic ductal dilatation. By report, the common bile duct dilatation increased between the March  2017 CT scan and the December 06, 2017 CT scan. If there is concern for biliary obstruction, an MRCP could better evaluate. 2. Pain over the right upper quadrant. Electronically Signed   By: Dorise Bullion III M.D   On: 12/08/2017 11:11      7/15: ERCP Impression:               - The major papilla appeared normal.                            - The entire main bile duct was severely dilated,                            acquired.                           - A biliary sphincterotomy was performed.                           - The biliary tree was swept and nothing was found.                           - One plastic stent was placed into the common bile                            duct.  Recommendation:           - Return patient to hospital ward for ongoing care.                           - Resume regular diet.                           - Follow liver panel.    Subjective: Pain significantly improved.  Discharge Exam: Vitals:   12/10/17 1842 12/10/17 2036  BP: 128/77 126/72  Pulse: 79 78  Resp: 18 18  Temp: 98.2 F (36.8 C) 99 F (37.2 C)  SpO2: 99% 99%   Vitals:   12/10/17 1318 12/10/17 1359 12/10/17 1842 12/10/17 2036  BP: 139/61 (!) 125/45 128/77 126/72  Pulse: 76 67 79 78  Resp: 10 18 18 18   Temp:  98.2 F (36.8 C) 98.2 F (36.8 C) 99 F (37.2 C)  TempSrc:  Oral Oral Oral  SpO2: 100% 98% 99% 99%  Weight:      Height:        General: Pt is alert, awake, not in acute distress Cardiovascular: RRR, S1/S2 +, no rubs, no gallops Respiratory: CTA bilaterally, no wheezing, no rhonchi Abdominal: Soft, mildly tender in epigastrium, ND, bowel sounds + Extremities: no edema, no cyanosis    The results of significant diagnostics from this hospitalization (including imaging, microbiology, ancillary and laboratory) are listed below for reference.     Microbiology: Recent Results (from the past 240 hour(s))  Culture, Urine     Status: Abnormal   Collection Time: 12/08/17  3:15 PM  Result Value Ref Range Status   Specimen Description   Final    URINE, RANDOM Performed at [facility] [address] [place], [place] [address]    Special Requests   Final    NONE Performed  at [facility], [place] [address] [place], [place] [address]    Culture (A)  Final    <10,000  COLONIES/mL INSIGNIFICANT GROWTH Performed at [facility] [address], [place], [place] [address]    Report Status 12/09/2017 FINAL  Final     Labs: BNP (last 3 results) No results for input(s): BNP in the last 8760 hours. Basic Metabolic Panel: Recent Labs  Lab 12/07/17 0557 12/08/17 0520 12/09/17 0955 12/10/17 0609 12/11/17 0644  NA 140 141 138 137 137  K 4.3 4.0 3.6 3.8 3.8  CL 108 107 105 104 106  CO2 25 29 25 25 23   GLUCOSE 126* 93 100* 97 119*  BUN 14 14 12 12 17   CREATININE 0.57 0.69 0.76 0.71 0.73  CALCIUM 9.2 8.8* 9.1 8.9 8.9   Liver Function Tests: Recent Labs  Lab 12/07/17 0557 12/08/17 0520 12/09/17 0955 12/10/17 0609 12/11/17 0644  AST 71* 314* 245* 170* 84*  ALT 92* 282* 370* 351* 264*  ALKPHOS 88 88 128* 136* 131*  BILITOT 0.7 0.7 0.8 0.6 0.7  PROT 6.3* 5.9* 6.5 6.3* 6.1*  ALBUMIN 3.5 3.3* 3.7 3.4* 3.2*   Recent Labs  Lab 12/06/17 1417  LIPASE 47   No results for input(s): AMMONIA in the last 168 hours. CBC: Recent Labs  Lab 12/06/17 1417 12/07/17 0557 12/08/17 1704 12/09/17 0559  WBC 7.6 11.9* 9.0 7.3  NEUTROABS 5.1 10.4* 5.5  --   HGB 15.2* 13.1 13.4 13.4  HCT 43.9 38.1 39.3 40.3  MCV 86.8 87.4 87.3 88.4  PLT 321 263 321 291   Cardiac Enzymes: No results for input(s): CKTOTAL, CKMB, CKMBINDEX, TROPONINI in the last 168 hours. BNP: Invalid input(s): POCBNP CBG: No results for input(s): GLUCAP in the last 168 hours. D-Dimer No results for input(s): DDIMER in the last 72 hours. Hgb A1c No results for input(s): HGBA1C in the last 72 hours. Lipid Profile No results for input(s): CHOL, HDL, LDLCALC, TRIG, CHOLHDL, LDLDIRECT in the last 72 hours. Thyroid function studies No results for input(s): TSH, T4TOTAL, T3FREE, THYROIDAB in the last 72 hours.  Invalid input(s): FREET3 Anemia work up No results for input(s): VITAMINB12, FOLATE, FERRITIN, TIBC, IRON, RETICCTPCT in the last 72 hours. Urinalysis    Component Value  Date/Time   COLORURINE YELLOW 12/08/2017 1515   APPEARANCEUR CLEAR 12/08/2017 1515   LABSPEC 1.012 12/08/2017 1515   PHURINE 8.0 12/08/2017 1515   GLUCOSEU NEGATIVE 12/08/2017 1515   HGBUR NEGATIVE 12/08/2017 1515   BILIRUBINUR NEGATIVE 12/08/2017 1515   BILIRUBINUR - 03/12/2013 1558   KETONESUR NEGATIVE 12/08/2017 1515   PROTEINUR NEGATIVE 12/08/2017 1515   UROBILINOGEN negative 03/12/2013 1558   NITRITE NEGATIVE 12/08/2017 1515   LEUKOCYTESUR NEGATIVE 12/08/2017 1515   Sepsis Labs Invalid input(s): PROCALCITONIN,  WBC,  LACTICIDVEN Microbiology Recent Results (from the past 240 hour(s))  Culture, Urine     Status: Abnormal   Collection Time: 12/08/17  3:15 PM  Result Value Ref Range Status   Specimen Description   Final    URINE, RANDOM Performed at [facility], [place] [address] [place], [place] [address]    Special Requests   Final    NONE Performed at [facility], [place] [address], [place], [place] [address]    Culture (A)  Final    <10,000 COLONIES/mL INSIGNIFICANT GROWTH Performed at [facility] [address], [place],  [address]    Report Status 12/09/2017 FINAL  Final  SIGNED:   Cordelia Poche, MD [facility] 12/11/2017, 12:19 PM

## 2018-01-09 ENCOUNTER — Ambulatory Visit
Admission: RE | Admit: 2018-01-09 | Discharge: 2018-01-09 | Disposition: A | Discharge status: Home / Self Care | Payer: 59 | Source: Ambulatory Visit | Attending: Gynecology | Admitting: Gynecology

## 2018-01-09 DIAGNOSIS — Z1231 Encounter for screening mammogram for malignant neoplasm of breast: Secondary | ICD-10-CM

## 2018-07-04 ENCOUNTER — Encounter: Payer: 59 | Admitting: Gynecology

## 2018-11-06 IMAGING — MG DIGITAL SCREENING BILATERAL MAMMOGRAM WITH IMPLANTS, CAD AND TOM
8 of 12 series · 8 of 28 positions shown · non-contrast
Comparison: Previous exam(s).

CLINICAL DATA: Screening.

EXAM:
DIGITAL SCREENING BILATERAL MAMMOGRAM WITH IMPLANTS, CAD AND TOMO
The patient has retropectoral implants. Standard and implant
displaced views were performed.

[L MLO]
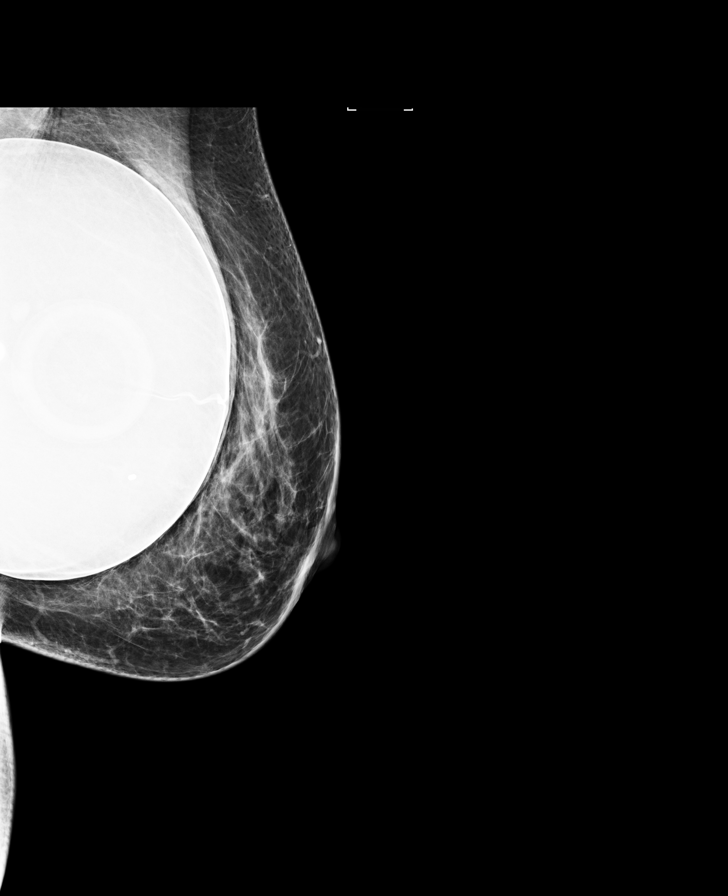

[R MLO]
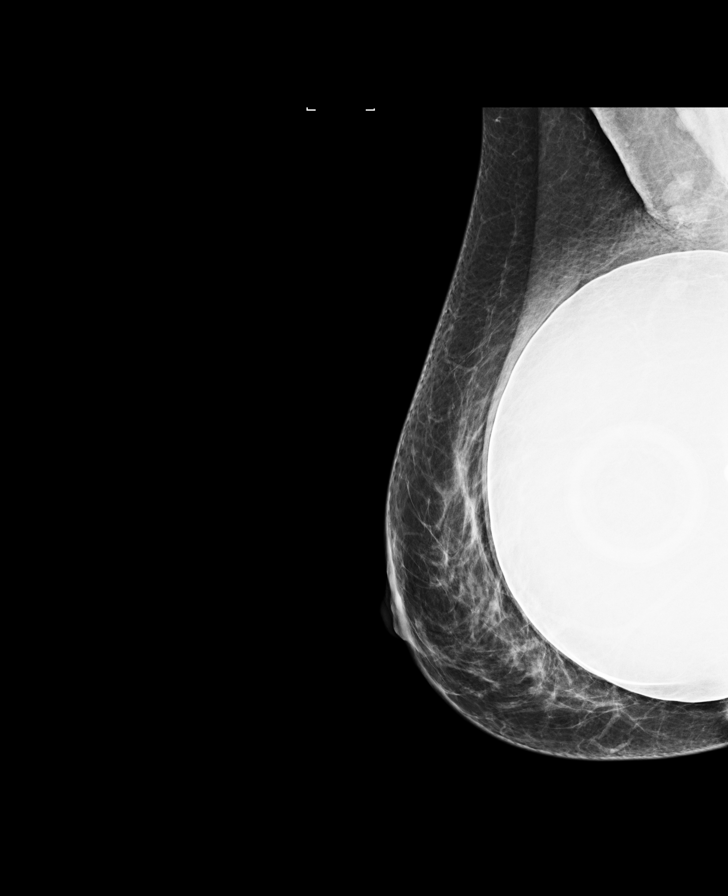

[L CC]
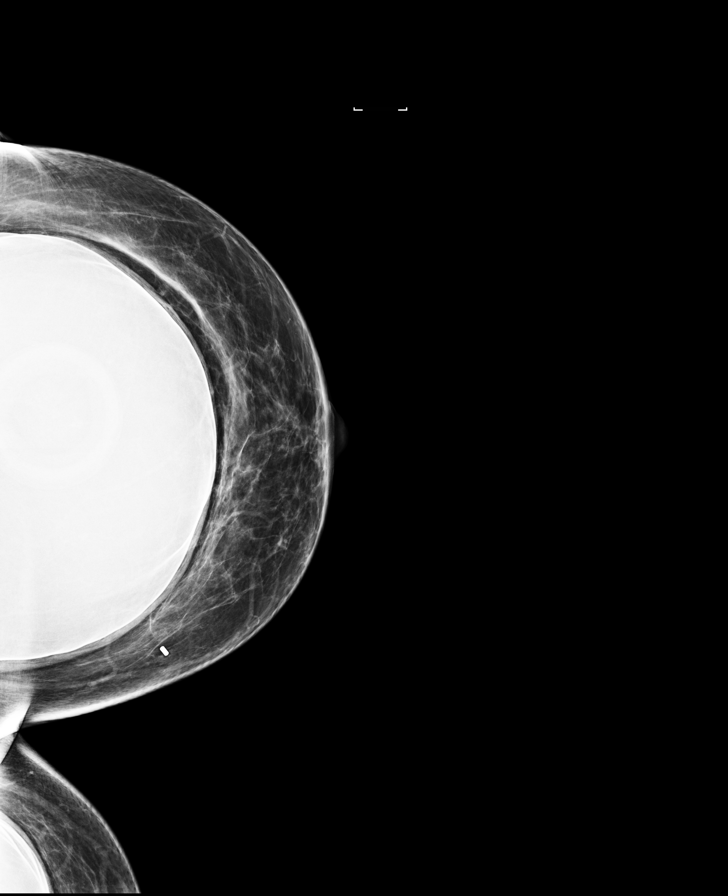

[R CC]
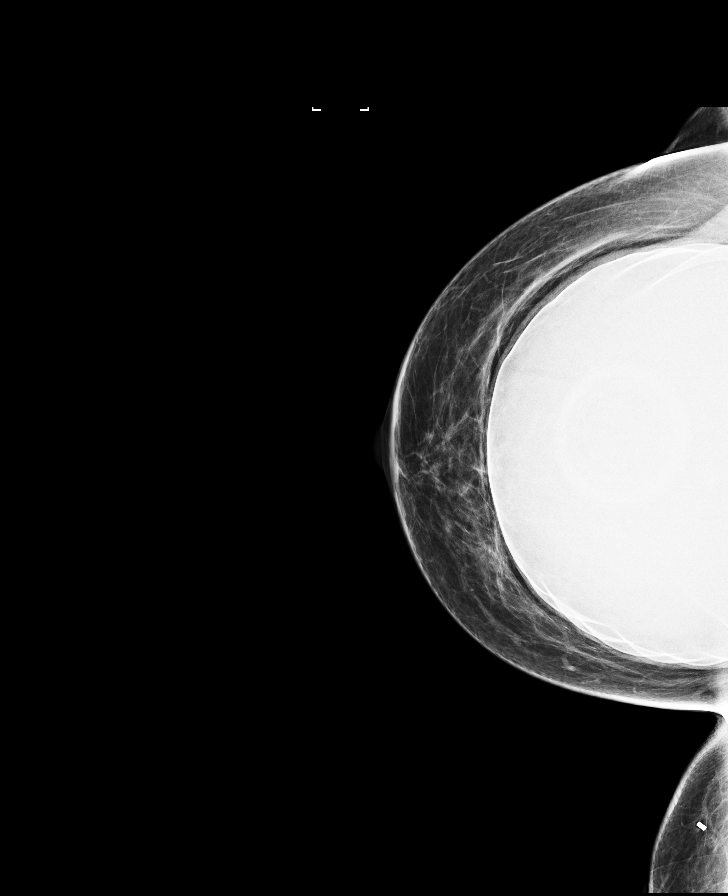

[R MLO synth-2D]
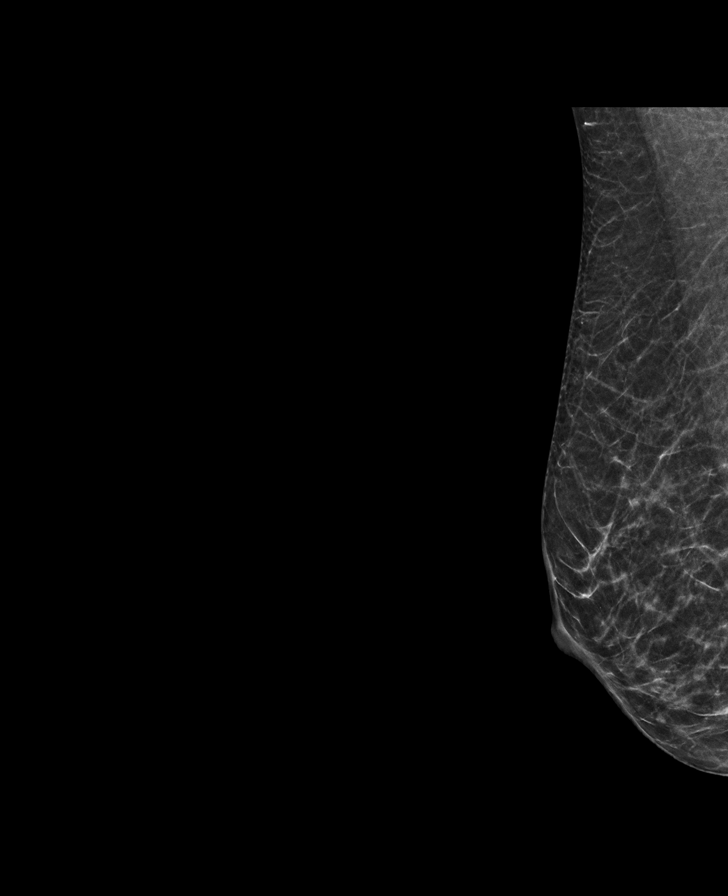

[R CC synth-2D]
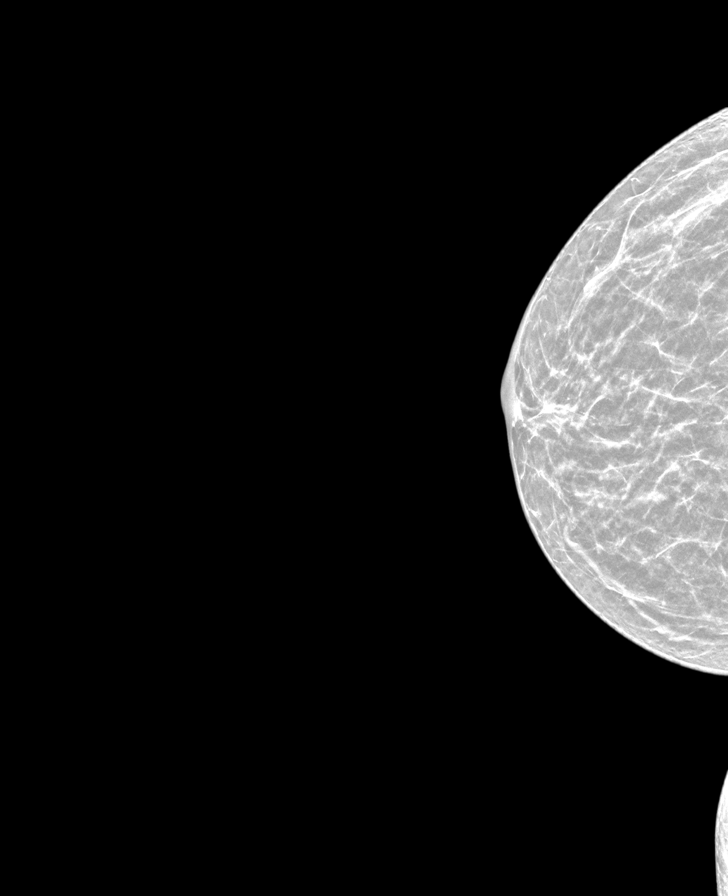

[L CC synth-2D]
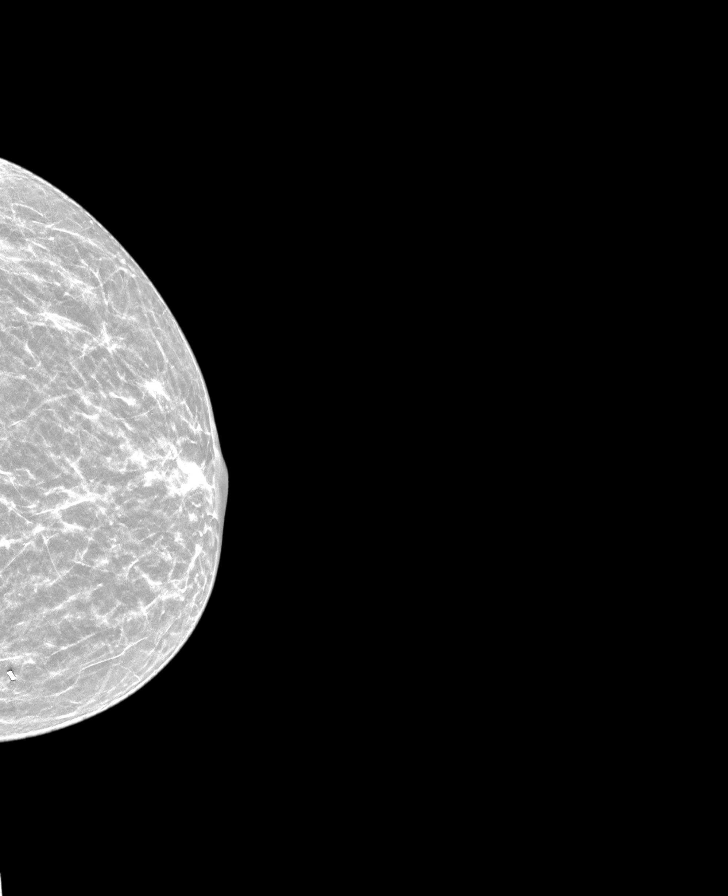

[L MLO synth-2D]
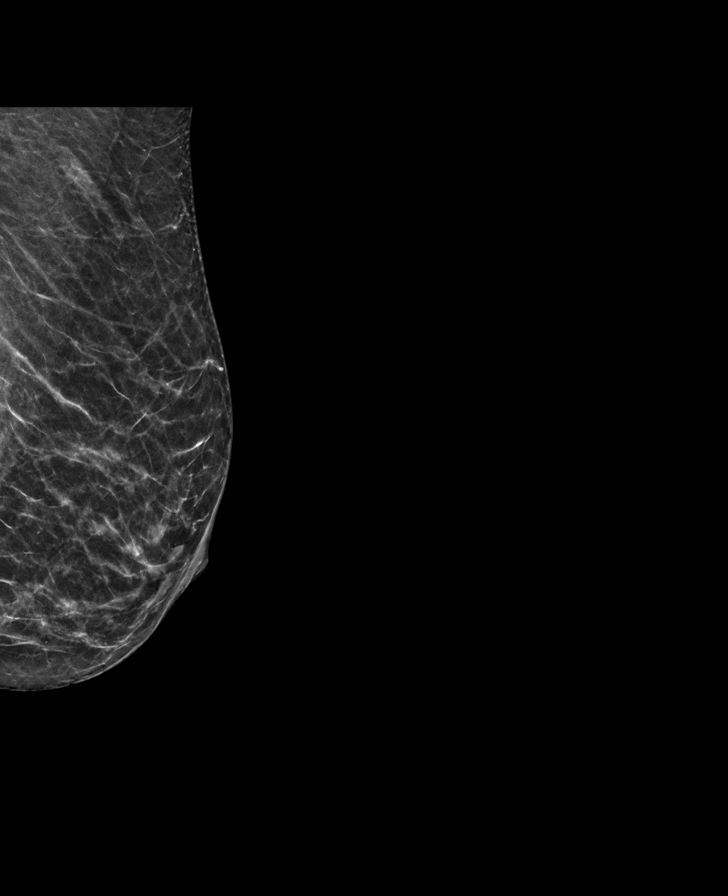

[8 of 28 positions shown; findings below may reference images not displayed]

ACR Breast Density Category b: There are scattered areas of
fibroglandular density.
FINDINGS: There are no findings suspicious for malignancy. Images were
processed with CAD.
IMPRESSION: No mammographic evidence of malignancy. A result letter of this
screening mammogram will be mailed directly to the patient.

RECOMMENDATION:
Screening mammogram in one year. (Code:60-T-8Z4)

BI-RADS CATEGORY  1:  Negative.

## 2019-02-19 ENCOUNTER — Encounter: Payer: Self-pay | Admitting: Gynecology

## 2019-03-17 ENCOUNTER — Other Ambulatory Visit: Payer: Self-pay | Admitting: Gynecology

## 2019-03-17 DIAGNOSIS — Z1231 Encounter for screening mammogram for malignant neoplasm of breast: Secondary | ICD-10-CM

## 2019-04-30 ENCOUNTER — Ambulatory Visit (INDEPENDENT_AMBULATORY_CARE_PROVIDER_SITE_OTHER): Payer: 59 | Admitting: Gynecology

## 2019-04-30 ENCOUNTER — Other Ambulatory Visit: Payer: Self-pay

## 2019-04-30 ENCOUNTER — Encounter: Payer: Self-pay | Admitting: Gynecology

## 2019-04-30 VITALS — BP 120/74 | Ht 65.0 in | Wt 151.0 lb

## 2019-04-30 DIAGNOSIS — Z1151 Encounter for screening for human papillomavirus (HPV): Secondary | ICD-10-CM | POA: Diagnosis not present

## 2019-04-30 DIAGNOSIS — Z79890 Hormone replacement therapy (postmenopausal): Secondary | ICD-10-CM

## 2019-04-30 DIAGNOSIS — Z01419 Encounter for gynecological examination (general) (routine) without abnormal findings: Secondary | ICD-10-CM

## 2019-04-30 NOTE — Patient Instructions (Signed)
Follow-up in 1 year for annual exam, sooner if any issues. 

## 2019-04-30 NOTE — Progress Notes (Signed)
    Achante Sturdy 05/17/1971 MD:[id number]        48 y.o.  [id number] for annual gynecologic exam.  Several issues noted below.  Past medical history,surgical history, problem list, medications, allergies, family history and social history were all reviewed and documented as reviewed in the EPIC chart.  ROS:  Performed with pertinent positives and negatives included in the history, assessment and plan.   Additional significant findings : None   Exam: Caryn Bee assistant Vitals:   04/30/19 1539  BP: 120/74  Weight: 151 lb (68.5 kg)  Height: 5\' 5"  (1.651 m)   Body mass index is 25.13 kg/m.  General appearance:  Normal affect, orientation and appearance. Skin: Grossly normal HEENT: Without gross lesions.  No cervical or supraclavicular adenopathy. Thyroid normal.  Lungs:  Clear without wheezing, rales or rhonchi Cardiac: RR, without RMG Abdominal:  Soft, nontender, without masses, guarding, rebound, organomegaly or hernia Breasts:  Examined lying and sitting without masses, retractions, discharge or axillary adenopathy.  Bilateral implants noted Pelvic:  Ext, BUS, Vagina: Normal  Cervix: Normal, Pap smear/HPV  Uterus: Anteverted, normal size, shape and contour, midline and mobile nontender   Adnexa: Without masses or tenderness    Anus and perineum: Normal   Rectovaginal: Normal sphincter tone without palpated masses or tenderness.    Assessment/Plan:  48 y.o. [id number] female for annual gynecologic exam.   1. Postmenopausal/HRT.  History of endometrial ablation in the past amenorrheic since then.  Currently on estradiol 0.075 mg patch and Prometrium 100 mg nightly as well as testosterone 2% cream.  She is being followed by integrative health and management of her HRT.  We discussed risks of HRT to include thrombosis such as stroke heart attack DVT and the breast cancer issue.  Also recently her son was diagnosed with a thrombotic disorder and they are in the process of evaluating.   They plan on doing genetic testing on the patient and the son's siblings.  I discussed with her that if she does have a genetic predisposition to thrombosis then that would be a relative contraindication to using HRT and that she would definitely need to discuss this with integrative therapy who manages her HRT.  Increased risk of thrombosis with continuing HRT and genetic defect discussed.  She will follow-up with the hematologist whose evaluating her son and ultimately with integrative health as needed. 2. Mammography scheduled this month and she will follow-up for this.  Breast exam normal today. 3. Pap smear/HPV 02/2015.  Pap smear/HPV today.  History of cryosurgery over 20 years ago with normal Pap smears since. 4. Colonoscopy 2014.  Repeat at their recommended interval. 5. Health maintenance.  No routine lab work done as patient does this elsewhere.  Follow-up 1 year, sooner as needed.   Anastasio Auerbach MD, 3:59 PM 04/30/2019

## 2019-04-30 NOTE — Addendum Note (Signed)
Addended by: Nelva Nay on: 04/30/2019 04:20 PM   Modules accepted: Orders

## 2019-05-01 LAB — PAP IG AND HPV HIGH-RISK: HPV DNA High Risk: NOT DETECTED

## 2019-05-07 ENCOUNTER — Ambulatory Visit
Admission: RE | Admit: 2019-05-07 | Discharge: 2019-05-07 | Disposition: A | Discharge status: Home / Self Care | Payer: 59 | Source: Ambulatory Visit | Attending: Gynecology | Admitting: Gynecology

## 2019-05-07 ENCOUNTER — Other Ambulatory Visit: Payer: Self-pay

## 2019-05-07 DIAGNOSIS — Z1231 Encounter for screening mammogram for malignant neoplasm of breast: Secondary | ICD-10-CM

## 2020-05-03 ENCOUNTER — Other Ambulatory Visit: Payer: Self-pay | Admitting: Obstetrics and Gynecology

## 2020-05-03 DIAGNOSIS — Z1231 Encounter for screening mammogram for malignant neoplasm of breast: Secondary | ICD-10-CM

## 2020-05-04 ENCOUNTER — Encounter: Payer: 59 | Admitting: Obstetrics and Gynecology

## 2020-05-11 ENCOUNTER — Encounter: Payer: 59 | Admitting: Obstetrics and Gynecology

## 2020-05-25 ENCOUNTER — Other Ambulatory Visit: Payer: Self-pay

## 2020-05-25 ENCOUNTER — Ambulatory Visit (INDEPENDENT_AMBULATORY_CARE_PROVIDER_SITE_OTHER): Payer: 59 | Admitting: Obstetrics and Gynecology

## 2020-05-25 ENCOUNTER — Encounter: Payer: Self-pay | Admitting: Obstetrics and Gynecology

## 2020-05-25 VITALS — BP 120/78 | Ht 65.0 in | Wt 155.0 lb

## 2020-05-25 DIAGNOSIS — Z01419 Encounter for gynecological examination (general) (routine) without abnormal findings: Secondary | ICD-10-CM

## 2020-05-25 DIAGNOSIS — Z79890 Hormone replacement therapy (postmenopausal): Secondary | ICD-10-CM | POA: Diagnosis not present

## 2020-05-25 DIAGNOSIS — Z13220 Encounter for screening for lipoid disorders: Secondary | ICD-10-CM | POA: Diagnosis not present

## 2020-05-25 DIAGNOSIS — Z1321 Encounter for screening for nutritional disorder: Secondary | ICD-10-CM | POA: Diagnosis not present

## 2020-05-25 NOTE — Progress Notes (Signed)
Lydia Jenkins 05-12-1971 [id number]  SUBJECTIVE:  49 y.o. G3P3003 female for annual routine gynecologic exam. Has had some occasional breast tenderness alternating sides, is active with physical activity using the upper body.  No nipple drainage.  She has no other gynecologic concerns.    Current Outpatient Medications  Medication Sig Dispense Refill  . Barberry-Oreg Grape-Goldenseal (BERBERINE COMPLEX PO) Take 1 tablet by mouth daily.    Marland Kitchen estradiol (VIVELLE-DOT) 0.075 MG/24HR Place 1 patch onto the skin 2 (two) times a week.    . Magnesium 400 MG TABS Take 400 mg by mouth daily.     . polyethylene glycol (MIRALAX / GLYCOLAX) packet Take 17 g by mouth daily.    . Probiotic Product (PROBIOTIC DAILY PO) Take 1 tablet by mouth daily.    . progesterone (PROMETRIUM) 100 MG capsule Take 500 mg by mouth daily.     . Testosterone Propionate (FIRST-TESTOSTERONE MC) 2 % CREA Place onto the skin.    Marland Kitchen thyroid (ARMOUR) 32.5 MG tablet Take 32.5 mg by mouth daily.    . TURMERIC PO Take 1 tablet by mouth daily.    Marland Kitchen VITAMIN D PO Take by mouth.     No current facility-administered medications for this visit.   Allergies: Adhesive [tape], Lortab [hydrocodone-acetaminophen], Morphine and related, Penicillins, and Percocet [oxycodone-acetaminophen]  No LMP recorded. Patient has had an ablation.  Past medical history,surgical history, problem list, medications, allergies, family history and social history were all reviewed and documented as reviewed in the EPIC chart.  ROS: Pertinent positives and negatives as reviewed in HPI   OBJECTIVE:  BP 120/78 (BP Location: Right Arm, Patient Position: Sitting, Cuff Size: Normal)   Ht 5\' 5"  (1.651 m)   Wt 155 lb (70.3 kg)   BMI 25.79 kg/m  The patient appears well, alert, oriented, in no distress. ENT normal.  Neck supple. No cervical or supraclavicular adenopathy or thyromegaly.  Lungs are clear, good air entry, no wheezes, rhonchi or rales. S1 and S2  normal, no murmurs, regular rate and rhythm.  Abdomen soft without tenderness, guarding, mass or organomegaly.  Neurological is normal, no focal findings.  BREAST EXAM: breasts appear normal, no suspicious masses, no skin or nipple changes or axillary nodes, bilateral implants noted, mild tenderness bilaterally near the areola  PELVIC EXAM: VULVA: normal appearing vulva with no masses, tenderness or lesions, VAGINA: normal appearing vagina with normal color and discharge, no lesions, CERVIX: normal appearing cervix without discharge or lesions, UTERUS: uterus is normal size, shape, consistency and nontender, ADNEXA: normal adnexa in size, nontender and no masses  Chaperone: Bonham present during the examination  ASSESSMENT:  49 y.o. 49 here for annual gynecologic exam  PLAN:   1. Postmenopausal/HRT.  History of endometrial ablation, amenorrheic.  Her HRT [facility] and she remains on estradiol 0.075 mg patch and Prometrium 100 mg nightly, also on testosterone 2% cream.  She will continue to follow-up with them in regards to HRT management and recommendations. 2. Pap smear/HPV 04/2019.  Cryosurgery over 20 years ago with normal Pap smear since.  Next Pap smear due 2025 at the 5-year interval 3. Mammogram scheduled 06/15/2020.  Normal breast exam today.  She performs SBE and has not noted any changes.  She is having some intermittent tenderness bilaterally which could be hormonal related versus musculoskeletal versus implant related.  She plans to have a hormone level evaluation at the integr[facility], and I encouraged her to keep monitor  on the symptoms and see if she can relate them to physical activity and that would be more indicative that it is musculoskeletal related.  Will notify us if having any persistent discomfort or if she notices any changes on SBE. 4. Colonoscopy 2014.  Follow up at the recommended interval.   5. DEXA never.  Would  plan when further into menopause. 6. Health maintenance.  Patient requests baseline labs so we will check CMP, CBC, lipid panel.  Normal thyroid screen within the last 1.5 years so will defer this.  Check vitamin D level to screen for deficiency.  Return annually or sooner, prn.  Theresia Majors MD 05/25/20

## 2020-05-26 LAB — COMPREHENSIVE METABOLIC PANEL
AG Ratio: 1.7 (calc) (ref 1.0–2.5)
ALT: 11 U/L (ref 6–29)
AST: 15 U/L (ref 10–35)
Albumin: 4.1 g/dL (ref 3.6–5.1)
Alkaline phosphatase (APISO): 57 U/L (ref 37–153)
BUN: 21 mg/dL (ref 7–25)
CO2: 24 mmol/L (ref 20–32)
Calcium: 9.3 mg/dL (ref 8.6–10.4)
Chloride: 104 mmol/L (ref 98–110)
Creat: 0.89 mg/dL (ref 0.50–1.05)
Globulin: 2.4 g/dL (calc) (ref 1.9–3.7)
Glucose, Bld: 80 mg/dL (ref 65–99)
Potassium: 4.5 mmol/L (ref 3.5–5.3)
Sodium: 137 mmol/L (ref 135–146)
Total Bilirubin: 0.5 mg/dL (ref 0.2–1.2)
Total Protein: 6.5 g/dL (ref 6.1–8.1)

## 2020-05-26 LAB — LIPID PANEL
Cholesterol: 165 mg/dL (ref <200)
HDL: 58 mg/dL (ref >=50)
LDL Cholesterol (Calc): 90 mg/dL (calc)
Non-HDL Cholesterol (Calc): 107 mg/dL (calc) (ref <130)
Total CHOL/HDL Ratio: 2.8 (calc) (ref <5.0)
Triglycerides: 82 mg/dL (ref <150)

## 2020-05-26 LAB — VITAMIN D 25 HYDROXY (VIT D DEFICIENCY, FRACTURES): Vit D, 25-Hydroxy: 48 ng/mL (ref 30–100)

## 2020-05-26 LAB — CBC
HCT: 41 % (ref 35.0–45.0)
Hemoglobin: 13.9 g/dL (ref 11.7–15.5)
MCH: 30.6 pg (ref 27.0–33.0)
MCHC: 33.9 g/dL (ref 32.0–36.0)
MCV: 90.3 fL (ref 80.0–100.0)
MPV: 9.7 fL (ref 7.5–12.5)
Platelets: 312 10*3/uL (ref 140–400)
RBC: 4.54 10*6/uL (ref 3.80–5.10)
RDW: 12.1 % (ref 11.0–15.0)
WBC: 6 10*3/uL (ref 3.8–10.8)

## 2020-06-15 ENCOUNTER — Ambulatory Visit: Payer: 59

## 2020-08-04 ENCOUNTER — Other Ambulatory Visit: Payer: Self-pay | Admitting: Obstetrics and Gynecology

## 2020-08-04 DIAGNOSIS — Z1231 Encounter for screening mammogram for malignant neoplasm of breast: Secondary | ICD-10-CM

## 2020-08-06 ENCOUNTER — Ambulatory Visit: Payer: 59

## 2020-08-11 ENCOUNTER — Other Ambulatory Visit: Payer: Self-pay

## 2020-08-11 ENCOUNTER — Ambulatory Visit
Admission: RE | Admit: 2020-08-11 | Discharge: 2020-08-11 | Disposition: A | Discharge status: Home / Self Care | Payer: 59 | Source: Ambulatory Visit | Attending: Obstetrics and Gynecology | Admitting: Obstetrics and Gynecology

## 2020-08-11 DIAGNOSIS — Z1231 Encounter for screening mammogram for malignant neoplasm of breast: Secondary | ICD-10-CM

## 2020-08-13 ENCOUNTER — Other Ambulatory Visit: Payer: Self-pay | Admitting: Obstetrics and Gynecology

## 2020-08-13 DIAGNOSIS — R928 Other abnormal and inconclusive findings on diagnostic imaging of breast: Secondary | ICD-10-CM

## 2020-09-01 ENCOUNTER — Ambulatory Visit: Payer: 59

## 2020-09-01 ENCOUNTER — Other Ambulatory Visit: Payer: Self-pay

## 2020-09-01 ENCOUNTER — Other Ambulatory Visit: Payer: Self-pay | Admitting: Obstetrics and Gynecology

## 2020-09-01 ENCOUNTER — Ambulatory Visit
Admission: RE | Admit: 2020-09-01 | Discharge: 2020-09-01 | Disposition: A | Discharge status: Home / Self Care | Payer: 59 | Source: Ambulatory Visit | Attending: Obstetrics and Gynecology | Admitting: Obstetrics and Gynecology

## 2020-09-01 DIAGNOSIS — R928 Other abnormal and inconclusive findings on diagnostic imaging of breast: Secondary | ICD-10-CM

## 2020-10-13 ENCOUNTER — Encounter (HOSPITAL_BASED_OUTPATIENT_CLINIC_OR_DEPARTMENT_OTHER): Payer: Self-pay | Admitting: Emergency Medicine

## 2020-10-13 ENCOUNTER — Emergency Department (HOSPITAL_BASED_OUTPATIENT_CLINIC_OR_DEPARTMENT_OTHER): Payer: 59

## 2020-10-13 ENCOUNTER — Other Ambulatory Visit: Payer: Self-pay

## 2020-10-13 ENCOUNTER — Inpatient Hospital Stay (HOSPITAL_BASED_OUTPATIENT_CLINIC_OR_DEPARTMENT_OTHER)
Admission: EM | Admit: 2020-10-13 | Discharge: 2020-10-15 | DRG: 054 | Disposition: A | Discharge status: Home / Self Care | Payer: 59 | Attending: Internal Medicine | Admitting: Internal Medicine

## 2020-10-13 DIAGNOSIS — D320 Benign neoplasm of cerebral meninges: Secondary | ICD-10-CM | POA: Diagnosis not present

## 2020-10-13 DIAGNOSIS — R519 Headache, unspecified: Secondary | ICD-10-CM | POA: Diagnosis present

## 2020-10-13 DIAGNOSIS — G936 Cerebral edema: Secondary | ICD-10-CM | POA: Diagnosis present

## 2020-10-13 DIAGNOSIS — Z888 Allergy status to other drugs, medicaments and biological substances status: Secondary | ICD-10-CM

## 2020-10-13 DIAGNOSIS — Z6826 Body mass index (BMI) 26.0-26.9, adult: Secondary | ICD-10-CM

## 2020-10-13 DIAGNOSIS — D496 Neoplasm of unspecified behavior of brain: Secondary | ICD-10-CM

## 2020-10-13 DIAGNOSIS — Z880 Allergy status to penicillin: Secondary | ICD-10-CM

## 2020-10-13 DIAGNOSIS — Z9049 Acquired absence of other specified parts of digestive tract: Secondary | ICD-10-CM

## 2020-10-13 DIAGNOSIS — E039 Hypothyroidism, unspecified: Secondary | ICD-10-CM | POA: Diagnosis present

## 2020-10-13 DIAGNOSIS — D72829 Elevated white blood cell count, unspecified: Secondary | ICD-10-CM | POA: Diagnosis not present

## 2020-10-13 DIAGNOSIS — R739 Hyperglycemia, unspecified: Secondary | ICD-10-CM | POA: Diagnosis not present

## 2020-10-13 DIAGNOSIS — G8929 Other chronic pain: Secondary | ICD-10-CM | POA: Diagnosis present

## 2020-10-13 DIAGNOSIS — R112 Nausea with vomiting, unspecified: Secondary | ICD-10-CM | POA: Diagnosis present

## 2020-10-13 DIAGNOSIS — Z8249 Family history of ischemic heart disease and other diseases of the circulatory system: Secondary | ICD-10-CM

## 2020-10-13 DIAGNOSIS — G9389 Other specified disorders of brain: Secondary | ICD-10-CM

## 2020-10-13 DIAGNOSIS — Y92239 Unspecified place in hospital as the place of occurrence of the external cause: Secondary | ICD-10-CM | POA: Diagnosis not present

## 2020-10-13 DIAGNOSIS — Z885 Allergy status to narcotic agent status: Secondary | ICD-10-CM

## 2020-10-13 DIAGNOSIS — J302 Other seasonal allergic rhinitis: Secondary | ICD-10-CM | POA: Diagnosis present

## 2020-10-13 DIAGNOSIS — Z833 Family history of diabetes mellitus: Secondary | ICD-10-CM

## 2020-10-13 DIAGNOSIS — T380X5A Adverse effect of glucocorticoids and synthetic analogues, initial encounter: Secondary | ICD-10-CM | POA: Diagnosis not present

## 2020-10-13 DIAGNOSIS — Z20822 Contact with and (suspected) exposure to covid-19: Secondary | ICD-10-CM | POA: Diagnosis present

## 2020-10-13 DIAGNOSIS — E663 Overweight: Secondary | ICD-10-CM | POA: Diagnosis present

## 2020-10-13 DIAGNOSIS — Z832 Family history of diseases of the blood and blood-forming organs and certain disorders involving the immune mechanism: Secondary | ICD-10-CM

## 2020-10-13 LAB — COMPREHENSIVE METABOLIC PANEL
ALT: 15 U/L (ref 0–44)
AST: 18 U/L (ref 15–41)
Albumin: 4 g/dL (ref 3.5–5.0)
Alkaline Phosphatase: 56 U/L (ref 38–126)
Anion gap: 10 (ref 5–15)
BUN: 21 mg/dL — ABNORMAL HIGH (ref 6–20)
CO2: 22 mmol/L (ref 22–32)
Calcium: 9 mg/dL (ref 8.9–10.3)
Chloride: 104 mmol/L (ref 98–111)
Creatinine, Ser: 0.69 mg/dL (ref 0.44–1.00)
GFR, Estimated: >60 mL/min (ref >60)
Glucose, Bld: 83 mg/dL (ref 70–99)
Potassium: 3.7 mmol/L (ref 3.5–5.1)
Sodium: 136 mmol/L (ref 135–145)
Total Bilirubin: 0.3 mg/dL (ref 0.3–1.2)
Total Protein: 7.1 g/dL (ref 6.5–8.1)

## 2020-10-13 LAB — CBC WITH DIFFERENTIAL/PLATELET
Abs Immature Granulocytes: 0.05 10*3/uL (ref 0.00–0.07)
Basophils Absolute: 0.1 10*3/uL (ref 0.0–0.1)
Basophils Relative: 1 %
Eosinophils Absolute: 0.1 10*3/uL (ref 0.0–0.5)
Eosinophils Relative: 1 %
HCT: 41.9 % (ref 36.0–46.0)
Hemoglobin: 14.2 g/dL (ref 12.0–15.0)
Immature Granulocytes: 1 %
Lymphocytes Relative: 20 %
Lymphs Abs: 1.8 10*3/uL (ref 0.7–4.0)
MCH: 29.8 pg (ref 26.0–34.0)
MCHC: 33.9 g/dL (ref 30.0–36.0)
MCV: 88 fL (ref 80.0–100.0)
Monocytes Absolute: 0.7 10*3/uL (ref 0.1–1.0)
Monocytes Relative: 8 %
Neutro Abs: 6.1 10*3/uL (ref 1.7–7.7)
Neutrophils Relative %: 69 %
Platelets: 312 10*3/uL (ref 150–400)
RBC: 4.76 MIL/uL (ref 3.87–5.11)
RDW: 12.3 % (ref 11.5–15.5)
WBC: 8.8 10*3/uL (ref 4.0–10.5)
nRBC: 0 % (ref 0.0–0.2)

## 2020-10-13 LAB — LIPASE, BLOOD: Lipase: 41 U/L (ref 11–51)

## 2020-10-13 MED ORDER — DEXAMETHASONE SODIUM PHOSPHATE 10 MG/ML IJ SOLN
6.0000 mg | Freq: Four times a day (QID) | INTRAMUSCULAR | Status: DC
  Administered 2020-10-13 – 2020-10-15 (×5): 6 mg via INTRAVENOUS
  Filled 2020-10-13 (×5): qty 1

## 2020-10-13 MED ORDER — IOHEXOL 300 MG/ML  SOLN
75.0000 mL | Freq: Once | INTRAMUSCULAR | Status: AC | PRN
  Administered 2020-10-13: 75 mL via INTRAVENOUS

## 2020-10-13 NOTE — ED Provider Notes (Signed)
[facility] EMERGENCY DEPARTMENT Provider Note   CSN: [id number] Arrival date & time: 10/13/20  1321     History Chief Complaint  Patient presents with  . Dizziness  . Headache    Lydia Jenkins is a 50 y.o. female.  Patient presents to the ED for evaluation of intermittent headache. Patient with history of migraines, has not had a migraine in 3 years. Current presentation is not typical of prior migraines. Patient was given a steroid dose pack several weeks ago for allergy symptoms, and headache improved. Headache returned after completing steroids. Patient went to an urgent care earlier, thinking she may have a sinus infection. She was sent to the ED for additional evaluation as she was complaining of nausea and dizziness. Headache has been intermittent for several weeks. It occurs randomly and is fleeting. It seems to be worse at night, and will awaken her from sleep. She denies any significant neurologic symptoms. She does endorse that she plays tennis regularly, but over the last several days has "missed the ball", which is unusual for her.  The history is provided by the patient. No language interpreter was used.  Dizziness Quality:  Room spinning Severity:  Moderate Timing:  Intermittent Progression:  Waxing and waning Chronicity:  New Associated symptoms: headaches and nausea   Headache Pain location:  L temporal and R temporal Onset quality:  Gradual Timing:  Intermittent Progression:  Waxing and waning Chronicity:  New Similar to prior headaches: no   Relieved by:  NSAIDs Associated symptoms: dizziness and nausea   Associated symptoms: no abdominal pain, no blurred vision, no facial pain, no fever, no focal weakness, no loss of balance and no photophobia        Past Medical History:  Diagnosis Date  . Abnormal Pap smear   . Anemia    iron deficient  . Anxiety   . Breast disorder    precencerous lesion, marker placed  . Complication of anesthesia    . Depression   . Family history of adverse reaction to anesthesia   . Hiatal hernia   . Liver dysfunction   . Menorrhagia   . Migraine headache with aura   . PONV (postoperative nausea and vomiting)     Patient Active Problem List   Diagnosis Date Noted  . Elevated LFTs   . Dilated cbd, acquired   . Abdominal pain 12/06/2017  . Hypothyroidism 12/06/2017  . Night sweats 03/11/2015  . S/P endometrial ablation 03/12/2013  . Premenstrual syndrome 03/12/2013  . Anemia 08/14/2012  . Pelvic pain in female 08/13/2012  . Menorrhagia     Past Surgical History:  Procedure Laterality Date  . ABDOMINOPLASTY  2010  . AUGMENTATION MAMMAPLASTY Bilateral 2009  . BILIARY STENT PLACEMENT N/A 12/10/2017   Procedure: BILIARY STENT PLACEMENT;  Surgeon: Carol Ada, MD;  Location: [facility];  Service: Endoscopy;  Laterality: N/A;  plastic 8.5 x 5 stent placed  . BREAST BIOPSY Left 2010   precancerous bx  . CRYOTHERAPY     cervix-3x  . ENDOMETRIAL ABLATION  2010  . ERCP N/A 12/10/2017   Procedure: ENDOSCOPIC RETROGRADE CHOLANGIOPANCREATOGRAPHY (ERCP);  Surgeon: Carol Ada, MD;  Location: [facility];  Service: Endoscopy;  Laterality: N/A;  Balloon sweep of duct  . LAPAROSCOPIC CHOLECYSTECTOMY  2008  . SPHINCTEROTOMY  12/10/2017   Procedure: SPHINCTEROTOMY;  Surgeon: Carol Ada, MD;  Location: [facility];  Service: Endoscopy;;  . [place]     OB History    Durenda Age  3   Para  3   Term  3   Preterm      AB      Living  3     SAB      IAB      Ectopic      Multiple      Live Births  3           Family History  Problem Relation Age of Onset  . Hypertension Mother   . Diabetes Mother 51       Type 2  . Anesthesia problems Mother        anesthetic complication at hysterectomy  . Hypertension Sister   . Diabetes Brother   . Clotting disorder Son     Social History   Tobacco Use  . Smoking status: Never Smoker  . Smokeless tobacco: Never  Used  Vaping Use  . Vaping Use: Never used  Substance Use Topics  . Alcohol use: Not Currently  . Drug use: No    Home Medications Prior to Admission medications   Medication Sig Start Date End Date Taking? Authorizing Provider  Barberry-Oreg Grape-Goldenseal (BERBERINE COMPLEX PO) Take 1 tablet by mouth daily.    [provider]  estradiol (VIVELLE-DOT) 0.075 MG/24HR Place 1 patch onto the skin 2 (two) times a week.    [provider]  Magnesium 400 MG TABS Take 400 mg by mouth daily.     [provider]  polyethylene glycol (MIRALAX / GLYCOLAX) packet Take 17 g by mouth daily. 12/11/17   Mariel Aloe, MD  Probiotic Product (PROBIOTIC DAILY PO) Take 1 tablet by mouth daily.    [provider]  progesterone (PROMETRIUM) 100 MG capsule Take 500 mg by mouth daily.     [provider]  Testosterone Propionate (FIRST-TESTOSTERONE MC) 2 % CREA Place onto the skin.    [provider]  thyroid (ARMOUR) 32.5 MG tablet Take 32.5 mg by mouth daily.    [provider]  TURMERIC PO Take 1 tablet by mouth daily.    [provider]  VITAMIN D PO Take by mouth.    [provider]    Allergies    Adhesive [tape], Lortab [hydrocodone-acetaminophen], Morphine and related, Penicillins, and Percocet [oxycodone-acetaminophen]  Review of Systems   Review of Systems  Constitutional: Negative for fever.  Eyes: Negative for blurred vision, photophobia and visual disturbance.  Gastrointestinal: Positive for nausea. Negative for abdominal pain.  Neurological: Positive for dizziness and headaches. Negative for focal weakness and loss of balance.  All other systems reviewed and are negative.   Physical Exam Updated Vital Signs BP (!) 151/118   Pulse 79   Temp 98.2 F (36.8 C) (Oral)   Resp 18   Ht 5\' 4"  (1.626 m)   Wt 70.3 kg   SpO2 98%   BMI 26.61 kg/m   Physical Exam Vitals and nursing note reviewed.   Constitutional:      General: She is not in acute distress.    Appearance: She is well-developed.  HENT:     Head: Normocephalic.     Mouth/Throat:     Mouth: Mucous membranes are moist.  Eyes:     Extraocular Movements: Extraocular movements intact.     Right eye: Normal extraocular motion and no nystagmus.     Left eye: Normal extraocular motion and no nystagmus.     Pupils: Pupils are equal, round, and reactive to light.  Cardiovascular:  Rate and Rhythm: Normal rate and regular rhythm.  Pulmonary:     Effort: Pulmonary effort is normal.     Breath sounds: Normal breath sounds.  Abdominal:     Palpations: Abdomen is soft.  Musculoskeletal:        General: Normal range of motion.     Cervical back: Neck supple.  Skin:    General: Skin is warm and dry.  Neurological:     Mental Status: She is alert.     GCS: GCS eye subscore is 4. GCS verbal subscore is 5. GCS motor subscore is 6.     Cranial Nerves: No cranial nerve deficit, dysarthria or facial asymmetry.     Sensory: No sensory deficit.     Motor: No weakness.     Coordination: Coordination normal.  Psychiatric:        Mood and Affect: Mood normal.        Behavior: Behavior normal.     ED Results / Procedures / Treatments   Labs (all labs ordered are listed, but only abnormal results are displayed) Labs Reviewed  COMPREHENSIVE METABOLIC PANEL - Abnormal; Notable for the following components:      Result Value   BUN 21 (*)    All other components within normal limits  CBC WITH DIFFERENTIAL/PLATELET  LIPASE, BLOOD    EKG EKG Interpretation  Date/Time:  Wednesday Oct 13 2020 14:03:19 EDT Ventricular Rate:  72 PR Interval:  106 QRS Duration: 74 QT Interval:  406 QTC Calculation: 444 R Axis:   73 Text Interpretation: Sinus rhythm with short PR Otherwise normal ECG When compared to prior, similar appearance. No STEMI Confirmed by Antony Blackbird [phone]) on 10/13/2020 5:05:02 PM   Radiology CT Head Wo  Contrast  Result Date: 10/13/2020 CLINICAL DATA:  Headache. EXAM: CT HEAD WITHOUT CONTRAST TECHNIQUE: Contiguous axial images were obtained from the base of the skull through the vertex without intravenous contrast. COMPARISON:  None. FINDINGS: Brain: There is an approximately 6 cm mass in the anterior left frontal region of mixed attenuation. There is a small amount of associated hyperostosis along the floor of the anterior cranial fossa, however it is unclear whether the mass is extra-axial or intra-axial. There is moderate surrounding vasogenic edema with regional mass effect including sulcal effacement and 1.4 cm of rightward midline shift. The frontal horns of the lateral ventricles are effaced. The suprasellar cistern is also effaced. No acute cortically based infarct, acute intracranial hemorrhage, or sizable extra-axial fluid collection is identified. Vascular: No hyperdense vessel. Skull: No fracture or suspicious osseous lesion. Sinuses/Orbits: Visualized paranasal sinuses and mastoid air cells are clear. Unremarkable orbits. Other: None. IMPRESSION: 6 cm anterior left frontal mass with moderate edema and 1.4 cm of rightward midline shift. Brain MRI without and with contrast is recommended for further evaluation. These results were called by telephone at the time of interpretation on 10/13/2020 at 5:55 p.m. to provider Etta Quill, who verbally acknowledged these results. Electronically Signed   By: Logan Bores M.D.   On: 10/13/2020 18:03   CT Chest W Contrast  Result Date: 10/13/2020 CLINICAL DATA:  Newly diagnosed brain neoplasm. Evaluate for possible primary malignancy. EXAM: CT CHEST, ABDOMEN, AND PELVIS WITH CONTRAST TECHNIQUE: Multidetector CT imaging of the chest, abdomen and pelvis was performed following the standard protocol during bolus administration of intravenous contrast. CONTRAST:  33mL OMNIPAQUE IOHEXOL 300 MG/ML  SOLN COMPARISON:  Noncontrast AP CT on 12/06/2017 FINDINGS: CT CHEST  FINDINGS Cardiovascular: No acute findings. Mediastinum/Lymph Nodes:  No masses or pathologically enlarged lymph nodes identified. Lungs/Pleura: No suspicious pulmonary nodules or masses identified. No evidence of infiltrate or pleural effusion. Musculoskeletal: Bilateral breast implants noted. No suspicious bone lesions identified. CT ABDOMEN AND PELVIS FINDINGS Hepatobiliary: No masses identified. Prior cholecystectomy. Chronic biliary ductal dilatation is unchanged. Pancreas:  No mass or inflammatory changes. Spleen:  Within normal limits in size and appearance. Adrenals/Urinary tract: No masses identified. A few tiny 1-2 mm renal calculi seen bilaterally, however there is no evidence of ureteral calculi or hydronephrosis. Unremarkable unopacified urinary bladder. Stomach/Bowel: Tiny hiatal hernia noted. No evidence of obstruction, inflammatory process, or abnormal fluid collections. Vascular/Lymphatic: No pathologically enlarged lymph nodes identified. No acute vascular findings. Reproductive:  No mass or other significant abnormality identified. Other:  None. Musculoskeletal:  No suspicious bone lesions identified. IMPRESSION: No evidence of malignancy or other acute findings within the chest, abdomen, or pelvis. Tiny hiatal hernia. Tiny bilateral renal calculi. No evidence of ureteral calculi or hydronephrosis. Electronically Signed   By: Marlaine Hind M.D.   On: 10/13/2020 20:35   CT ABDOMEN PELVIS W CONTRAST  Result Date: 10/13/2020 CLINICAL DATA:  Newly diagnosed brain neoplasm. Evaluate for possible primary malignancy. EXAM: CT CHEST, ABDOMEN, AND PELVIS WITH CONTRAST TECHNIQUE: Multidetector CT imaging of the chest, abdomen and pelvis was performed following the standard protocol during bolus administration of intravenous contrast. CONTRAST:  72mL OMNIPAQUE IOHEXOL 300 MG/ML  SOLN COMPARISON:  Noncontrast AP CT on 12/06/2017 FINDINGS: CT CHEST FINDINGS Cardiovascular: No acute findings.  Mediastinum/Lymph Nodes: No masses or pathologically enlarged lymph nodes identified. Lungs/Pleura: No suspicious pulmonary nodules or masses identified. No evidence of infiltrate or pleural effusion. Musculoskeletal: Bilateral breast implants noted. No suspicious bone lesions identified. CT ABDOMEN AND PELVIS FINDINGS Hepatobiliary: No masses identified. Prior cholecystectomy. Chronic biliary ductal dilatation is unchanged. Pancreas:  No mass or inflammatory changes. Spleen:  Within normal limits in size and appearance. Adrenals/Urinary tract: No masses identified. A few tiny 1-2 mm renal calculi seen bilaterally, however there is no evidence of ureteral calculi or hydronephrosis. Unremarkable unopacified urinary bladder. Stomach/Bowel: Tiny hiatal hernia noted. No evidence of obstruction, inflammatory process, or abnormal fluid collections. Vascular/Lymphatic: No pathologically enlarged lymph nodes identified. No acute vascular findings. Reproductive:  No mass or other significant abnormality identified. Other:  None. Musculoskeletal:  No suspicious bone lesions identified. IMPRESSION: No evidence of malignancy or other acute findings within the chest, abdomen, or pelvis. Tiny hiatal hernia. Tiny bilateral renal calculi. No evidence of ureteral calculi or hydronephrosis. Electronically Signed   By: Marlaine Hind M.D.   On: 10/13/2020 20:35    Procedures Procedures   Medications Ordered in ED Medications - No data to display  ED Course  I have reviewed the triage vital signs and the nursing notes.  Pertinent labs & imaging results that were available during my care of the patient were reviewed by me and considered in my medical decision making (see chart for details).  Spoke with Dr. Marcello Moores, Neurosurgery. Patient will need MRI. He requests patient to be admitted to hospitalist service at [facility]. CT of chest and abdomen pelvis with contrast to evaluate for metastasis. Decadron 6 mg q 6  hours.  Spoke with Dr. Tonie Griffith, hospitalist. Will admit patient.   MDM Rules/Calculators/A&P                           Final Clinical Impression(s) / ED Diagnoses Final diagnoses:  None    Rx /  DC Orders ED Discharge Orders    None       Etta Quill, NP 10/13/20 [id number]    Tegeler, Gwenyth Allegra, MD 10/15/20 [phone]

## 2020-10-13 NOTE — ED Notes (Signed)
Patient transported to CT 

## 2020-10-13 NOTE — ED Triage Notes (Signed)
Pt c/o allergy symptoms including HA, eye and ear itching, and cough. These have been going on x few months. Pt was seen by PCP and was given steroids. Pt completed steroids. Pt states that now on top of the symptoms she is having some dizziness and some nausea that comes in waves. Pt went to urgent care and due to the dizziness and nausea was told to come to the ER for a CT. Pt states that she took excedren this morning and it did help with HA. Pt aaox3, in wheelchair, VSS, GCS 15, NAD noted in triage.

## 2020-10-13 NOTE — ED Notes (Signed)
ED Provider at bedside. 

## 2020-10-14 ENCOUNTER — Other Ambulatory Visit: Payer: Self-pay

## 2020-10-14 ENCOUNTER — Inpatient Hospital Stay (HOSPITAL_COMMUNITY): Payer: 59

## 2020-10-14 ENCOUNTER — Encounter (HOSPITAL_COMMUNITY): Payer: Self-pay | Admitting: Family Medicine

## 2020-10-14 DIAGNOSIS — Z888 Allergy status to other drugs, medicaments and biological substances status: Secondary | ICD-10-CM | POA: Diagnosis not present

## 2020-10-14 DIAGNOSIS — D72829 Elevated white blood cell count, unspecified: Secondary | ICD-10-CM | POA: Diagnosis not present

## 2020-10-14 DIAGNOSIS — G9389 Other specified disorders of brain: Secondary | ICD-10-CM | POA: Diagnosis not present

## 2020-10-14 DIAGNOSIS — Y92239 Unspecified place in hospital as the place of occurrence of the external cause: Secondary | ICD-10-CM | POA: Diagnosis not present

## 2020-10-14 DIAGNOSIS — R112 Nausea with vomiting, unspecified: Secondary | ICD-10-CM | POA: Diagnosis present

## 2020-10-14 DIAGNOSIS — E039 Hypothyroidism, unspecified: Secondary | ICD-10-CM | POA: Diagnosis present

## 2020-10-14 DIAGNOSIS — Z9049 Acquired absence of other specified parts of digestive tract: Secondary | ICD-10-CM | POA: Diagnosis not present

## 2020-10-14 DIAGNOSIS — G8929 Other chronic pain: Secondary | ICD-10-CM | POA: Diagnosis not present

## 2020-10-14 DIAGNOSIS — K834 Spasm of sphincter of Oddi: Secondary | ICD-10-CM | POA: Diagnosis not present

## 2020-10-14 DIAGNOSIS — R519 Headache, unspecified: Secondary | ICD-10-CM | POA: Diagnosis not present

## 2020-10-14 DIAGNOSIS — D320 Benign neoplasm of cerebral meninges: Secondary | ICD-10-CM | POA: Diagnosis present

## 2020-10-14 DIAGNOSIS — E663 Overweight: Secondary | ICD-10-CM | POA: Diagnosis present

## 2020-10-14 DIAGNOSIS — Z880 Allergy status to penicillin: Secondary | ICD-10-CM | POA: Diagnosis not present

## 2020-10-14 DIAGNOSIS — Z8249 Family history of ischemic heart disease and other diseases of the circulatory system: Secondary | ICD-10-CM | POA: Diagnosis not present

## 2020-10-14 DIAGNOSIS — R739 Hyperglycemia, unspecified: Secondary | ICD-10-CM | POA: Diagnosis not present

## 2020-10-14 DIAGNOSIS — T380X5A Adverse effect of glucocorticoids and synthetic analogues, initial encounter: Secondary | ICD-10-CM | POA: Diagnosis not present

## 2020-10-14 DIAGNOSIS — Z885 Allergy status to narcotic agent status: Secondary | ICD-10-CM | POA: Diagnosis not present

## 2020-10-14 DIAGNOSIS — D496 Neoplasm of unspecified behavior of brain: Secondary | ICD-10-CM | POA: Diagnosis present

## 2020-10-14 DIAGNOSIS — J302 Other seasonal allergic rhinitis: Secondary | ICD-10-CM | POA: Diagnosis present

## 2020-10-14 DIAGNOSIS — Z833 Family history of diabetes mellitus: Secondary | ICD-10-CM | POA: Diagnosis not present

## 2020-10-14 DIAGNOSIS — G936 Cerebral edema: Secondary | ICD-10-CM | POA: Diagnosis present

## 2020-10-14 DIAGNOSIS — Z20822 Contact with and (suspected) exposure to covid-19: Secondary | ICD-10-CM | POA: Diagnosis present

## 2020-10-14 DIAGNOSIS — Z832 Family history of diseases of the blood and blood-forming organs and certain disorders involving the immune mechanism: Secondary | ICD-10-CM | POA: Diagnosis not present

## 2020-10-14 DIAGNOSIS — Z6826 Body mass index (BMI) 26.0-26.9, adult: Secondary | ICD-10-CM | POA: Diagnosis not present

## 2020-10-14 LAB — SARS CORONAVIRUS 2 (TAT 6-24 HRS): SARS Coronavirus 2: NEGATIVE

## 2020-10-14 LAB — COMPREHENSIVE METABOLIC PANEL
ALT: 18 U/L (ref 0–44)
AST: 19 U/L (ref 15–41)
Albumin: 4.2 g/dL (ref 3.5–5.0)
Alkaline Phosphatase: 49 U/L (ref 38–126)
Anion gap: 9 (ref 5–15)
BUN: 15 mg/dL (ref 6–20)
CO2: 22 mmol/L (ref 22–32)
Calcium: 9.8 mg/dL (ref 8.9–10.3)
Chloride: 107 mmol/L (ref 98–111)
Creatinine, Ser: 0.85 mg/dL (ref 0.44–1.00)
GFR, Estimated: >60 mL/min (ref >60)
Glucose, Bld: 117 mg/dL — ABNORMAL HIGH (ref 70–99)
Potassium: 4 mmol/L (ref 3.5–5.1)
Sodium: 138 mmol/L (ref 135–145)
Total Bilirubin: 0.9 mg/dL (ref 0.3–1.2)
Total Protein: 7.8 g/dL (ref 6.5–8.1)

## 2020-10-14 LAB — APTT: aPTT: 26 seconds (ref 24–36)

## 2020-10-14 LAB — PHOSPHORUS: Phosphorus: 3.7 mg/dL (ref 2.5–4.6)

## 2020-10-14 LAB — HEMOGLOBIN A1C
Hgb A1c MFr Bld: 5.4 % (ref 4.8–5.6)
Mean Plasma Glucose: 108.28 mg/dL

## 2020-10-14 LAB — PROTIME-INR
INR: 1 (ref 0.8–1.2)
Prothrombin Time: 12.6 s (ref 11.4–15.2)

## 2020-10-14 LAB — MAGNESIUM: Magnesium: 2.4 mg/dL (ref 1.7–2.4)

## 2020-10-14 LAB — HIV ANTIBODY (ROUTINE TESTING W REFLEX): HIV Screen 4th Generation wRfx: NONREACTIVE

## 2020-10-14 MED ORDER — POLYETHYLENE GLYCOL 3350 17 G PO PACK: 17.0000 g | PACK | Freq: Every day | ORAL | Status: DC | PRN

## 2020-10-14 MED ORDER — THYROID 30 MG PO TABS
30.0000 mg | ORAL_TABLET | Freq: Every day | ORAL | Status: DC
  Administered 2020-10-15: 30 mg via ORAL
  Filled 2020-10-14: qty 1

## 2020-10-14 MED ORDER — ONDANSETRON HCL 4 MG PO TABS: 4.0000 mg | ORAL_TABLET | Freq: Four times a day (QID) | ORAL | Status: DC | PRN

## 2020-10-14 MED ORDER — ACETAMINOPHEN 325 MG PO TABS: 650.0000 mg | ORAL_TABLET | Freq: Four times a day (QID) | ORAL | Status: DC | PRN

## 2020-10-14 MED ORDER — ACETAMINOPHEN 650 MG RE SUPP: 650.0000 mg | Freq: Four times a day (QID) | RECTAL | Status: DC | PRN

## 2020-10-14 MED ORDER — IOHEXOL 350 MG/ML SOLN
50.0000 mL | Freq: Once | INTRAVENOUS | Status: AC | PRN
  Administered 2020-10-14: 50 mL via INTRAVENOUS

## 2020-10-14 MED ORDER — GADOBUTROL 1 MMOL/ML IV SOLN
7.0000 mL | Freq: Once | INTRAVENOUS | Status: AC | PRN
  Administered 2020-10-14: 7 mL via INTRAVENOUS

## 2020-10-14 MED ORDER — ONDANSETRON HCL 4 MG/2ML IJ SOLN: 4.0000 mg | Freq: Four times a day (QID) | INTRAMUSCULAR | Status: DC | PRN

## 2020-10-14 MED ORDER — MELATONIN 5 MG PO TABS: 10.0000 mg | ORAL_TABLET | Freq: Every evening | ORAL | Status: DC | PRN

## 2020-10-14 MED ORDER — THYROID 30 MG PO TABS
32.5000 mg | ORAL_TABLET | Freq: Every day | ORAL | Status: DC
  Filled 2020-10-14 (×2): qty 1

## 2020-10-14 NOTE — Plan of Care (Signed)

## 2020-10-14 NOTE — Progress Notes (Signed)
Care started prior to midnight in the emergency room and patient was admitted early this morning after midnight and I am in current agreement with assessment and plan done by Dr. Adolph Pollack.  Additional changes to the plan of care been made accordingly.  The patient is a 50 year old overweight Caucasian female with a past medical history significant for but not limited to hypothyroidism, history of hiatal hernia, history of sphincter of Oddi dysfunction as well as other comorbidities who presented to the [facility] emergency department with an approximate 65-month history of worsening headaches and nausea.  She explains about 3 months ago she began to experience headaches and describes her headaches initially as mild in intensity as well and is dull in quality that radiated to her ears and the back of her head.  She did not seek medical attention at this time as she felt that this was due to her allergies but in the weeks and months that followed her symptoms progressively and gradually worsened.  She will wake up from her sleep with headaches and because her symptoms began to worsen she began to experience bouts of nausea to the point where her appetite was affected.  She denies any rhinorrhea, fevers or sick contacts and she denies any associated focal weakness, changes in vision or slurred speech.  Approximately 3 weeks ago patient spoke to a telemedicine provider who gave her a course of steroids with transient improvement but then her symptoms returned.  Because her symptoms continue to worsen she presented to the med center [facility] for emergency department for further evaluation.  Upon evaluation in the emergency department she had a noncontrast head CT scan which revealed a 6 cm left frontal mass with moderate amount of surrounding brain edema as well as associated 1.4 cm midline shift.  CT imaging of the chest abdomen pelvis was additionally performed which revealed no evidence of malignant  process.  She further underwent work-up and had an MRI done solitary large 6 to 5 mm diameter mass with lobulated margins it was centered at the left anterior cranial fossa that appeared to be both hypervascular extra-axial with although no dural tail identified there was associated hyper ostosis along the roof of the left orbit with a large meningioma being favored but a high-grade glioma and solitary metastasis being considerably less likely.  There is also regional mass-effect and relatively mild vasogenic edema primarily affecting the left anterior frontal lobe.  The case was discussed with normal surgery who recommended scheduled Decadron every 6 hours and she was transferred to [facility] for formal neurosurgical consultation.  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 Kathyrn Sheriff who will see the patient today -Continue n.p.o. until neurosurgery evaluates -Continue with supportive care and treatment of her nausea and headaches with analgesics and antiemetics  Hypothyroidism -Check TSH and resume home regimen of levothyroxine  We will continue to monitor the patient's clinical response to intervention and repeat blood work and imaging in the a.m. and follow-up on  neurosurgery recommendations

## 2020-10-14 NOTE — Consult Note (Signed)
CC: headache  HPI:     Patient is a 50 y.o. female with Sphincter of Odi dysfunction who presented to the emergency room for headache.  She was found to have a large 6.5 cm left frontal extra-axial mass consistent with meningioma with significant mass-effect.  Metastatic work-up was negative.  She has had some difficulties with coordination that she noticed when playing tennis, and severe nightly headaches that wake her up but has not noticed any speech difficulties or weakness.  She is right-handed.   Patient Active Problem List   Diagnosis Date Noted  . Chronic headaches 10/14/2020  . Frontal mass of brain 10/13/2020  . Elevated LFTs   . Dilated cbd, acquired   . Abdominal pain 12/06/2017  . Hypothyroidism 12/06/2017  . Night sweats 03/11/2015  . S/P endometrial ablation 03/12/2013  . Premenstrual syndrome 03/12/2013  . Anemia 08/14/2012  . Pelvic pain in female 08/13/2012  . Menorrhagia    Past Medical History:  Diagnosis Date  . Abnormal Pap smear   . Anemia    iron deficient  . Anxiety   . Breast disorder    precencerous lesion, marker placed  . Complication of anesthesia   . Depression   . Family history of adverse reaction to anesthesia   . Hiatal hernia   . Liver dysfunction   . Menorrhagia   . Migraine headache with aura   . PONV (postoperative nausea and vomiting)     Past Surgical History:  Procedure Laterality Date  . ABDOMINOPLASTY  2010  . AUGMENTATION MAMMAPLASTY Bilateral 2009  . BILIARY STENT PLACEMENT N/A 12/10/2017   Procedure: BILIARY STENT PLACEMENT;  Surgeon: Carol Ada, MD;  Location: [facility];  Service: Endoscopy;  Laterality: N/A;  plastic 8.5 x 5 stent placed  . BREAST BIOPSY Left 2010   precancerous bx  . CRYOTHERAPY     cervix-3x  . ENDOMETRIAL ABLATION  2010  . ERCP N/A 12/10/2017   Procedure: ENDOSCOPIC RETROGRADE CHOLANGIOPANCREATOGRAPHY (ERCP);  Surgeon: Carol Ada, MD;  Location: [facility];  Service: Endoscopy;   Laterality: N/A;  Balloon sweep of duct  . LAPAROSCOPIC CHOLECYSTECTOMY  2008  . SPHINCTEROTOMY  12/10/2017   Procedure: SPHINCTEROTOMY;  Surgeon: Carol Ada, MD;  Location: [facility];  Service: Endoscopy;;  . TUBAL LIGATION  1998    Medications Prior to Admission  Medication Sig Dispense Refill Last Dose  . aspirin-acetaminophen-caffeine (EXCEDRIN MIGRAINE) 250-250-65 MG tablet Take 2 tablets by mouth every 6 (six) hours as needed for headache.   10/13/2020 at Unknown time  . ARMOUR THYROID 30 MG tablet Take 30 mg by mouth every morning.     Marland Kitchen azelastine (ASTELIN) 0.1 % nasal spray Place 1 spray into both nostrils 2 (two) times daily.     Marland Kitchen azelastine (OPTIVAR) 0.05 % ophthalmic solution Apply 1 drop to eye 2 (two) times daily.     Jolyne Loa Grape-Goldenseal (BERBERINE COMPLEX PO) Take 1 tablet by mouth daily.     Marland Kitchen DEXILANT 60 MG capsule Take 60 mg by mouth daily.     Marland Kitchen estradiol (VIVELLE-DOT) 0.075 MG/24HR Place 1 patch onto the skin 2 (two) times a week.     . Magnesium 400 MG TABS Take 400 mg by mouth daily.      . methylPREDNISolone (MEDROL DOSEPAK) 4 MG TBPK tablet as directed. Per taper dose pack instructions     . polyethylene glycol (MIRALAX / GLYCOLAX) packet Take 17 g by mouth daily.     . predniSONE (DELTASONE) 20  MG tablet Take 40 mg by mouth daily. For 5 days     . PRESCRIPTION MEDICATION Apply 0.25 g topically See admin instructions. Testosterone 2% Cream Apply behind alternating knees as directed.     . Probiotic Product (PROBIOTIC DAILY PO) Take 1 tablet by mouth daily.     . progesterone (PROMETRIUM) 100 MG capsule Take 500 mg by mouth daily.      . Testosterone Propionate (FIRST-TESTOSTERONE MC) 2 % CREA Place onto the skin.     Marland Kitchen thyroid (ARMOUR) 32.5 MG tablet Take 32.5 mg by mouth daily.     . TURMERIC PO Take 1 tablet by mouth daily.     Marland Kitchen VITAMIN D PO Take by mouth.      Allergies  Allergen Reactions  . Adhesive [Tape] Other (See Comments)    Blisters   . Lortab [Hydrocodone-Acetaminophen] Itching  . Morphine And Related Other (See Comments)    Spasms  . Penicillins Hives    Has patient had a PCN reaction causing immediate rash, facial/tongue/throat swelling, SOB or lightheadedness with hypotension: No Has patient had a PCN reaction causing severe rash involving mucus membranes or skin necrosis: No Has patient had a PCN reaction that required hospitalization: No Has patient had a PCN reaction occurring within the last 10 years: No If all of the above answers are "NO", then may proceed with Cephalosporin use.   Marland Kitchen Percocet [Oxycodone-Acetaminophen] Itching    Social History   Tobacco Use  . Smoking status: Never Smoker  . Smokeless tobacco: Never Used  Substance Use Topics  . Alcohol use: Not Currently    Family History  Problem Relation Age of Onset  . Hypertension Mother   . Diabetes Mother 19       Type 2  . Anesthesia problems Mother        anesthetic complication at hysterectomy  . Hypertension Sister   . Diabetes Brother   . Clotting disorder Son      Review of Systems Pertinent items noted in HPI and remainder of comprehensive ROS otherwise negative.  Objective:   Patient Vitals for the past 8 hrs:  BP Temp Temp src Pulse Resp SpO2 Weight  10/14/20 0840 109/76 98.1 F (36.7 C) Oral 75 18 100 % --  10/14/20 0334 125/69 98.5 F (36.9 C) Oral 84 18 100 % 70 kg  10/14/20 0227 118/77 -- -- 86 18 96 % --   No intake/output data recorded. No intake/output data recorded.      General : Alert, cooperative, no distress, appears stated age   Head:  Normocephalic/atraumatic    Eyes: PERRL, conjunctiva/corneas clear, EOM's intact. Fundi could not be visualized Neck: Supple Chest:  Respirations unlabored Chest wall: no tenderness or deformity Heart: Regular rate and rhythm Abdomen: Soft, nontender and nondistended Extremities: warm and well-perfused Skin: normal turgor, color and texture Neurologic:  Alert,  oriented x 3.  Eyes open spontaneously. PERRL, EOMI, VFC, no facial droop. V1-3 intact.  No dysarthria, tongue protrusion symmetric.  CNII-XII intact. Normal strength, sensation and reflexes throughout.  No pronator drift, full strength in legs       Data Review MRI brain reviewed.  Large 6.5 cm left frontal meningioma involving the orbital roof.  The ACAs appear displaced medially and the carotid was displaced posteriorly.  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  Assessment:   Large 6.5 cm left frontal meningioma  Plan:  -I had a long discussion with the patient and her  family.  Given the size of her meningioma, I recommend surgical resection.  Given the vascularity and the size, it likely would be a fairly lengthy surgery of at least 6 hours.  I will try to arrange for surgery at the next available opportunity, which may be on 5/21. -I will order a CTA of her head given the significant vascularity as well as a CT BrainLab protocol. -Continue dexamethasone 6 mg every 6h -Okay to eat today

## 2020-10-14 NOTE — Progress Notes (Signed)
At 1400 Patient and husband stated "Neuro surgeon said he is discharging me home with a prescription for my steroid, he said he could do surgery Saturday, but would prefer to do it Thursday and that there is no need for me to stay here until then, and that he said it won't be in the next fifteen minutes, but that I can go home today"   As of 1800, No DC orders placed, contacted attending MD to inquire, Attending had not discussed DC of patient with Neuro Sx, so no new orders at this time. Patient updated.

## 2020-10-14 NOTE — H&P (Signed)
History and Physical    Martrice Apt GEX:[id number] DOB: 25-Nov-1970 DOA: 10/13/2020  PCP: Tamala Julian, MD  Patient coming from: [facility]   Chief Complaint:  Chief Complaint  Patient presents with  . Dizziness  . Headache     HPI:  50 year old female with past medical history of hypothyroidism, hiatal hernia, sphincter of Oddi dysfunction presenting to [place] emergency department with an approximate 3-month history of worsening headaches and nausea.  Patient explains that approximately 3 months ago she began to experience headaches.  Patient describes his headaches as initially as mild in intensity, dull in quality and radiating to the ears and the back of the head.  Patient did not seek medical attention and felt that these were likely due to her "allergies."  In the weeks and months that followed, patient symptoms gradually worsened.  Patient notes that she would frequently wake up from sleep with headaches.  As patient symptoms continue to worsen she began to experience associated bouts of nausea in the point where her appetite was affected.  Patient denies any rhinorrhea, fevers, sick contacts.  Patient denies any associated focal weakness, changes in vision, slurred speech, facial droop or loss of balance.  Approximately 3 weeks prior to her presentation patient spoke to a telemedicine provider who prescribed her a course of steroids I agree with the patient that it was likely seasonal allergies.  Patient reports that her symptoms did transiently improve with this course of steroids but as soon as the course of steroids was complete her her symptoms returned.  Patient symptoms continued to worsen until she eventually presented to [place] emergency department for evaluation.  Upon evaluation at [facility] emergency department, noncontrast CT imaging of the head was performed revealing a 6 cm left frontal mass with moderate amount of surrounding  brain edema as well as an associated 1.4 cm midline shift.  CT imaging of the chest abdomen and pelvis was additionally performed which revealed no evidence of a malignant process.  ER provider discussed case with Dr. Marcello Moores recommended scheduled Decadron every 6 hours and transferred to [facility] for formal neurosurgery consultation.  The hospitalist group was then called to assess the patient for admission to the hospital.  Review of Systems:   Review of Systems  Gastrointestinal: Positive for nausea.  Neurological: Positive for headaches.  All other systems reviewed and are negative.   Past Medical History:  Diagnosis Date  . Abnormal Pap smear   . Anemia    iron deficient  . Anxiety   . Breast disorder    precencerous lesion, marker placed  . Complication of anesthesia   . Depression   . Family history of adverse reaction to anesthesia   . Hiatal hernia   . Liver dysfunction   . Menorrhagia   . Migraine headache with aura   . PONV (postoperative nausea and vomiting)     Past Surgical History:  Procedure Laterality Date  . ABDOMINOPLASTY  2010  . AUGMENTATION MAMMAPLASTY Bilateral 2009  . BILIARY STENT PLACEMENT N/A 12/10/2017   Procedure: BILIARY STENT PLACEMENT;  Surgeon: Carol Ada, MD;  Location: [facility] ENDOSCOPY;  Service: Endoscopy;  Laterality: N/A;  plastic 8.5 x 5 stent placed  . BREAST BIOPSY Left 2010   precancerous bx  . CRYOTHERAPY     cervix-3x  . ENDOMETRIAL ABLATION  2010  . ERCP N/A 12/10/2017   Procedure: ENDOSCOPIC RETROGRADE CHOLANGIOPANCREATOGRAPHY (ERCP);  Surgeon: Carol Ada, MD;  Location: [facility] ENDOSCOPY;  Service: Endoscopy;  Laterality: N/A;  Balloon sweep of duct  . LAPAROSCOPIC CHOLECYSTECTOMY  2008  . SPHINCTEROTOMY  12/10/2017   Procedure: SPHINCTEROTOMY;  Surgeon: Carol Ada, MD;  Location: [facility] ENDOSCOPY;  Service: Endoscopy;;  . Isabella     reports that she has never smoked. She has never used smokeless tobacco.  She reports previous alcohol use. She reports that she does not use drugs.  Allergies  Allergen Reactions  . Adhesive [Tape] Other (See Comments)    Blisters  . Lortab [Hydrocodone-Acetaminophen] Itching  . Morphine And Related     Spasms  . Penicillins Hives    Has patient had a PCN reaction causing immediate rash, facial/tongue/throat swelling, SOB or lightheadedness with hypotension: No Has patient had a PCN reaction causing severe rash involving mucus membranes or skin necrosis: No Has patient had a PCN reaction that required hospitalization: No Has patient had a PCN reaction occurring within the last 10 years: No If all of the above answers are "NO", then may proceed with Cephalosporin use.   Marland Kitchen Percocet [Oxycodone-Acetaminophen] Itching    Family History  Problem Relation Age of Onset  . Hypertension Mother   . Diabetes Mother 35       Type 2  . Anesthesia problems Mother        anesthetic complication at hysterectomy  . Hypertension Sister   . Diabetes Brother   . Clotting disorder Son      Prior to Admission medications   Medication Sig Start Date End Date Taking? Authorizing Provider  Barberry-Oreg Grape-Goldenseal (BERBERINE COMPLEX PO) Take 1 tablet by mouth daily.    [provider]  estradiol (VIVELLE-DOT) 0.075 MG/24HR Place 1 patch onto the skin 2 (two) times a week.    [provider]  Magnesium 400 MG TABS Take 400 mg by mouth daily.     [provider]  polyethylene glycol (MIRALAX / GLYCOLAX) packet Take 17 g by mouth daily. 12/11/17   Mariel Aloe, MD  Probiotic Product (PROBIOTIC DAILY PO) Take 1 tablet by mouth daily.    [provider]  progesterone (PROMETRIUM) 100 MG capsule Take 500 mg by mouth daily.     [provider]  Testosterone Propionate (FIRST-TESTOSTERONE MC) 2 % CREA Place onto the skin.    [provider]  thyroid (ARMOUR) 32.5 MG tablet Take 32.5 mg by mouth daily.    [provider]  TURMERIC PO Take 1 tablet by mouth daily.    [provider]  VITAMIN D PO Take by mouth.    [provider]    Physical Exam: Vitals:   10/13/20 2145 10/14/20 0002 10/14/20 0227 10/14/20 0334  BP: 124/68 129/79 118/77 125/69  Pulse: 92 85 86 84  Resp: 18 20 18 18   Temp:    98.5 F (36.9 C)  TempSrc:    Oral  SpO2: 98% 100% 96% 100%  Weight:    70 kg  Height:        Constitutional: Awake alert and oriented x3, no associated distress.   Skin: no rashes, no lesions, good skin turgor noted. Eyes: Pupils are equally reactive to light.  No evidence of scleral icterus or conjunctival pallor.  ENMT: Moist mucous membranes noted.  Posterior pharynx clear of any exudate or lesions.   Neck: normal, supple, no masses, no thyromegaly.  No evidence of jugular venous distension.   Respiratory: clear to auscultation bilaterally, no wheezing, no crackles.  Normal respiratory effort. No accessory muscle use.  Cardiovascular: Regular rate and rhythm, no murmurs / rubs / gallops. No extremity edema. 2+ pedal pulses. No carotid bruits.  Chest:   Nontender without crepitus or deformity.   Back:   Nontender without crepitus or deformity. Abdomen: Abdomen is soft and nontender.  No evidence of intra-abdominal masses.  Positive bowel sounds noted in all quadrants.   Musculoskeletal: No joint deformity upper and lower extremities. Good ROM, no contractures. Normal muscle tone.  Neurologic: CN 2-12 grossly intact. Sensation intact.  Patient moving all 4 extremities spontaneously.  Patient is following all commands.  Patient is responsive to verbal stimuli.   Psychiatric: Patient exhibits normal mood with appropriate affect.  Patient seems to possess insight as to their current situation.     Labs on Admission: I have personally reviewed following labs and imaging studies -   CBC: Recent Labs  Lab 10/13/20 1358  WBC 8.8  NEUTROABS 6.1  HGB 14.2  HCT 41.9  MCV 88.0   PLT 123456   Basic Metabolic Panel: Recent Labs  Lab 10/13/20 1358  NA 136  K 3.7  CL 104  CO2 22  GLUCOSE 83  BUN 21*  CREATININE 0.69  CALCIUM 9.0   GFR: Estimated Creatinine Clearance: 79 mL/min (by C-G formula based on SCr of 0.69 mg/dL). Liver Function Tests: Recent Labs  Lab 10/13/20 1358  AST 18  ALT 15  ALKPHOS 56  BILITOT 0.3  PROT 7.1  ALBUMIN 4.0   Recent Labs  Lab 10/13/20 1358  LIPASE 41   No results for input(s): AMMONIA in the last 168 hours. Coagulation Profile: No results for input(s): INR, PROTIME in the last 168 hours. Cardiac Enzymes: No results for input(s): CKTOTAL, CKMB, CKMBINDEX, TROPONINI in the last 168 hours. BNP (last 3 results) No results for input(s): PROBNP in the last 8760 hours. HbA1C: No results for input(s): HGBA1C in the last 72 hours. CBG: No results for input(s): GLUCAP in the last 168 hours. Lipid Profile: No results for input(s): CHOL, HDL, LDLCALC, TRIG, CHOLHDL, LDLDIRECT in the last 72 hours. Thyroid Function Tests: No results for input(s): TSH, T4TOTAL, FREET4, T3FREE, THYROIDAB in the last 72 hours. Anemia Panel: No results for input(s): VITAMINB12, FOLATE, FERRITIN, TIBC, IRON, RETICCTPCT in the last 72 hours. Urine analysis:    Component Value Date/Time   COLORURINE YELLOW 12/08/2017 1515   APPEARANCEUR CLEAR 12/08/2017 1515   LABSPEC 1.012 12/08/2017 1515   PHURINE 8.0 12/08/2017 1515   GLUCOSEU NEGATIVE 12/08/2017 1515   HGBUR NEGATIVE 12/08/2017 1515   BILIRUBINUR NEGATIVE 12/08/2017 1515   BILIRUBINUR - 03/12/2013 [place] 12/08/2017 1515   PROTEINUR NEGATIVE 12/08/2017 1515   UROBILINOGEN negative 03/12/2013 1558   NITRITE NEGATIVE 12/08/2017 1515   LEUKOCYTESUR NEGATIVE 12/08/2017 1515    Radiological Exams on Admission - Personally Reviewed: CT Head Wo Contrast  Result Date: 10/13/2020 CLINICAL DATA:  Headache. EXAM: CT HEAD WITHOUT CONTRAST TECHNIQUE: Contiguous axial images  were obtained from the base of the skull through the vertex without intravenous contrast. COMPARISON:  None. FINDINGS: Brain: There is an approximately 6 cm mass in the anterior left frontal region of mixed attenuation. There is a small amount of associated hyperostosis along the floor of the anterior cranial fossa, however it is unclear whether the mass is extra-axial or intra-axial. There is moderate surrounding vasogenic edema with regional mass effect including sulcal effacement and 1.4 cm of rightward midline shift. The frontal horns of the  lateral ventricles are effaced. The suprasellar cistern is also effaced. No acute cortically based infarct, acute intracranial hemorrhage, or sizable extra-axial fluid collection is identified. Vascular: No hyperdense vessel. Skull: No fracture or suspicious osseous lesion. Sinuses/Orbits: Visualized paranasal sinuses and mastoid air cells are clear. Unremarkable orbits. Other: None. IMPRESSION: 6 cm anterior left frontal mass with moderate edema and 1.4 cm of rightward midline shift. Brain MRI without and with contrast is recommended for further evaluation. These results were called by telephone at the time of interpretation on 10/13/2020 at 5:55 p.m. to provider Etta Quill, who verbally acknowledged these results. Electronically Signed   By: Logan Bores M.D.   On: 10/13/2020 18:03   CT Chest W Contrast  Result Date: 10/13/2020 CLINICAL DATA:  Newly diagnosed brain neoplasm. Evaluate for possible primary malignancy. EXAM: CT CHEST, ABDOMEN, AND PELVIS WITH CONTRAST TECHNIQUE: Multidetector CT imaging of the chest, abdomen and pelvis was performed following the standard protocol during bolus administration of intravenous contrast. CONTRAST:  78mL OMNIPAQUE IOHEXOL 300 MG/ML  SOLN COMPARISON:  Noncontrast AP CT on 12/06/2017 FINDINGS: CT CHEST FINDINGS Cardiovascular: No acute findings. Mediastinum/Lymph Nodes: No masses or pathologically enlarged lymph nodes identified.  Lungs/Pleura: No suspicious pulmonary nodules or masses identified. No evidence of infiltrate or pleural effusion. Musculoskeletal: Bilateral breast implants noted. No suspicious bone lesions identified. CT ABDOMEN AND PELVIS FINDINGS Hepatobiliary: No masses identified. Prior cholecystectomy. Chronic biliary ductal dilatation is unchanged. Pancreas:  No mass or inflammatory changes. Spleen:  Within normal limits in size and appearance. Adrenals/Urinary tract: No masses identified. A few tiny 1-2 mm renal calculi seen bilaterally, however there is no evidence of ureteral calculi or hydronephrosis. Unremarkable unopacified urinary bladder. Stomach/Bowel: Tiny hiatal hernia noted. No evidence of obstruction, inflammatory process, or abnormal fluid collections. Vascular/Lymphatic: No pathologically enlarged lymph nodes identified. No acute vascular findings. Reproductive:  No mass or other significant abnormality identified. Other:  None. Musculoskeletal:  No suspicious bone lesions identified. IMPRESSION: No evidence of malignancy or other acute findings within the chest, abdomen, or pelvis. Tiny hiatal hernia. Tiny bilateral renal calculi. No evidence of ureteral calculi or hydronephrosis. Electronically Signed   By: Marlaine Hind M.D.   On: 10/13/2020 20:35   MR Brain W and Wo Contrast  Result Date: 10/14/2020 CLINICAL DATA:  50 year old female with headache and large anterior brain mass on noncontrast head CT yesterday. EXAM: MRI HEAD WITHOUT AND WITH CONTRAST TECHNIQUE: Multiplanar, multiecho pulse sequences of the brain and surrounding structures were obtained without and with intravenous contrast. CONTRAST:  40mL GADAVIST GADOBUTROL 1 MMOL/ML IV SOLN COMPARISON:  Head CT 10/13/2020. FINDINGS: Brain: Large, round mass with lobulated margins is T2 and FLAIR hyperintense with vivid enhancement mass centered at the left anterior cranial fossa encompasses 65 x 59 x 59 mm (AP by transverse by CC) and appears to be  extra-axial on the coronal T2 imaging (series 17, image 24). The mass is generally facilitated on diffusion, and has a central area of signal heterogeneity associated with a spoke wheel pattern of internal vascularity (series 10 image 12, image 15, series 18, image 31. But there is no dural tail or broad area of dural thickening superimposed on the mass. Mild hemosiderin or mineralization within the mass. Mass effect on the brain is associated with up to 16 mm of midline shift at the anterior interhemispheric fissure. There is posterior mass effect on the frontal horns. There is subtotal effacement of the suprasellar cistern. And there is cytotoxic edema in the anterior frontal  lobes, primarily the left which is relatively mild given the large lesion size. No other abnormal intracranial enhancement or cerebral edema. No superimposed restricted diffusion to suggest acute infarction. No ventriculomegaly, acute intracranial hemorrhage. Cervicomedullary junction and pituitary remain within normal limits. Vascular: Major intracranial vascular flow voids are preserved. No prominent flow void vascularity along the posterior margin of the anterior brain mass on series 10, image 15 and series 17, image 21. The major dural venous sinuses are enhancing and appear to be patent. Skull and upper cervical spine: Mild hyperostosis over the left orbital roof is associated with the mass as demonstrated by CT. Negative visible cervical spine and spinal cord. Visualized bone marrow signal is within normal limits. Sinuses/Orbits: Downward mass effect on the optic chiasm. Intraorbital soft tissues are normal. Mild left maxillary sinus mucosal thickening. Other: Mastoids are clear. Visible internal auditory structures appear normal. IMPRESSION: 1. Solitary large 65 mm diameter mass with lobulated margins is centered at the left anterior cranial fossa and appears to be both hypervascular and extra-axial. Although no dural tail is  identified there is mild associated hyperostosis along the roof of the left orbit. 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 High-grade glioma and solitary metastasis are considerably less likely. 2. Regional mass effect. Relatively mild vasogenic edema primarily affecting the left anterior frontal lobe. Electronically Signed   By: Genevie Ann M.D.   On: 10/14/2020 06:50   CT ABDOMEN PELVIS W CONTRAST  Result Date: 10/13/2020 CLINICAL DATA:  Newly diagnosed brain neoplasm. Evaluate for possible primary malignancy. EXAM: CT CHEST, ABDOMEN, AND PELVIS WITH CONTRAST TECHNIQUE: Multidetector CT imaging of the chest, abdomen and pelvis was performed following the standard protocol during bolus administration of intravenous contrast. CONTRAST:  23mL OMNIPAQUE IOHEXOL 300 MG/ML  SOLN COMPARISON:  Noncontrast AP CT on 12/06/2017 FINDINGS: CT CHEST FINDINGS Cardiovascular: No acute findings. Mediastinum/Lymph Nodes: No masses or pathologically enlarged lymph nodes identified. Lungs/Pleura: No suspicious pulmonary nodules or masses identified. No evidence of infiltrate or pleural effusion. Musculoskeletal: Bilateral breast implants noted. No suspicious bone lesions identified. CT ABDOMEN AND PELVIS FINDINGS Hepatobiliary: No masses identified. Prior cholecystectomy. Chronic biliary ductal dilatation is unchanged. Pancreas:  No mass or inflammatory changes. Spleen:  Within normal limits in size and appearance. Adrenals/Urinary tract: No masses identified. A few tiny 1-2 mm renal calculi seen bilaterally, however there is no evidence of ureteral calculi or hydronephrosis. Unremarkable unopacified urinary bladder. Stomach/Bowel: Tiny hiatal hernia noted. No evidence of obstruction, inflammatory process, or abnormal fluid collections. Vascular/Lymphatic: No pathologically enlarged lymph nodes identified. No acute vascular findings. Reproductive:  No mass or other significant abnormality identified. Other:  None. Musculoskeletal:  No  suspicious bone lesions identified. IMPRESSION: No evidence of malignancy or other acute findings within the chest, abdomen, or pelvis. Tiny hiatal hernia. Tiny bilateral renal calculi. No evidence of ureteral calculi or hydronephrosis. Electronically Signed   By: Marlaine Hind M.D.   On: 10/13/2020 20:35    EKG: Personally reviewed.  Rhythm is normal sinus rhythm with heart rate of 72 bpm.  No dynamic ST segment changes appreciated.  Assessment/Plan Principal Problem:   Frontal mass of brain   Patient presenting with several month history of progressive headaches and nausea  CT imaging revealing 6 cm left frontal mass with associated brain edema and 1.4 cm midline shift  Upon arrival to the medical floor at [facility], we have continued the recommended regimen of Decadron every 6 hours  Performing serial neurologic checks  MRI brain suggestive of a very  large meningioma with again redemonstration of edema and midline shift.  High-grade glioma is considered much less likely according to radiology.  Obtaining formal neurosurgery consultation in the morning, their input is appreciated  We will keep patient n.p.o. until plan can be discussed with neurosurgery in the morning in case any intervention is pursued today.  Treating associated symptoms of nausea and headaches.  Analgesics and antiemetics.  Active Problems:   Hypothyroidism   Continue home regimen of levothyroxine   Code Status:  Full code Family Communication: Case has been discussed with both husband and daughter at the bedside who has been updated on plan of care.  Status is: Inpatient  Remains inpatient appropriate because:Ongoing diagnostic testing needed not appropriate for outpatient work up, IV treatments appropriate due to intensity of illness or inability to take PO and Inpatient level of care appropriate due to severity of illness   Dispo: The patient is from: Home              Anticipated d/c is to: Home               Patient currently is not medically stable to d/c.   Difficult to place patient No        Vernelle Emerald MD [facility] Pager [phone]  If 7PM-7AM, please contact night-coverage [email or web address] Use universal [place] password for that web site. If you do not have the password, please call the hospital operator.  10/14/2020, 7:57 AM

## 2020-10-15 ENCOUNTER — Other Ambulatory Visit: Payer: Self-pay | Admitting: Neurosurgery

## 2020-10-15 DIAGNOSIS — D72829 Elevated white blood cell count, unspecified: Secondary | ICD-10-CM

## 2020-10-15 DIAGNOSIS — K834 Spasm of sphincter of Oddi: Secondary | ICD-10-CM

## 2020-10-15 DIAGNOSIS — R739 Hyperglycemia, unspecified: Secondary | ICD-10-CM

## 2020-10-15 LAB — COMPREHENSIVE METABOLIC PANEL
ALT: 14 U/L (ref 0–44)
AST: 13 U/L — ABNORMAL LOW (ref 15–41)
Albumin: 3.5 g/dL (ref 3.5–5.0)
Alkaline Phosphatase: 45 U/L (ref 38–126)
Anion gap: 8 (ref 5–15)
BUN: 16 mg/dL (ref 6–20)
CO2: 23 mmol/L (ref 22–32)
Calcium: 9.3 mg/dL (ref 8.9–10.3)
Chloride: 106 mmol/L (ref 98–111)
Creatinine, Ser: 0.83 mg/dL (ref 0.44–1.00)
GFR, Estimated: >60 mL/min (ref >60)
Glucose, Bld: 145 mg/dL — ABNORMAL HIGH (ref 70–99)
Potassium: 4.1 mmol/L (ref 3.5–5.1)
Sodium: 137 mmol/L (ref 135–145)
Total Bilirubin: 0.6 mg/dL (ref 0.3–1.2)
Total Protein: 6.3 g/dL — ABNORMAL LOW (ref 6.5–8.1)

## 2020-10-15 LAB — CBC WITH DIFFERENTIAL/PLATELET
Abs Immature Granulocytes: 0.08 10*3/uL — ABNORMAL HIGH (ref 0.00–0.07)
Basophils Absolute: 0 10*3/uL (ref 0.0–0.1)
Basophils Relative: 0 %
Eosinophils Absolute: 0 10*3/uL (ref 0.0–0.5)
Eosinophils Relative: 0 %
HCT: 38.1 % (ref 36.0–46.0)
Hemoglobin: 12.9 g/dL (ref 12.0–15.0)
Immature Granulocytes: 1 %
Lymphocytes Relative: 6 %
Lymphs Abs: 0.9 10*3/uL (ref 0.7–4.0)
MCH: 29.7 pg (ref 26.0–34.0)
MCHC: 33.9 g/dL (ref 30.0–36.0)
MCV: 87.6 fL (ref 80.0–100.0)
Monocytes Absolute: 0.6 10*3/uL (ref 0.1–1.0)
Monocytes Relative: 4 %
Neutro Abs: 13 10*3/uL — ABNORMAL HIGH (ref 1.7–7.7)
Neutrophils Relative %: 89 %
Platelets: 321 10*3/uL (ref 150–400)
RBC: 4.35 MIL/uL (ref 3.87–5.11)
RDW: 11.9 % (ref 11.5–15.5)
WBC: 14.5 10*3/uL — ABNORMAL HIGH (ref 4.0–10.5)
nRBC: 0 % (ref 0.0–0.2)

## 2020-10-15 LAB — PHOSPHORUS: Phosphorus: 2 mg/dL — ABNORMAL LOW (ref 2.5–4.6)

## 2020-10-15 LAB — MAGNESIUM: Magnesium: 2.3 mg/dL (ref 1.7–2.4)

## 2020-10-15 MED ORDER — DEXAMETHASONE 4 MG PO TABS: 4.0000 mg | ORAL_TABLET | Freq: Three times a day (TID) | ORAL | 0 refills | Status: DC

## 2020-10-15 MED ORDER — ONDANSETRON HCL 4 MG PO TABS
4.0000 mg | ORAL_TABLET | Freq: Four times a day (QID) | ORAL | 0 refills | Status: AC | PRN
Start: 2020-10-15 — End: ?

## 2020-10-15 MED ORDER — K PHOS MONO-SOD PHOS DI & MONO 155-852-130 MG PO TABS
500.0000 mg | ORAL_TABLET | Freq: Two times a day (BID) | ORAL | Status: DC
  Administered 2020-10-15: 500 mg via ORAL
  Filled 2020-10-15: qty 2

## 2020-10-15 NOTE — Discharge Summary (Signed)
Physician Discharge Summary  Lydia Jenkins [id number] DOB: 1970/08/15 DOA: 10/13/2020  PCP: Tamala Julian, MD  Admit date: 10/13/2020 Discharge date: 10/15/2020  Admitted From: Home Disposition: Home  Recommendations for Outpatient Follow-up:  1. Follow up with PCP in 1-2 weeks 2. Follow up with NeuroSurgery Dr. Marcello Moores within 1 week for Surgical Intervention on 10/21/20 3. Please obtain CMP/CBC, Mag, Phos in one week 4. Please follow up on the following pending results:  Home Health: No Equipment/Devices: None  Discharge Condition: Stable  CODE STATUS: FULL CODE Diet recommendation: Heart Healthy Diet   Brief/Interim Summary:   The patient is a 50 year old overweight Caucasian female with a past medical history significant for but not limited to hypothyroidism, history of hiatal hernia, history of sphincter of Oddi dysfunction as well as other comorbidities who presented to the [place] emergency department with an approximate 5-month history of worsening headaches and nausea.  She explains about 3 months ago she began to experience headaches and describes her headaches initially as mild in intensity as well and is dull in quality that radiated to her ears and the back of her head.  She did not seek medical attention at this time as she felt that this was due to her allergies but in the weeks and months that followed her symptoms progressively and gradually worsened.  She will wake up from her sleep with headaches and because her symptoms began to worsen she began to experience bouts of nausea to the point where her appetite was affected.  She denies any rhinorrhea, fevers or sick contacts and she denies any associated focal weakness, changes in vision or slurred speech.  Approximately 3 weeks ago patient spoke to a telemedicine provider who gave her a course of steroids with transient improvement but then her symptoms returned.  Because her symptoms continue to worsen she  presented to the med center [facility] for emergency department for further evaluation.  Upon evaluation in the emergency department she had a noncontrast head CT scan which revealed a 6 cm left frontal mass with moderate amount of surrounding brain edema as well as associated 1.4 cm midline shift.  CT imaging of the chest abdomen pelvis was additionally performed which revealed no evidence of malignant process.  She further underwent work-up and had an MRI done solitary large 6 to 5 mm diameter mass with lobulated margins it was centered at the left anterior cranial fossa that appeared to be both hypervascular extra-axial with although no dural tail identified there was associated hyper ostosis along the roof of the left orbit with a large meningioma being favored but a high-grade glioma and solitary metastasis being considerably less likely.  There is also regional mass-effect and relatively mild vasogenic edema primarily affecting the left anterior frontal lobe.  The case was discussed with normal surgery who recommended scheduled Decadron every 6 hours and she was transferred to [facility] for formal [facility] consultation  Discharge Diagnoses:  Principal Problem:   Frontal mass of brain Active Problems:   Hypothyroidism   Chronic headaches  Frontal mass of the brain, suspect meningioma -She presented with several month history of progressive headaches and nausea -CT imaging revealing a 6 cm frontal mass associated with brain edema and a 1.4 cm midline shift -She is initiated on IV Decadron 6 hours scheduled and changed  -Continue with neurochecks per protocol -MRI done suggestive of very large meningioma with redemonstration of edema and midline shift.  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 Kathyrn Sheriff who will see the patient today but was instead seen by Dr. Marcello Moores -Dr. Marcello Moores ordered CT DBS Head w/o Contrast and showed "Large left  anterior cranial fossa mass lesion measuring up to 6.5 cm, most likely a meningioma. Significant mass effect on the brain parenchyma and ventricular system with a 16 mm rightward midline shift. No hydrocephalus. 2. Mass effect on the bilateral ACA vascular tree, particularly A2 and A3 segments, and mild mass effect on the left M1-M2/MCA. 3. Left ACA frontopolar branch abuts the anterior aspect of the mass and appear to supply small feeders to the mass which has predominantly dural supply" -Case was discussed with Neurosurgery Dr. Marcello Moores who recommends discharging home today on dexamethasone 4 mg every 8 with anticipated plan for surgery 10/21/2020 given that the surgery will be fairly involved and lengthy given the size of the mass and vascularity -We have written her for 2 weeks of dexamethasone 4 mg every 8 and if neurosurgery wishes to extend this further she will need a prescription from them and recommend GI prophylaxis with a PPI -Continue with supportive care and treatment of her nausea and headaches with analgesics and antiemetics and have written her for po Zofran at D/C  Hypothyroidism -Check TSH as an outpatient and resume home regimen of Armour Thyroid  Leukocytosis -In the setting of Steroid Demargination with Decadron Administration -Patient's WBC went from 8.8 -> 14.5 -Continue to Monitor for S/Sx of Infection -Repeat CBC within 1 week  Hypophosphatemia -Patient's phosphorus level this morning was 2.0 -Replete with p.o. K-Phos Neutral 500 mg twice daily x2 doses -Continue monitor and trend and repeat phosphorus level in outpatient setting  Hyperglycemia -In the setting of Steroid Demargination -Patient is not Diabetic as HbA1c was 5.4 -Patient's Blood Sugar ranging from 83-145 -Continue to Monitor Blood Sugars carefully and follow up with PCP for further management if continues to remain consistently elevated  Discharge Instructions  Discharge Instructions    Call MD for:   difficulty breathing, headache or visual disturbances   Complete by: As directed    Call MD for:  extreme fatigue   Complete by: As directed    Call MD for:  hives   Complete by: As directed    Call MD for:  persistant dizziness or light-headedness   Complete by: As directed    Call MD for:  persistant nausea and vomiting   Complete by: As directed    Call MD for:  redness, tenderness, or signs of infection (pain, swelling, redness, odor or green/yellow discharge around incision site)   Complete by: As directed    Call MD for:  severe uncontrolled pain   Complete by: As directed    Call MD for:  temperature >100.4   Complete by: As directed    Diet - low sodium heart healthy   Complete by: As directed    Discharge instructions   Complete by: As directed    You were cared for by a hospitalist during your hospital stay. If you have any questions about your discharge medications or the care you received while you were in the hospital after you are discharged, you can call the unit and ask to speak with the hospitalist on call if the hospitalist that took care of you is not available. Once you are discharged, your primary care physician will handle any further medical issues. Please note that NO REFILLS for any discharge medications will be authorized once you  are discharged, as it is imperative that you return to your primary care physician (or establish a relationship with a primary care physician if you do not have one) for your aftercare needs so that they can reassess your need for medications and monitor your lab values.  Follow up with PCP and Neurosurgery within 1 week. Take all medications as prescribed. If symptoms change or worsen please return to the ED for evaluation   Increase activity slowly   Complete by: As directed      Allergies as of 10/15/2020      Reactions   Adhesive [tape] Other (See Comments)   Blisters   Lortab [hydrocodone-acetaminophen] Itching   Morphine And  Related Other (See Comments)   Spasms   Penicillins Hives   Has patient had a PCN reaction causing immediate rash, facial/tongue/throat swelling, SOB or lightheadedness with hypotension: No Has patient had a PCN reaction causing severe rash involving mucus membranes or skin necrosis: No Has patient had a PCN reaction that required hospitalization: No Has patient had a PCN reaction occurring within the last 10 years: No If all of the above answers are "NO", then may proceed with Cephalosporin use.   Percocet [oxycodone-acetaminophen] Itching      Medication List    STOP taking these medications   methylPREDNISolone 4 MG Tbpk tablet Commonly known as: MEDROL DOSEPAK   predniSONE 20 MG tablet Commonly known as: DELTASONE     TAKE these medications   aspirin-acetaminophen-caffeine [id number] MG tablet Commonly known as: EXCEDRIN MIGRAINE Take 2 tablets by mouth every 6 (six) hours as needed for headache.   azelastine 0.05 % ophthalmic solution Commonly known as: OPTIVAR Apply 1 drop to eye 2 (two) times daily.   azelastine 0.1 % nasal spray Commonly known as: ASTELIN Place 1 spray into both nostrils 2 (two) times daily.   BERBERINE COMPLEX PO Take 1 tablet by mouth daily.   dexamethasone 4 MG tablet Commonly known as: DECADRON Take 1 tablet (4 mg total) by mouth every 8 (eight) hours for 14 days.   Dexilant 60 MG capsule Generic drug: dexlansoprazole Take 60 mg by mouth daily.   estradiol 0.075 MG/24HR Commonly known as: VIVELLE-DOT Place 1 patch onto the skin 2 (two) times a week.   First-Testosterone MC 2 % Crea Place onto the skin.   Magnesium 400 MG Tabs Take 400 mg by mouth daily.   ondansetron 4 MG tablet Commonly known as: ZOFRAN Take 1 tablet (4 mg total) by mouth every 6 (six) hours as needed for nausea.   polyethylene glycol 17 g packet Commonly known as: MIRALAX / GLYCOLAX Take 17 g by mouth daily.   PRESCRIPTION MEDICATION Apply 0.25 g topically  See admin instructions. Testosterone 2% Cream Apply behind alternating knees as directed.   PROBIOTIC DAILY PO Take 1 tablet by mouth daily.   progesterone 100 MG capsule Commonly known as: PROMETRIUM Take 500 mg by mouth daily.   thyroid 32.5 MG tablet Commonly known as: ARMOUR Take 32.5 mg by mouth daily.   Armour Thyroid 30 MG tablet Generic drug: thyroid Take 30 mg by mouth every morning.   TURMERIC PO Take 1 tablet by mouth daily.   VITAMIN D PO Take by mouth.       Allergies  Allergen Reactions  . Adhesive [Tape] Other (See Comments)    Blisters  . Lortab [Hydrocodone-Acetaminophen] Itching  . Morphine And Related Other (See Comments)    Spasms  . Penicillins Hives    Has  patient had a PCN reaction causing immediate rash, facial/tongue/throat swelling, SOB or lightheadedness with hypotension: No Has patient had a PCN reaction causing severe rash involving mucus membranes or skin necrosis: No Has patient had a PCN reaction that required hospitalization: No Has patient had a PCN reaction occurring within the last 10 years: No If all of the above answers are "NO", then may proceed with Cephalosporin use.   Marland Kitchen Percocet [Oxycodone-Acetaminophen] Itching   Consultations:  Neurosurgery  Procedures/Studies: CT ANGIO HEAD W OR WO CONTRAST  Result Date: 10/14/2020 CLINICAL DATA:  Brain/CNS neoplasm.  Preop planning. EXAM: CT HEAD CT ANGIOGRAPHY HEAD TECHNIQUE: Multidetector CT imaging of the head was performed using the standard protocol without contrast administration. Angiographic images were obtained during bolus administration of intravenous contrast. Multiplanar CT image reconstructions and MIPs were obtained to evaluate the vascular anatomy. Delayed postcontrast images were also obtained. CONTRAST:  36mL OMNIPAQUE IOHEXOL 350 MG/ML SOLN COMPARISON:  MRI of the brain Oct 14, 2020; CT of the head Oct 13, 2020. FINDINGS: CT HEAD Brain: Large lobulated avidly enhancing  left frontal mass lesion is again seen in anterior aspect of the left anterior cranial fossa measuring approximately 6.5 x 6.2 x 5 cm with surrounding vasogenic edema and mass effect resulting approximately 16 mm rightward midline shift. There is mass effect on the frontal horns of the lateral ventricles with near effacement of the third ventricle without evidence of hydrocephalus. Effacement of the cerebral sulci is also seen. Mild hyperostosis the floor of the left anterior cranial fossa. For further details, please refer to recent MRI of the brain. No acute infarct, hemorrhage or extra-axial collection. Vascular: No unexpected finding. Skull: Mild focal hyperostosis in the floor of the left anterior cranial fossa. No fracture. Sinuses: Small mucous retention cyst in the left maxillary sinus. Other: None CTA HEAD Anterior circulation: No significant stenosis, proximal occlusion, aneurysm, or vascular malformation. Significant mass effect with rightward deviation of the bilateral ACA vascular tree, particularly the A2 and A3 segments. A left ACA frontopolar branch abuts the anterior aspect of the mass and and supply small feeders to the mass, although most of the intralesional arteries appear meningeal in origin. Mild posterior deviation of the left MCA vascular tree is also seen. Posterior circulation: No significant stenosis, proximal occlusion, aneurysm, or vascular malformation. Venous sinuses: Poorly opacified due to contrast bolus timing. Anatomic variants: None. Review of the MIP images confirms the above findings. IMPRESSION: 1. Large left anterior cranial fossa mass lesion measuring up to 6.5 cm, most likely a meningioma. Significant mass effect on the brain parenchyma and ventricular system with a 16 mm rightward midline shift. No hydrocephalus. 2. Mass effect on the bilateral ACA vascular tree, particularly A2 and A3 segments, and mild mass effect on the left M1-M2/MCA. 3. Left ACA frontopolar branch  abuts the anterior aspect of the mass and appear to supply small feeders to the mass which has predominantly dural supply. Electronically Signed   By: Pedro Earls M.D.   On: 10/14/2020 13:19   CT Head Wo Contrast  Result Date: 10/13/2020 CLINICAL DATA:  Headache. EXAM: CT HEAD WITHOUT CONTRAST TECHNIQUE: Contiguous axial images were obtained from the base of the skull through the vertex without intravenous contrast. COMPARISON:  None. FINDINGS: Brain: There is an approximately 6 cm mass in the anterior left frontal region of mixed attenuation. There is a small amount of associated hyperostosis along the floor of the anterior cranial fossa, however it is unclear whether the  mass is extra-axial or intra-axial. There is moderate surrounding vasogenic edema with regional mass effect including sulcal effacement and 1.4 cm of rightward midline shift. The frontal horns of the lateral ventricles are effaced. The suprasellar cistern is also effaced. No acute cortically based infarct, acute intracranial hemorrhage, or sizable extra-axial fluid collection is identified. Vascular: No hyperdense vessel. Skull: No fracture or suspicious osseous lesion. Sinuses/Orbits: Visualized paranasal sinuses and mastoid air cells are clear. Unremarkable orbits. Other: None. IMPRESSION: 6 cm anterior left frontal mass with moderate edema and 1.4 cm of rightward midline shift. Brain MRI without and with contrast is recommended for further evaluation. These results were called by telephone at the time of interpretation on 10/13/2020 at 5:55 p.m. to provider Etta Quill, who verbally acknowledged these results. Electronically Signed   By: Logan Bores M.D.   On: 10/13/2020 18:03   CT Chest W Contrast  Result Date: 10/13/2020 CLINICAL DATA:  Newly diagnosed brain neoplasm. Evaluate for possible primary malignancy. EXAM: CT CHEST, ABDOMEN, AND PELVIS WITH CONTRAST TECHNIQUE: Multidetector CT imaging of the chest, abdomen  and pelvis was performed following the standard protocol during bolus administration of intravenous contrast. CONTRAST:  73mL OMNIPAQUE IOHEXOL 300 MG/ML  SOLN COMPARISON:  Noncontrast AP CT on 12/06/2017 FINDINGS: CT CHEST FINDINGS Cardiovascular: No acute findings. Mediastinum/Lymph Nodes: No masses or pathologically enlarged lymph nodes identified. Lungs/Pleura: No suspicious pulmonary nodules or masses identified. No evidence of infiltrate or pleural effusion. Musculoskeletal: Bilateral breast implants noted. No suspicious bone lesions identified. CT ABDOMEN AND PELVIS FINDINGS Hepatobiliary: No masses identified. Prior cholecystectomy. Chronic biliary ductal dilatation is unchanged. Pancreas:  No mass or inflammatory changes. Spleen:  Within normal limits in size and appearance. Adrenals/Urinary tract: No masses identified. A few tiny 1-2 mm renal calculi seen bilaterally, however there is no evidence of ureteral calculi or hydronephrosis. Unremarkable unopacified urinary bladder. Stomach/Bowel: Tiny hiatal hernia noted. No evidence of obstruction, inflammatory process, or abnormal fluid collections. Vascular/Lymphatic: No pathologically enlarged lymph nodes identified. No acute vascular findings. Reproductive:  No mass or other significant abnormality identified. Other:  None. Musculoskeletal:  No suspicious bone lesions identified. IMPRESSION: No evidence of malignancy or other acute findings within the chest, abdomen, or pelvis. Tiny hiatal hernia. Tiny bilateral renal calculi. No evidence of ureteral calculi or hydronephrosis. Electronically Signed   By: Marlaine Hind M.D.   On: 10/13/2020 20:35   MR Brain W and Wo Contrast  Result Date: 10/14/2020 CLINICAL DATA:  50 year old female with headache and large anterior brain mass on noncontrast head CT yesterday. EXAM: MRI HEAD WITHOUT AND WITH CONTRAST TECHNIQUE: Multiplanar, multiecho pulse sequences of the brain and surrounding structures were obtained  without and with intravenous contrast. CONTRAST:  6mL GADAVIST GADOBUTROL 1 MMOL/ML IV SOLN COMPARISON:  Head CT 10/13/2020. FINDINGS: Brain: Large, round mass with lobulated margins is T2 and FLAIR hyperintense with vivid enhancement mass centered at the left anterior cranial fossa encompasses 65 x 59 x 59 mm (AP by transverse by CC) and appears to be extra-axial on the coronal T2 imaging (series 17, image 24). The mass is generally facilitated on diffusion, and has a central area of signal heterogeneity associated with a spoke wheel pattern of internal vascularity (series 10 image 12, image 15, series 18, image 31. But there is no dural tail or broad area of dural thickening superimposed on the mass. Mild hemosiderin or mineralization within the mass. Mass effect on the brain is associated with up to 16 mm of midline shift at  the anterior interhemispheric fissure. There is posterior mass effect on the frontal horns. There is subtotal effacement of the suprasellar cistern. And there is cytotoxic edema in the anterior frontal lobes, primarily the left which is relatively mild given the large lesion size. No other abnormal intracranial enhancement or cerebral edema. No superimposed restricted diffusion to suggest acute infarction. No ventriculomegaly, acute intracranial hemorrhage. Cervicomedullary junction and pituitary remain within normal limits. Vascular: Major intracranial vascular flow voids are preserved. No prominent flow void vascularity along the posterior margin of the anterior brain mass on series 10, image 15 and series 17, image 21. The major dural venous sinuses are enhancing and appear to be patent. Skull and upper cervical spine: Mild hyperostosis over the left orbital roof is associated with the mass as demonstrated by CT. Negative visible cervical spine and spinal cord. Visualized bone marrow signal is within normal limits. Sinuses/Orbits: Downward mass effect on the optic chiasm. Intraorbital soft  tissues are normal. Mild left maxillary sinus mucosal thickening. Other: Mastoids are clear. Visible internal auditory structures appear normal. IMPRESSION: 1. Solitary large 65 mm diameter mass with lobulated margins is centered at the left anterior cranial fossa and appears to be both hypervascular and extra-axial. Although no dural tail is identified there is mild associated hyperostosis along the roof of the left orbit. 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 High-grade glioma and solitary metastasis are considerably less likely. 2. Regional mass effect. Relatively mild vasogenic edema primarily affecting the left anterior frontal lobe. Electronically Signed   By: Genevie Ann M.D.   On: 10/14/2020 06:50   CT ABDOMEN PELVIS W CONTRAST  Result Date: 10/13/2020 CLINICAL DATA:  Newly diagnosed brain neoplasm. Evaluate for possible primary malignancy. EXAM: CT CHEST, ABDOMEN, AND PELVIS WITH CONTRAST TECHNIQUE: Multidetector CT imaging of the chest, abdomen and pelvis was performed following the standard protocol during bolus administration of intravenous contrast. CONTRAST:  38mL OMNIPAQUE IOHEXOL 300 MG/ML  SOLN COMPARISON:  Noncontrast AP CT on 12/06/2017 FINDINGS: CT CHEST FINDINGS Cardiovascular: No acute findings. Mediastinum/Lymph Nodes: No masses or pathologically enlarged lymph nodes identified. Lungs/Pleura: No suspicious pulmonary nodules or masses identified. No evidence of infiltrate or pleural effusion. Musculoskeletal: Bilateral breast implants noted. No suspicious bone lesions identified. CT ABDOMEN AND PELVIS FINDINGS Hepatobiliary: No masses identified. Prior cholecystectomy. Chronic biliary ductal dilatation is unchanged. Pancreas:  No mass or inflammatory changes. Spleen:  Within normal limits in size and appearance. Adrenals/Urinary tract: No masses identified. A few tiny 1-2 mm renal calculi seen bilaterally, however there is no evidence of ureteral calculi or hydronephrosis. Unremarkable unopacified  urinary bladder. Stomach/Bowel: Tiny hiatal hernia noted. No evidence of obstruction, inflammatory process, or abnormal fluid collections. Vascular/Lymphatic: No pathologically enlarged lymph nodes identified. No acute vascular findings. Reproductive:  No mass or other significant abnormality identified. Other:  None. Musculoskeletal:  No suspicious bone lesions identified. IMPRESSION: No evidence of malignancy or other acute findings within the chest, abdomen, or pelvis. Tiny hiatal hernia. Tiny bilateral renal calculi. No evidence of ureteral calculi or hydronephrosis. Electronically Signed   By: Marlaine Hind M.D.   On: 10/13/2020 20:35   CT DBS HEAD W/O CONTRAST  Result Date: 10/14/2020 CLINICAL DATA:  Brain/CNS neoplasm.  Preop planning. EXAM: CT HEAD CT ANGIOGRAPHY HEAD TECHNIQUE: Multidetector CT imaging of the head was performed using the standard protocol without contrast administration. Angiographic images were obtained during bolus administration of intravenous contrast. Multiplanar CT image reconstructions and MIPs were obtained to evaluate the vascular anatomy. Delayed postcontrast images were also  obtained. CONTRAST:  60mL OMNIPAQUE IOHEXOL 350 MG/ML SOLN COMPARISON:  MRI of the brain Oct 14, 2020; CT of the head Oct 13, 2020. FINDINGS: CT HEAD Brain: Large lobulated avidly enhancing left frontal mass lesion is again seen in anterior aspect of the left anterior cranial fossa measuring approximately 6.5 x 6.2 x 5 cm with surrounding vasogenic edema and mass effect resulting approximately 16 mm rightward midline shift. There is mass effect on the frontal horns of the lateral ventricles with near effacement of the third ventricle without evidence of hydrocephalus. Effacement of the cerebral sulci is also seen. Mild hyperostosis the floor of the left anterior cranial fossa. For further details, please refer to recent MRI of the brain. No acute infarct, hemorrhage or extra-axial collection. Vascular: No  unexpected finding. Skull: Mild focal hyperostosis in the floor of the left anterior cranial fossa. No fracture. Sinuses: Small mucous retention cyst in the left maxillary sinus. Other: None CTA HEAD Anterior circulation: No significant stenosis, proximal occlusion, aneurysm, or vascular malformation. Significant mass effect with rightward deviation of the bilateral ACA vascular tree, particularly the A2 and A3 segments. A left ACA frontopolar branch abuts the anterior aspect of the mass and and supply small feeders to the mass, although most of the intralesional arteries appear meningeal in origin. Mild posterior deviation of the left MCA vascular tree is also seen. Posterior circulation: No significant stenosis, proximal occlusion, aneurysm, or vascular malformation. Venous sinuses: Poorly opacified due to contrast bolus timing. Anatomic variants: None. Review of the MIP images confirms the above findings. IMPRESSION: 1. Large left anterior cranial fossa mass lesion measuring up to 6.5 cm, most likely a meningioma. Significant mass effect on the brain parenchyma and ventricular system with a 16 mm rightward midline shift. No hydrocephalus. 2. Mass effect on the bilateral ACA vascular tree, particularly A2 and A3 segments, and mild mass effect on the left M1-M2/MCA. 3. Left ACA frontopolar branch abuts the anterior aspect of the mass and appear to supply small feeders to the mass which has predominantly dural supply. Electronically Signed   By: Pedro Earls M.D.   On: 10/14/2020 13:19    Subjective: Seen and examined at bedside and she had some pressure on her left ear.  No nausea or vomiting this morning but feels intermittently nauseous.  Does not have a headache today but states it comes and goes.  Had some mild pain on palpation of her lower extremities.  She feels fairly well and a anxious about her upcoming surgical procedure.  All questions were answered to the patient's satisfaction and  patient's daughter satisfaction and she has been deemed medically stable to be discharged and she understands the plan of care and will follow up with neurosurgery on 10/21/2020 for surgical intervention.  Discharge Exam: Vitals:   10/15/20 0805 10/15/20 0810  BP: 121/84 125/89  Pulse: 78 76  Resp:    Temp:    SpO2:     Vitals:   10/15/20 0714 10/15/20 0800 10/15/20 0805 10/15/20 0810  BP: 120/65 123/72 121/84 125/89  Pulse: 76 79 78 76  Resp: 18     Temp: 98.5 F (36.9 C)     TempSrc: Oral     SpO2: 99%     Weight:      Height:       General: Pt is alert, awake, not in acute distress Cardiovascular: RRR, S1/S2 +, no rubs, no gallops Respiratory: Diminished bilaterally, no wheezing, no rhonchi; unlabored breathing Abdominal: Soft,  NT, mildly distended seen by habitus, bowel sounds + Extremities: no edema, no cyanosis; mild pain on palpation of the lower extremities  The results of significant diagnostics from this hospitalization (including imaging, microbiology, ancillary and laboratory) are listed below for reference.    Microbiology: Recent Results (from the past 240 hour(s))  SARS CORONAVIRUS 2 (TAT 6-24 HRS) Nasopharyngeal Nasopharyngeal Swab     Status: None   Collection Time: 10/13/20  7:32 PM   Specimen: Nasopharyngeal Swab  Result Value Ref Range Status   SARS Coronavirus 2 NEGATIVE NEGATIVE Final    Comment: (NOTE) SARS-CoV-2 target nucleic acids are NOT DETECTED.  The SARS-CoV-2 RNA is generally detectable in upper and lower respiratory specimens during the acute phase of infection. Negative results do not preclude SARS-CoV-2 infection, do not rule out co-infections with other pathogens, and should not be used as the sole basis for treatment or other patient management decisions. Negative results must be combined with clinical observations, patient history, and epidemiological information. The expected result is Negative.  Fact Sheet for  Patients: SugarRoll.be  Fact Sheet for Healthcare Providers: https://www.woods-mathews.com/  This test is not yet approved or cleared by the [place] FDA and  has been authorized for detection and/or diagnosis of SARS-CoV-2 by FDA under an Emergency Use Authorization (EUA). This EUA will remain  in effect (meaning this test can be used) for the duration of the COVID-19 declaration under Se ction 564(b)(1) of the Act, 21 U.S.C. section 360bbb-3(b)(1), unless the authorization is terminated or revoked sooner.  Performed at [facility], [place] [address] [place], [place] [address]      Labs: BNP (last 3 results) No results for input(s): BNP in the last 8760 hours. Basic Metabolic Panel: Recent Labs  Lab 10/13/20 1358 10/14/20 0814 10/15/20 0317  NA 136 138 137  K 3.7 4.0 4.1  CL 104 107 106  CO2 22 22 23   GLUCOSE 83 117* 145*  BUN 21* 15 16  CREATININE 0.69 0.85 0.83  CALCIUM 9.0 9.8 9.3  MG  --  2.4 2.3  PHOS  --  3.7 2.0*   Liver Function Tests: Recent Labs  Lab 10/13/20 1358 10/14/20 0814 10/15/20 0317  AST 18 19 13*  ALT 15 18 14   ALKPHOS 56 49 45  BILITOT 0.3 0.9 0.6  PROT 7.1 7.8 6.3*  ALBUMIN 4.0 4.2 3.5   Recent Labs  Lab 10/13/20 1358  LIPASE 41   No results for input(s): AMMONIA in the last 168 hours. CBC: Recent Labs  Lab 10/13/20 1358 10/15/20 0317  WBC 8.8 14.5*  NEUTROABS 6.1 13.0*  HGB 14.2 12.9  HCT 41.9 38.1  MCV 88.0 87.6  PLT 312 321   Cardiac Enzymes: No results for input(s): CKTOTAL, CKMB, CKMBINDEX, TROPONINI in the last 168 hours. BNP: Invalid input(s): POCBNP CBG: No results for input(s): GLUCAP in the last 168 hours. D-Dimer No results for input(s): DDIMER in the last 72 hours. Hgb A1c Recent Labs    10/14/20 0814  HGBA1C 5.4   Lipid Profile No results for input(s): CHOL, HDL, LDLCALC, TRIG, CHOLHDL, LDLDIRECT in the last 72 hours. Thyroid function studies No  results for input(s): TSH, T4TOTAL, T3FREE, THYROIDAB in the last 72 hours.  Invalid input(s): FREET3 Anemia work up No results for input(s): VITAMINB12, FOLATE, FERRITIN, TIBC, IRON, RETICCTPCT in the last 72 hours. Urinalysis    Component Value Date/Time   COLORURINE YELLOW 12/08/2017 1515   APPEARANCEUR CLEAR 12/08/2017 1515   LABSPEC 1.012  12/08/2017 1515   PHURINE 8.0 12/08/2017 1515   GLUCOSEU NEGATIVE 12/08/2017 1515   HGBUR NEGATIVE 12/08/2017 [place] 12/08/2017 1515   BILIRUBINUR - 03/12/2013 [place] 12/08/2017 1515   PROTEINUR NEGATIVE 12/08/2017 1515   UROBILINOGEN negative 03/12/2013 1558   NITRITE NEGATIVE 12/08/2017 1515   LEUKOCYTESUR NEGATIVE 12/08/2017 1515   Sepsis Labs Invalid input(s): PROCALCITONIN,  WBC,  LACTICIDVEN Microbiology Recent Results (from the past 240 hour(s))  SARS CORONAVIRUS 2 (TAT 6-24 HRS) Nasopharyngeal Nasopharyngeal Swab     Status: None   Collection Time: 10/13/20  7:32 PM   Specimen: Nasopharyngeal Swab  Result Value Ref Range Status   SARS Coronavirus 2 NEGATIVE NEGATIVE Final    Comment: (NOTE) SARS-CoV-2 target nucleic acids are NOT DETECTED.  The SARS-CoV-2 RNA is generally detectable in upper and lower respiratory specimens during the acute phase of infection. Negative results do not preclude SARS-CoV-2 infection, do not rule out co-infections with other pathogens, and should not be used as the sole basis for treatment or other patient management decisions. Negative results must be combined with clinical observations, patient history, and epidemiological information. The expected result is Negative.  Fact Sheet for Patients: SugarRoll.be  Fact Sheet for Healthcare Providers: https://www.woods-mathews.com/  This test is not yet approved or cleared by the [place] FDA and  has been authorized for detection and/or diagnosis of SARS-CoV-2  by FDA under an Emergency Use Authorization (EUA). This EUA will remain  in effect (meaning this test can be used) for the duration of the COVID-19 declaration under Se ction 564(b)(1) of the Act, 21 U.S.C. section 360bbb-3(b)(1), unless the authorization is terminated or revoked sooner.  Performed at [facility], [place] [address], [place], [place] [address]    Time coordinating discharge: 35 minutes  SIGNED:  Kerney Elbe, DO [facility] 10/15/2020, 10:50 AM Pager is on [place]  If 7PM-7AM, please contact night-coverage [email or web address]

## 2020-10-15 NOTE — Plan of Care (Signed)

## 2020-10-15 NOTE — TOC Transition Note (Signed)
Transition of Care [facility]) - CM/SW Discharge Note   Patient Details  Name: Lydia Jenkins MRN: [id number] Date of Birth: 01/19/1971  [facility] CM/SW Contact:  Pollie Friar, RN Phone Number: 10/15/2020, 10:26 AM   Clinical Narrative:    Pt discharging home with self care. Pt to return on 5/26 for OR.  Pt has transportation home.   Final next level of care: Home/Self Care Barriers to Discharge: No Barriers Identified   Patient Goals and CMS Choice        Discharge Placement                       Discharge Plan and Services                                     Social Determinants of Health (SDOH) Interventions     Readmission Risk Interventions No flowsheet data found.

## 2020-10-15 NOTE — Progress Notes (Signed)
I had a long discussion with the patient and family yesterday.  Given the size of the mass and vascularity, surgery will be fairly involved and lengthy.  Will plan for surgery 5/26.  She will be discharged on dexamethasone 4 mg q8 hrs.  They verbalized understanding and agreement with the plan.

## 2020-10-19 ENCOUNTER — Other Ambulatory Visit: Payer: Self-pay | Admitting: Neurosurgery

## 2020-10-20 ENCOUNTER — Encounter (HOSPITAL_COMMUNITY): Payer: Self-pay | Admitting: *Deleted

## 2020-10-20 ENCOUNTER — Other Ambulatory Visit (HOSPITAL_COMMUNITY)
Admission: RE | Admit: 2020-10-20 | Discharge: 2020-10-20 | Disposition: A | Discharge status: Home / Self Care | Payer: 59 | Source: Ambulatory Visit | Attending: Neurosurgery | Admitting: Neurosurgery

## 2020-10-20 DIAGNOSIS — Z01812 Encounter for preprocedural laboratory examination: Secondary | ICD-10-CM | POA: Insufficient documentation

## 2020-10-20 DIAGNOSIS — Z20822 Contact with and (suspected) exposure to covid-19: Secondary | ICD-10-CM | POA: Insufficient documentation

## 2020-10-20 LAB — SARS CORONAVIRUS 2 (TAT 6-24 HRS): SARS Coronavirus 2: NEGATIVE

## 2020-10-20 NOTE — Anesthesia Preprocedure Evaluation (Addendum)
Anesthesia Evaluation  Patient identified by MRN, date of birth, ID band Patient awake    Reviewed: Allergy & Precautions, H&P , NPO status , Patient's Chart, lab work & pertinent test results  History of Anesthesia Complications (+) PONV and Family history of anesthesia reaction  Airway Mallampati: II  TM Distance: >3 FB Neck ROM: Full    Dental no notable dental hx. (+) Teeth Intact, Dental Advisory Given   Pulmonary neg pulmonary ROS,    Pulmonary exam normal breath sounds clear to auscultation       Cardiovascular Exercise Tolerance: Good negative cardio ROS   Rhythm:Regular Rate:Normal     Neuro/Psych  Headaches, Anxiety Depression    GI/Hepatic Neg liver ROS, hiatal hernia,   Endo/Other  Hypothyroidism   Renal/GU negative Renal ROS  negative genitourinary   Musculoskeletal   Abdominal   Peds  Hematology  (+) Blood dyscrasia, anemia ,   Anesthesia Other Findings   Reproductive/Obstetrics negative OB ROS                            Anesthesia Physical Anesthesia Plan  ASA: III  Anesthesia Plan: General   Post-op Pain Management:    Induction: Intravenous  PONV Risk Score and Plan: 4 or greater and Ondansetron, Aprepitant, Midazolam, Scopolamine patch - Pre-op and Propofol infusion  Airway Management Planned: Oral ETT  Additional Equipment: Arterial line and CVP  Intra-op Plan:   Post-operative Plan: Extubation in OR  Informed Consent: I have reviewed the patients History and Physical, chart, labs and discussed the procedure including the risks, benefits and alternatives for the proposed anesthesia with the patient or authorized representative who has indicated his/her understanding and acceptance.     Dental advisory given  Plan Discussed with: CRNA  Anesthesia Plan Comments: (PAT note written 10/20/2020 by Myra Gianotti, [place]   Anesthesia  team will need to clarify anesthesia family history on day of surgery, as PAT staff unable to reach patient as of 5:00 PM 10/20/20 to clarify this history. )    Anesthesia Quick Evaluation

## 2020-10-20 NOTE — Progress Notes (Signed)
Anesthesia Chart Review: SAME DAY WORK-UP   Case: [id number] Date/Time: 10/21/20 0713   Procedures:      LEFT FRONTAL CRANIOTOMY FOR RESECTION OF MENINGIOMA (Left )     APPLICATION OF CRANIAL NAVIGATION (N/A )   Anesthesia type: General   Pre-op diagnosis: Meningioma   Location: [facility] OR ROOM 21 / [place] OR   Surgeons: Vallarie Mare, MD      DISCUSSION: Patient is a 50 year old female scheduled for the above procedure.  History includes never smoker, post-operative N/V, anxiety, depression, hypothyroidism, iron deficiency anemia, migraines, hiatal hernia. She had elevated LFTs in July 2019 in the setting of ductal stricture, s/p MRCP with biliary sphincterotomy and common bile duct plastic stent placement 12/10/17 by Carol Ada, MD.   In her history lists "Family History of Anesthetic Complications". There are no specific details (but no documentation of malignant hyperthermia noted), and PAT RN so far has been unable to reach patient by phone. Anesthesia team to evaluate on the day of surgery.  [place] Admission 10/13/20-10/15/20 for worsening headaches x 3 months, now with nausea. CT head at [place] showed 6 cm left fontal mass. Transferred to [facility] for Neurosurgery evaluation and further work-up. MRI brain and CTA of the head done. Large meningioma with mass effect favored. Discharged on steroids and Neurosurgery follow-up. Now with plans for above surgery 10/21/20.    Preoperative COVID-19 test is in process. She has some labs from 10/15/20, but would also need T&S given type of procedure.      VS: 10/15/20 BP 125/89, HR 76  PROVIDERS: Tamala Julian, MD is PCP    LABS: Labs as of 10/15/20 include: Lab Results  Component Value Date   WBC 14.5 (H) 10/15/2020   HGB 12.9 10/15/2020   HCT 38.1 10/15/2020   PLT 321 10/15/2020   GLUCOSE 145 (H) 10/15/2020   ALT 14 10/15/2020   AST 13 (L) 10/15/2020   NA 137 10/15/2020   K 4.1 10/15/2020   CL 106 10/15/2020   CREATININE 0.83  10/15/2020   BUN 16 10/15/2020   CO2 23 10/15/2020   INR 1.0 10/14/2020   HGBA1C 5.4 10/14/2020     IMAGES: CTA Head 10/14/20: IMPRESSION: 1. Large left anterior cranial fossa mass lesion measuring up to 6.5 cm, most likely a meningioma. Significant mass effect on the brain parenchyma and ventricular system with a 16 mm rightward midline shift. No hydrocephalus. 2. Mass effect on the bilateral ACA vascular tree, particularly A2 and A3 segments, and mild mass effect on the left M1-M2/MCA. 3. Left ACA frontopolar branch abuts the anterior aspect of the mass and appear to supply small feeders to the mass which has predominantly dural supply.   MRI Brain 10/14/20: IMPRESSION: 1. Solitary large 65 mm diameter mass with lobulated margins is centered at the left anterior cranial fossa and appears to be both hypervascular and extra-axial. Although no dural tail is identified there is mild associated hyperostosis along the roof of the left orbit. 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 High-grade glioma and solitary metastasis are considerably less likely. 2. Regional mass effect. Relatively mild vasogenic edema primarily affecting the left anterior frontal lobe.   CT Chest/abd/pelvis 10/13/20: IMPRESSION: - No evidence of malignancy or other acute findings within the chest, abdomen, or pelvis. - Tiny hiatal hernia. - Tiny bilateral renal calculi. No evidence of ureteral calculi or hydronephrosis.   EKG: 10/13/20: Sinus rhythm with short PR (PR 106 ms) Otherwise normal ECG When compared  to prior, similar appearance. No STEMI Confirmed by Antony Blackbird [phone]) on 10/13/2020 5:05:02 PM   CV: N/A  Past Medical History:  Diagnosis Date  . Abnormal Pap smear   . Anemia    iron deficient  . Anxiety   . Breast disorder    precencerous lesion, marker placed  . Complication of anesthesia   . Depression   . Family history of adverse reaction to anesthesia   . Hiatal hernia   .  Liver dysfunction   . Menorrhagia   . Migraine headache with aura   . PONV (postoperative nausea and vomiting)     Past Surgical History:  Procedure Laterality Date  . ABDOMINOPLASTY  2010  . AUGMENTATION MAMMAPLASTY Bilateral 2009  . BILIARY STENT PLACEMENT N/A 12/10/2017   Procedure: BILIARY STENT PLACEMENT;  Surgeon: Carol Ada, MD;  Location: [facility] ENDOSCOPY;  Service: Endoscopy;  Laterality: N/A;  plastic 8.5 x 5 stent placed  . BREAST BIOPSY Left 2010   precancerous bx  . CRYOTHERAPY     cervix-3x  . ENDOMETRIAL ABLATION  2010  . ERCP N/A 12/10/2017   Procedure: ENDOSCOPIC RETROGRADE CHOLANGIOPANCREATOGRAPHY (ERCP);  Surgeon: Carol Ada, MD;  Location: [facility];  Service: Endoscopy;  Laterality: N/A;  Balloon sweep of duct  . LAPAROSCOPIC CHOLECYSTECTOMY  2008  . SPHINCTEROTOMY  12/10/2017   Procedure: SPHINCTEROTOMY;  Surgeon: Carol Ada, MD;  Location: [facility];  Service: Endoscopy;;  . TUBAL LIGATION  1998    MEDICATIONS: No current facility-administered medications for this encounter.   Francia Greaves THYROID 30 MG tablet  . ASHWAGANDHA PO  . aspirin-acetaminophen-caffeine (EXCEDRIN MIGRAINE) 250-250-65 MG tablet  . azelastine (ASTELIN) 0.1 % nasal spray  . azelastine (OPTIVAR) 0.05 % ophthalmic solution  . Barberry-Oreg Grape-Goldenseal (BERBERINE COMPLEX PO)  . cholecalciferol (VITAMIN D) 25 MCG (1000 UNIT) tablet  . Cyanocobalamin (VITAMIN B-12) 5000 MCG SUBL  . dexamethasone (DECADRON) 4 MG tablet  . Digestive Enzymes CAPS  . estradiol (VIVELLE-DOT) 0.075 MG/24HR  . Magnesium 400 MG TABS  . ondansetron (ZOFRAN) 4 MG tablet  . Probiotic Product (PROBIOTIC DAILY PO)  . progesterone (PROMETRIUM) 100 MG capsule  . progesterone (PROMETRIUM) 200 MG capsule  . Testosterone Propionate (FIRST-TESTOSTERONE [facility]) 2 % CREA  . Turmeric 500 MG CAPS     Myra Gianotti, PA-C Surgical Short Stay/Anesthesiology [facility] Phone [phone] [facility] Phone [phone] 10/20/2020 6:03 PM

## 2020-10-20 NOTE — Progress Notes (Signed)
Unable to reach patient via phone.  Left detailed message on machine with instructions for DOS.  PCP -   Cardiologist - n/a  CT Chest x-ray - 10/13/20 EKG - 10/13/20 Stress Test - n/a ECHO - n/a Cardiac Cath - n/a  Sleep Study -  n/a CPAP - n/a  Anesthesia review: Yes  STOP now taking any Aspirin (unless otherwise instructed by your surgeon), Aleve, Naproxen, Ibuprofen, Motrin, Advil, Goody's, BC's, all herbal medications, fish oil, and all vitamins.   Coronavirus Screening: Covid test on 10/20/20 is pending results.Marland Kitchen

## 2020-10-21 ENCOUNTER — Inpatient Hospital Stay (HOSPITAL_COMMUNITY)
Admission: RE | Admit: 2020-10-21 | Discharge: 2020-10-26 | DRG: 025 | Disposition: A | Discharge status: Home / Self Care | Payer: 59 | Attending: Neurosurgery | Admitting: Neurosurgery

## 2020-10-21 ENCOUNTER — Inpatient Hospital Stay (HOSPITAL_COMMUNITY): Payer: 59 | Admitting: Physician Assistant

## 2020-10-21 ENCOUNTER — Encounter (HOSPITAL_COMMUNITY): Payer: Self-pay

## 2020-10-21 ENCOUNTER — Inpatient Hospital Stay (HOSPITAL_COMMUNITY): Payer: 59

## 2020-10-21 ENCOUNTER — Inpatient Hospital Stay (HOSPITAL_COMMUNITY)
Admission: RE | Disposition: A | Discharge status: Home / Self Care | Payer: Self-pay | Source: Home / Self Care | Attending: Neurosurgery

## 2020-10-21 ENCOUNTER — Other Ambulatory Visit: Payer: Self-pay

## 2020-10-21 DIAGNOSIS — Z8249 Family history of ischemic heart disease and other diseases of the circulatory system: Secondary | ICD-10-CM | POA: Diagnosis not present

## 2020-10-21 DIAGNOSIS — R531 Weakness: Secondary | ICD-10-CM

## 2020-10-21 DIAGNOSIS — Z888 Allergy status to other drugs, medicaments and biological substances status: Secondary | ICD-10-CM | POA: Diagnosis not present

## 2020-10-21 DIAGNOSIS — Z880 Allergy status to penicillin: Secondary | ICD-10-CM | POA: Diagnosis not present

## 2020-10-21 DIAGNOSIS — G9389 Other specified disorders of brain: Secondary | ICD-10-CM

## 2020-10-21 DIAGNOSIS — E039 Hypothyroidism, unspecified: Secondary | ICD-10-CM | POA: Diagnosis present

## 2020-10-21 DIAGNOSIS — Z885 Allergy status to narcotic agent status: Secondary | ICD-10-CM

## 2020-10-21 DIAGNOSIS — Z79899 Other long term (current) drug therapy: Secondary | ICD-10-CM | POA: Diagnosis not present

## 2020-10-21 DIAGNOSIS — F419 Anxiety disorder, unspecified: Secondary | ICD-10-CM | POA: Diagnosis not present

## 2020-10-21 DIAGNOSIS — Z832 Family history of diseases of the blood and blood-forming organs and certain disorders involving the immune mechanism: Secondary | ICD-10-CM | POA: Diagnosis not present

## 2020-10-21 DIAGNOSIS — Z9889 Other specified postprocedural states: Secondary | ICD-10-CM

## 2020-10-21 DIAGNOSIS — Z7982 Long term (current) use of aspirin: Secondary | ICD-10-CM | POA: Diagnosis not present

## 2020-10-21 DIAGNOSIS — D320 Benign neoplasm of cerebral meninges: Principal | ICD-10-CM | POA: Diagnosis present

## 2020-10-21 DIAGNOSIS — Z7952 Long term (current) use of systemic steroids: Secondary | ICD-10-CM

## 2020-10-21 DIAGNOSIS — Z452 Encounter for adjustment and management of vascular access device: Secondary | ICD-10-CM

## 2020-10-21 DIAGNOSIS — Z86018 Personal history of other benign neoplasm: Secondary | ICD-10-CM

## 2020-10-21 DIAGNOSIS — G935 Compression of brain: Secondary | ICD-10-CM | POA: Diagnosis present

## 2020-10-21 DIAGNOSIS — Z20822 Contact with and (suspected) exposure to covid-19: Secondary | ICD-10-CM | POA: Diagnosis present

## 2020-10-21 DIAGNOSIS — Z833 Family history of diabetes mellitus: Secondary | ICD-10-CM

## 2020-10-21 DIAGNOSIS — G939 Disorder of brain, unspecified: Secondary | ICD-10-CM | POA: Diagnosis present

## 2020-10-21 HISTORY — DX: Hypothyroidism, unspecified: E03.9

## 2020-10-21 HISTORY — PX: BRAIN TUMOR EXCISION: SHX577

## 2020-10-21 HISTORY — PX: CRANIOTOMY: SHX93

## 2020-10-21 HISTORY — PX: APPLICATION OF CRANIAL NAVIGATION: SHX6578

## 2020-10-21 LAB — POCT I-STAT 7, (LYTES, BLD GAS, ICA,H+H)
Acid-base deficit: 3 mmol/L — ABNORMAL HIGH (ref 0.0–2.0)
Acid-base deficit: 2 mmol/L (ref 0.0–2.0)
Acid-base deficit: 3 mmol/L — ABNORMAL HIGH (ref 0.0–2.0)
Acid-base deficit: 4 mmol/L — ABNORMAL HIGH (ref 0.0–2.0)
Bicarbonate: 22.9 mmol/L (ref 20.0–28.0)
Bicarbonate: 22.1 mmol/L (ref 20.0–28.0)
Bicarbonate: 21.7 mmol/L (ref 20.0–28.0)
Bicarbonate: 20.5 mmol/L (ref 20.0–28.0)
Calcium, Ion: 1.15 mmol/L (ref 1.15–1.40)
Calcium, Ion: 1.12 mmol/L — ABNORMAL LOW (ref 1.15–1.40)
Calcium, Ion: 1.13 mmol/L — ABNORMAL LOW (ref 1.15–1.40)
Calcium, Ion: 1.01 mmol/L — ABNORMAL LOW (ref 1.15–1.40)
HCT: 34 % — ABNORMAL LOW (ref 36.0–46.0)
HCT: 28 % — ABNORMAL LOW (ref 36.0–46.0)
HCT: 30 % — ABNORMAL LOW (ref 36.0–46.0)
HCT: 31 % — ABNORMAL LOW (ref 36.0–46.0)
Hemoglobin: 11.6 g/dL — ABNORMAL LOW (ref 12.0–15.0)
Hemoglobin: 9.5 g/dL — ABNORMAL LOW (ref 12.0–15.0)
Hemoglobin: 10.2 g/dL — ABNORMAL LOW (ref 12.0–15.0)
Hemoglobin: 10.5 g/dL — ABNORMAL LOW (ref 12.0–15.0)
O2 Saturation: 100 %
O2 Saturation: 100 %
O2 Saturation: 100 %
O2 Saturation: 100 %
Patient temperature: 34.5
Patient temperature: 35
Patient temperature: 35.6
Patient temperature: 35.9
Potassium: 3.8 mmol/L (ref 3.5–5.1)
Potassium: 4 mmol/L (ref 3.5–5.1)
Potassium: 4.2 mmol/L (ref 3.5–5.1)
Potassium: 4.3 mmol/L (ref 3.5–5.1)
Sodium: 138 mmol/L (ref 135–145)
Sodium: 141 mmol/L (ref 135–145)
Sodium: 143 mmol/L (ref 135–145)
Sodium: 145 mmol/L (ref 135–145)
TCO2: 24 mmol/L (ref 22–32)
TCO2: 23 mmol/L (ref 22–32)
TCO2: 23 mmol/L (ref 22–32)
TCO2: 22 mmol/L (ref 22–32)
pCO2 arterial: 38.3 mmHg (ref 32.0–48.0)
pCO2 arterial: 31.3 mmHg — ABNORMAL LOW (ref 32.0–48.0)
pCO2 arterial: 34.1 mmHg (ref 32.0–48.0)
pCO2 arterial: 33.9 mmHg (ref 32.0–48.0)
pH, Arterial: 7.374 (ref 7.350–7.450)
pH, Arterial: 7.449 (ref 7.350–7.450)
pH, Arterial: 7.405 (ref 7.350–7.450)
pH, Arterial: 7.385 (ref 7.350–7.450)
pO2, Arterial: 274 mmHg — ABNORMAL HIGH (ref 83.0–108.0)
pO2, Arterial: 302 mmHg — ABNORMAL HIGH (ref 83.0–108.0)
pO2, Arterial: 288 mmHg — ABNORMAL HIGH (ref 83.0–108.0)
pO2, Arterial: 291 mmHg — ABNORMAL HIGH (ref 83.0–108.0)

## 2020-10-21 LAB — MRSA PCR SCREENING: MRSA by PCR: NEGATIVE

## 2020-10-21 LAB — COMPREHENSIVE METABOLIC PANEL
ALT: 12 U/L (ref 0–44)
AST: 16 U/L (ref 15–41)
Albumin: 3.6 g/dL (ref 3.5–5.0)
Alkaline Phosphatase: 47 U/L (ref 38–126)
Anion gap: 9 (ref 5–15)
BUN: 22 mg/dL — ABNORMAL HIGH (ref 6–20)
CO2: 25 mmol/L (ref 22–32)
Calcium: 9.1 mg/dL (ref 8.9–10.3)
Chloride: 103 mmol/L (ref 98–111)
Creatinine, Ser: 0.85 mg/dL (ref 0.44–1.00)
GFR, Estimated: >60 mL/min (ref >60)
Glucose, Bld: 107 mg/dL — ABNORMAL HIGH (ref 70–99)
Potassium: 3.9 mmol/L (ref 3.5–5.1)
Sodium: 137 mmol/L (ref 135–145)
Total Bilirubin: 0.9 mg/dL (ref 0.3–1.2)
Total Protein: 6.3 g/dL — ABNORMAL LOW (ref 6.5–8.1)

## 2020-10-21 LAB — PREPARE RBC (CROSSMATCH)

## 2020-10-21 LAB — POCT PREGNANCY, URINE: Preg Test, Ur: NEGATIVE

## 2020-10-21 LAB — ABO/RH: ABO/RH(D): B NEG

## 2020-10-21 SURGERY — CRANIOTOMY TUMOR EXCISION
Anesthesia: General

## 2020-10-21 MED ORDER — CHLORHEXIDINE GLUCONATE 0.12 % MT SOLN
15.0000 mL | Freq: Once | OROMUCOSAL | Status: AC
  Administered 2020-10-21: 15 mL via OROMUCOSAL
  Filled 2020-10-21: qty 15

## 2020-10-21 MED ORDER — MANNITOL 25 % IV SOLN
INTRAVENOUS | Status: DC | PRN
  Administered 2020-10-21: 10 g via INTRAVENOUS
  Administered 2020-10-21 (×2): 12.5 g via INTRAVENOUS

## 2020-10-21 MED ORDER — ROCURONIUM BROMIDE 10 MG/ML (PF) SYRINGE
PREFILLED_SYRINGE | INTRAVENOUS | Status: AC
  Filled 2020-10-21: qty 10

## 2020-10-21 MED ORDER — AMISULPRIDE (ANTIEMETIC) 5 MG/2ML IV SOLN
10.0000 mg | Freq: Once | INTRAVENOUS | Status: AC
  Administered 2020-10-21: 10 mg via INTRAVENOUS

## 2020-10-21 MED ORDER — PHENYLEPHRINE HCL-NACL 10-0.9 MG/250ML-% IV SOLN
INTRAVENOUS | Status: DC | PRN
  Administered 2020-10-21: 25 ug/min via INTRAVENOUS
  Administered 2020-10-21: 85 ug/min via INTRAVENOUS
  Administered 2020-10-21: 60 ug/min via INTRAVENOUS

## 2020-10-21 MED ORDER — SODIUM CHLORIDE 0.9 % IV SOLN: INTRAVENOUS | Status: DC

## 2020-10-21 MED ORDER — THROMBIN 5000 UNITS EX SOLR
CUTANEOUS | Status: AC
  Filled 2020-10-21: qty 10000

## 2020-10-21 MED ORDER — LIDOCAINE 2% (20 MG/ML) 5 ML SYRINGE
INTRAMUSCULAR | Status: AC
  Filled 2020-10-21: qty 5

## 2020-10-21 MED ORDER — THROMBIN 5000 UNITS EX SOLR
CUTANEOUS | Status: AC
  Filled 2020-10-21: qty 5000

## 2020-10-21 MED ORDER — FIRST-TESTOSTERONE MC 2 % TD CREA: 1.0000 "application " | TOPICAL_CREAM | Freq: Every day | TRANSDERMAL | Status: DC

## 2020-10-21 MED ORDER — FENTANYL CITRATE (PF) 250 MCG/5ML IJ SOLN
INTRAMUSCULAR | Status: AC
  Filled 2020-10-21: qty 5

## 2020-10-21 MED ORDER — ROCURONIUM BROMIDE 10 MG/ML (PF) SYRINGE
PREFILLED_SYRINGE | INTRAVENOUS | Status: DC | PRN
  Administered 2020-10-21: 20 mg via INTRAVENOUS
  Administered 2020-10-21: 10 mg via INTRAVENOUS
  Administered 2020-10-21: 40 mg via INTRAVENOUS
  Administered 2020-10-21: 5 mg via INTRAVENOUS
  Administered 2020-10-21: 30 mg via INTRAVENOUS
  Administered 2020-10-21: 60 mg via INTRAVENOUS
  Administered 2020-10-21: 50 mg via INTRAVENOUS

## 2020-10-21 MED ORDER — ORAL CARE MOUTH RINSE
15.0000 mL | Freq: Two times a day (BID) | OROMUCOSAL | Status: DC
  Administered 2020-10-21 – 2020-10-26 (×5): 15 mL via OROMUCOSAL

## 2020-10-21 MED ORDER — CHLORHEXIDINE GLUCONATE CLOTH 2 % EX PADS: 6.0000 | MEDICATED_PAD | Freq: Once | CUTANEOUS | Status: DC

## 2020-10-21 MED ORDER — AZELASTINE HCL 0.1 % NA SOLN: 1.0000 | Freq: Every day | NASAL | Status: DC | PRN

## 2020-10-21 MED ORDER — LACTATED RINGERS IV SOLN: INTRAVENOUS | Status: DC | PRN

## 2020-10-21 MED ORDER — DEXAMETHASONE SODIUM PHOSPHATE 10 MG/ML IJ SOLN
INTRAMUSCULAR | Status: DC | PRN
  Administered 2020-10-21: 6 mg via INTRAVENOUS

## 2020-10-21 MED ORDER — SCOPOLAMINE 1 MG/3DAYS TD PT72
MEDICATED_PATCH | TRANSDERMAL | Status: DC | PRN
  Administered 2020-10-21: 1 via TRANSDERMAL

## 2020-10-21 MED ORDER — MAGNESIUM OXIDE -MG SUPPLEMENT 400 (240 MG) MG PO TABS
1200.0000 mg | ORAL_TABLET | Freq: Every day | ORAL | Status: DC
  Administered 2020-10-21 – 2020-10-25 (×5): 1200 mg via ORAL
  Filled 2020-10-21 (×5): qty 3

## 2020-10-21 MED ORDER — SODIUM CHLORIDE 0.9 % IV SOLN
0.0125 ug/kg/min | INTRAVENOUS | Status: AC
  Administered 2020-10-21: .15 ug/kg/min via INTRAVENOUS
  Filled 2020-10-21: qty 1000

## 2020-10-21 MED ORDER — PROPOFOL 10 MG/ML IV BOLUS
INTRAVENOUS | Status: DC | PRN
  Administered 2020-10-21: 40 mg via INTRAVENOUS
  Administered 2020-10-21: 30 mg via INTRAVENOUS
  Administered 2020-10-21: 100 mg via INTRAVENOUS
  Administered 2020-10-21: 30 mg via INTRAVENOUS

## 2020-10-21 MED ORDER — PROGESTERONE MICRONIZED 100 MG PO CAPS
500.0000 mg | ORAL_CAPSULE | Freq: Every day | ORAL | Status: DC
  Administered 2020-10-21 – 2020-10-25 (×5): 500 mg via ORAL
  Filled 2020-10-21 (×6): qty 5

## 2020-10-21 MED ORDER — ONDANSETRON HCL 4 MG/2ML IJ SOLN
INTRAMUSCULAR | Status: AC
  Filled 2020-10-21: qty 2

## 2020-10-21 MED ORDER — LABETALOL HCL 5 MG/ML IV SOLN: 10.0000 mg | INTRAVENOUS | Status: DC | PRN

## 2020-10-21 MED ORDER — ACETAMINOPHEN 650 MG RE SUPP: 650.0000 mg | RECTAL | Status: DC | PRN

## 2020-10-21 MED ORDER — VITAMIN B-12 1000 MCG PO TABS
5000.0000 ug | ORAL_TABLET | Freq: Every day | ORAL | Status: DC
  Administered 2020-10-22 – 2020-10-26 (×5): 5000 ug via ORAL
  Filled 2020-10-21 (×5): qty 5

## 2020-10-21 MED ORDER — AMISULPRIDE (ANTIEMETIC) 5 MG/2ML IV SOLN
INTRAVENOUS | Status: AC
  Filled 2020-10-21: qty 4

## 2020-10-21 MED ORDER — ACETAMINOPHEN 325 MG PO TABS
650.0000 mg | ORAL_TABLET | ORAL | Status: DC | PRN
  Administered 2020-10-21 – 2020-10-26 (×15): 650 mg via ORAL
  Filled 2020-10-21 (×15): qty 2

## 2020-10-21 MED ORDER — SODIUM CHLORIDE 0.9 % IV SOLN: INTRAVENOUS | Status: DC | PRN

## 2020-10-21 MED ORDER — PANTOPRAZOLE SODIUM 40 MG PO TBEC
40.0000 mg | DELAYED_RELEASE_TABLET | Freq: Every day | ORAL | Status: DC
  Administered 2020-10-22 – 2020-10-26 (×5): 40 mg via ORAL
  Filled 2020-10-21 (×5): qty 1

## 2020-10-21 MED ORDER — ACETAMINOPHEN 10 MG/ML IV SOLN
INTRAVENOUS | Status: AC
  Filled 2020-10-21: qty 100

## 2020-10-21 MED ORDER — PROPOFOL 500 MG/50ML IV EMUL
INTRAVENOUS | Status: DC | PRN
  Administered 2020-10-21: 20 ug/kg/min via INTRAVENOUS

## 2020-10-21 MED ORDER — THROMBIN 5000 UNITS EX SOLR
OROMUCOSAL | Status: DC | PRN
  Administered 2020-10-21 (×4): 5 mL via TOPICAL

## 2020-10-21 MED ORDER — PROPOFOL 10 MG/ML IV BOLUS
INTRAVENOUS | Status: AC
  Filled 2020-10-21: qty 40

## 2020-10-21 MED ORDER — LIDOCAINE-EPINEPHRINE 1 %-1:100000 IJ SOLN
INTRAMUSCULAR | Status: DC | PRN
  Administered 2020-10-21: 7 mL

## 2020-10-21 MED ORDER — SODIUM CHLORIDE 0.9% IV SOLUTION: Freq: Once | INTRAVENOUS | Status: DC

## 2020-10-21 MED ORDER — MIDAZOLAM HCL 5 MG/5ML IJ SOLN
INTRAMUSCULAR | Status: DC | PRN
  Administered 2020-10-21 (×2): 1 mg via INTRAVENOUS

## 2020-10-21 MED ORDER — KETOTIFEN FUMARATE 0.025 % OP SOLN: 1.0000 [drp] | Freq: Every day | OPHTHALMIC | Status: DC | PRN

## 2020-10-21 MED ORDER — PROGESTERONE 200 MG PO CAPS: 400.0000 mg | ORAL_CAPSULE | ORAL | Status: DC

## 2020-10-21 MED ORDER — ONDANSETRON HCL 4 MG/2ML IJ SOLN
INTRAMUSCULAR | Status: DC | PRN
  Administered 2020-10-21: 4 mg via INTRAVENOUS

## 2020-10-21 MED ORDER — MIDAZOLAM HCL 2 MG/2ML IJ SOLN
INTRAMUSCULAR | Status: AC
  Filled 2020-10-21: qty 2

## 2020-10-21 MED ORDER — SODIUM CHLORIDE 0.9 % IV SOLN: 0.0500 ug/kg/min | INTRAVENOUS | Status: DC

## 2020-10-21 MED ORDER — SUGAMMADEX SODIUM 200 MG/2ML IV SOLN
INTRAVENOUS | Status: DC | PRN
  Administered 2020-10-21: 200 mg via INTRAVENOUS

## 2020-10-21 MED ORDER — LEVETIRACETAM 500 MG PO TABS
500.0000 mg | ORAL_TABLET | Freq: Two times a day (BID) | ORAL | Status: DC
  Administered 2020-10-21 – 2020-10-23 (×5): 500 mg via ORAL
  Filled 2020-10-21 (×5): qty 1

## 2020-10-21 MED ORDER — ONDANSETRON HCL 4 MG PO TABS
4.0000 mg | ORAL_TABLET | ORAL | Status: DC | PRN
  Administered 2020-10-25 – 2020-10-26 (×2): 4 mg via ORAL
  Filled 2020-10-21 (×2): qty 1

## 2020-10-21 MED ORDER — VITAMIN B-12 5000 MCG SL SUBL: 5000.0000 ug | SUBLINGUAL_TABLET | Freq: Every day | SUBLINGUAL | Status: DC

## 2020-10-21 MED ORDER — FLEET ENEMA 7-19 GM/118ML RE ENEM: 1.0000 | ENEMA | Freq: Once | RECTAL | Status: DC | PRN

## 2020-10-21 MED ORDER — FENTANYL CITRATE (PF) 100 MCG/2ML IJ SOLN
25.0000 ug | INTRAMUSCULAR | Status: DC | PRN
  Administered 2020-10-21 – 2020-10-26 (×23): 25 ug via INTRAVENOUS
  Filled 2020-10-21 (×26): qty 2

## 2020-10-21 MED ORDER — VANCOMYCIN HCL IN DEXTROSE 1-5 GM/200ML-% IV SOLN
1000.0000 mg | INTRAVENOUS | Status: AC
  Administered 2020-10-21: 1000 mg via INTRAVENOUS
  Filled 2020-10-21: qty 200

## 2020-10-21 MED ORDER — ALBUMIN HUMAN 5 % IV SOLN: INTRAVENOUS | Status: DC | PRN

## 2020-10-21 MED ORDER — LIDOCAINE 2% (20 MG/ML) 5 ML SYRINGE
INTRAMUSCULAR | Status: DC | PRN
  Administered 2020-10-21: 60 mg via INTRAVENOUS

## 2020-10-21 MED ORDER — ACETAMINOPHEN 10 MG/ML IV SOLN
INTRAVENOUS | Status: DC | PRN
  Administered 2020-10-21: 1000 mg via INTRAVENOUS

## 2020-10-21 MED ORDER — FENTANYL CITRATE (PF) 100 MCG/2ML IJ SOLN
25.0000 ug | INTRAMUSCULAR | Status: DC | PRN
  Administered 2020-10-21 (×3): 50 ug via INTRAVENOUS

## 2020-10-21 MED ORDER — THROMBIN 20000 UNITS EX SOLR
CUTANEOUS | Status: DC | PRN
  Administered 2020-10-21: 20 mL via TOPICAL

## 2020-10-21 MED ORDER — DEXAMETHASONE SODIUM PHOSPHATE 10 MG/ML IJ SOLN
INTRAMUSCULAR | Status: AC
  Filled 2020-10-21: qty 1

## 2020-10-21 MED ORDER — SODIUM CHLORIDE 0.9 % IV SOLN
0.0500 ug/kg/min | INTRAVENOUS | Status: AC
  Administered 2020-10-21: .2 ug/kg/min via INTRAVENOUS
  Filled 2020-10-21: qty 5000

## 2020-10-21 MED ORDER — THROMBIN 20000 UNITS EX SOLR
CUTANEOUS | Status: AC
  Filled 2020-10-21: qty 20000

## 2020-10-21 MED ORDER — BACITRACIN ZINC 500 UNIT/GM EX OINT
TOPICAL_OINTMENT | CUTANEOUS | Status: AC
  Filled 2020-10-21: qty 28.35

## 2020-10-21 MED ORDER — ORAL CARE MOUTH RINSE: 15.0000 mL | Freq: Once | OROMUCOSAL | Status: AC

## 2020-10-21 MED ORDER — VITAMIN D 25 MCG (1000 UNIT) PO TABS
1000.0000 [IU] | ORAL_TABLET | Freq: Every day | ORAL | Status: DC
  Administered 2020-10-22 – 2020-10-26 (×5): 1000 [IU] via ORAL
  Filled 2020-10-21 (×6): qty 1

## 2020-10-21 MED ORDER — PROPOFOL 1000 MG/100ML IV EMUL
INTRAVENOUS | Status: AC
  Filled 2020-10-21: qty 100

## 2020-10-21 MED ORDER — DOCUSATE SODIUM 100 MG PO CAPS
100.0000 mg | ORAL_CAPSULE | Freq: Two times a day (BID) | ORAL | Status: DC
  Administered 2020-10-22 – 2020-10-26 (×9): 100 mg via ORAL
  Filled 2020-10-21 (×9): qty 1

## 2020-10-21 MED ORDER — VANCOMYCIN HCL 750 MG/150ML IV SOLN
750.0000 mg | Freq: Two times a day (BID) | INTRAVENOUS | Status: AC
  Administered 2020-10-21 – 2020-10-22 (×2): 750 mg via INTRAVENOUS
  Filled 2020-10-21 (×2): qty 150

## 2020-10-21 MED ORDER — PROMETHAZINE HCL 25 MG PO TABS: 12.5000 mg | ORAL_TABLET | ORAL | Status: DC | PRN

## 2020-10-21 MED ORDER — APREPITANT 40 MG PO CAPS
40.0000 mg | ORAL_CAPSULE | Freq: Once | ORAL | Status: AC
  Administered 2020-10-21: 40 mg via ORAL
  Filled 2020-10-21: qty 1

## 2020-10-21 MED ORDER — ENALAPRILAT 1.25 MG/ML IV SOLN
1.2500 mg | Freq: Four times a day (QID) | INTRAVENOUS | Status: DC | PRN
  Filled 2020-10-21: qty 1

## 2020-10-21 MED ORDER — HEMOSTATIC AGENTS (NO CHARGE) OPTIME
TOPICAL | Status: DC | PRN
  Administered 2020-10-21 (×2): 1 via TOPICAL

## 2020-10-21 MED ORDER — CHLORHEXIDINE GLUCONATE CLOTH 2 % EX PADS
6.0000 | MEDICATED_PAD | Freq: Every day | CUTANEOUS | Status: DC
  Administered 2020-10-21 – 2020-10-26 (×5): 6 via TOPICAL

## 2020-10-21 MED ORDER — MAGNESIUM 400 MG PO TABS: 1200.0000 mg | ORAL_TABLET | Freq: Every day | ORAL | Status: DC

## 2020-10-21 MED ORDER — 0.9 % SODIUM CHLORIDE (POUR BTL) OPTIME
TOPICAL | Status: DC | PRN
  Administered 2020-10-21: 1000 mL
  Administered 2020-10-21: 2000 mL
  Administered 2020-10-21: 1000 mL

## 2020-10-21 MED ORDER — HEPARIN SODIUM (PORCINE) 5000 UNIT/ML IJ SOLN: 5000.0000 [IU] | Freq: Two times a day (BID) | INTRAMUSCULAR | Status: DC

## 2020-10-21 MED ORDER — POTASSIUM CHLORIDE IN NACL 20-0.9 MEQ/L-% IV SOLN
INTRAVENOUS | Status: DC
  Filled 2020-10-21 (×4): qty 1000

## 2020-10-21 MED ORDER — FENTANYL CITRATE (PF) 100 MCG/2ML IJ SOLN
INTRAMUSCULAR | Status: AC
  Administered 2020-10-22: 25 ug via INTRAVENOUS
  Filled 2020-10-21: qty 2

## 2020-10-21 MED ORDER — ONDANSETRON HCL 4 MG/2ML IJ SOLN
4.0000 mg | INTRAMUSCULAR | Status: DC | PRN
  Administered 2020-10-21 – 2020-10-26 (×13): 4 mg via INTRAVENOUS
  Filled 2020-10-21 (×13): qty 2

## 2020-10-21 MED ORDER — BACITRACIN ZINC 500 UNIT/GM EX OINT
TOPICAL_OINTMENT | CUTANEOUS | Status: DC | PRN
  Administered 2020-10-21 (×2): 1 via TOPICAL

## 2020-10-21 MED ORDER — POLYETHYLENE GLYCOL 3350 17 G PO PACK: 17.0000 g | PACK | Freq: Every day | ORAL | Status: DC | PRN

## 2020-10-21 MED ORDER — NICARDIPINE HCL IN NACL 20-0.86 MG/200ML-% IV SOLN: 3.0000 mg/h | INTRAVENOUS | Status: DC

## 2020-10-21 MED ORDER — LIDOCAINE-EPINEPHRINE 1 %-1:100000 IJ SOLN
INTRAMUSCULAR | Status: AC
  Filled 2020-10-21: qty 1

## 2020-10-21 SURGICAL SUPPLY — 100 items
BAND RUBBER #18 3X1/16 STRL (MISCELLANEOUS) ×9 IMPLANT
BENZOIN TINCTURE PRP APPL 2/3 (GAUZE/BANDAGES/DRESSINGS) IMPLANT
BLADE CLIPPER SURG (BLADE) ×3 IMPLANT
BLADE SAW GIGLI 16 STRL (MISCELLANEOUS) IMPLANT
BLADE SURG 11 STRL SS (BLADE) ×3 IMPLANT
BLADE SURG 15 STRL LF DISP TIS (BLADE) IMPLANT
BLADE SURG 15 STRL SS (BLADE)
BNDG GAUZE ELAST 4 BULKY (GAUZE/BANDAGES/DRESSINGS) ×6 IMPLANT
BNDG STRETCH 4X75 STRL LF (GAUZE/BANDAGES/DRESSINGS) ×9 IMPLANT
BUR ACORN 9.0 PRECISION (BURR) ×3 IMPLANT
BUR ROUND FLUTED 4 SOFT TCH (BURR) IMPLANT
BUR SABER DIAMOND 5.0 (BURR) ×3 IMPLANT
BUR SPIRAL ROUTER 2.3 (BUR) ×3 IMPLANT
CANISTER SUCT 3000ML PPV (MISCELLANEOUS) ×6 IMPLANT
CATH VENTRIC 35X38 W/TROCAR LG (CATHETERS) IMPLANT
CLIP VESOCCLUDE MED 6/CT (CLIP) IMPLANT
CNTNR URN SCR LID CUP LEK RST (MISCELLANEOUS) ×6 IMPLANT
CONT SPEC 4OZ STRL OR WHT (MISCELLANEOUS) ×3
COVER BURR HOLE 7 (Orthopedic Implant) ×3 IMPLANT
COVER BURR HOLE UNIV 10 (Orthopedic Implant) ×9 IMPLANT
COVER MAYO STAND STRL (DRAPES) IMPLANT
COVER WAND RF STERILE (DRAPES) IMPLANT
DECANTER SPIKE VIAL GLASS SM (MISCELLANEOUS) ×3 IMPLANT
DRAIN SUBARACHNOID (WOUND CARE) IMPLANT
DRAPE HALF SHEET 40X57 (DRAPES) ×3 IMPLANT
DRAPE MICROSCOPE LEICA (MISCELLANEOUS) ×3 IMPLANT
DRAPE NEUROLOGICAL W/INCISE (DRAPES) ×3 IMPLANT
DRAPE STERI IOBAN 125X83 (DRAPES) IMPLANT
DRAPE SURG 17X23 STRL (DRAPES) IMPLANT
DRAPE WARM FLUID 44X44 (DRAPES) ×3 IMPLANT
DRSG ADAPTIC 3X8 NADH LF (GAUZE/BANDAGES/DRESSINGS) IMPLANT
DRSG AQUACEL AG ADV 3.5X 6 (GAUZE/BANDAGES/DRESSINGS) IMPLANT
DRSG TELFA 3X8 NADH (GAUZE/BANDAGES/DRESSINGS) IMPLANT
DURAPREP 6ML APPLICATOR 50/CS (WOUND CARE) ×3 IMPLANT
ELECT COATED BLADE 2.86 ST (ELECTRODE) ×3 IMPLANT
ELECT REM PT RETURN 9FT ADLT (ELECTROSURGICAL) ×3
ELECTRODE REM PT RTRN 9FT ADLT (ELECTROSURGICAL) ×2 IMPLANT
EVACUATOR 1/8 PVC DRAIN (DRAIN) ×3 IMPLANT
EVACUATOR SILICONE 100CC (DRAIN) IMPLANT
FORCEPS BIPO MALIS IRRIG 9X1.5 (NEUROSURGERY SUPPLIES) ×3 IMPLANT
GAUZE 4X4 16PLY RFD (DISPOSABLE) ×3 IMPLANT
GAUZE SPONGE 4X4 12PLY STRL (GAUZE/BANDAGES/DRESSINGS) ×3 IMPLANT
GAUZE SPONGE 4X4 12PLY STRL LF (GAUZE/BANDAGES/DRESSINGS) ×3 IMPLANT
GAUZE XEROFORM 1X8 LF (GAUZE/BANDAGES/DRESSINGS) ×3 IMPLANT
GLOVE BIO SURGEON STRL SZ7.5 (GLOVE) ×3 IMPLANT
GLOVE BIOGEL PI IND STRL 7.5 (GLOVE) ×2 IMPLANT
GLOVE BIOGEL PI INDICATOR 7.5 (GLOVE) ×1
GLOVE ECLIPSE 7.5 STRL STRAW (GLOVE) IMPLANT
GLOVE EXAM NITRILE LRG STRL (GLOVE) IMPLANT
GLOVE EXAM NITRILE XL STR (GLOVE) IMPLANT
GOWN STRL REUS W/ TWL LRG LVL3 (GOWN DISPOSABLE) ×4 IMPLANT
GOWN STRL REUS W/ TWL XL LVL3 (GOWN DISPOSABLE) IMPLANT
GOWN STRL REUS W/TWL 2XL LVL3 (GOWN DISPOSABLE) IMPLANT
GOWN STRL REUS W/TWL LRG LVL3 (GOWN DISPOSABLE) ×2
GOWN STRL REUS W/TWL XL LVL3 (GOWN DISPOSABLE)
GRAFT DURAGEN MATRIX 3WX3L (Graft) ×1 IMPLANT
GRAFT DURAGEN MATRIX 3X3 SNGL (Graft) ×2 IMPLANT
HEMOSTAT POWDER KIT SURGIFOAM (HEMOSTASIS) ×12 IMPLANT
HEMOSTAT SURGICEL 2X14 (HEMOSTASIS) ×3 IMPLANT
HEMOSTAT SURGICEL 2X4 FIBR (HEMOSTASIS) IMPLANT
HOOK DURA 1/2IN (MISCELLANEOUS) ×3 IMPLANT
HOOK RETRACTION 12 ELAST STAY (MISCELLANEOUS) ×12 IMPLANT
IV NS 1000ML (IV SOLUTION) ×2
IV NS 1000ML BAXH (IV SOLUTION) ×4 IMPLANT
KIT BASIN OR (CUSTOM PROCEDURE TRAY) ×3 IMPLANT
KIT DRAIN CSF ACCUDRAIN (MISCELLANEOUS) IMPLANT
KIT TURNOVER KIT B (KITS) ×3 IMPLANT
MARKER SPHERE PSV REFLC 13MM (MARKER) ×6 IMPLANT
NEEDLE HYPO 22GX1.5 SAFETY (NEEDLE) ×3 IMPLANT
NEEDLE SPNL 18GX3.5 QUINCKE PK (NEEDLE) IMPLANT
NS IRRIG 1000ML POUR BTL (IV SOLUTION) ×12 IMPLANT
PACK CRANIOTOMY CUSTOM (CUSTOM PROCEDURE TRAY) ×3 IMPLANT
PATTIES SURGICAL .25X.25 (GAUZE/BANDAGES/DRESSINGS) IMPLANT
PATTIES SURGICAL .5 X.5 (GAUZE/BANDAGES/DRESSINGS) ×3 IMPLANT
PATTIES SURGICAL .5 X3 (DISPOSABLE) ×9 IMPLANT
PATTIES SURGICAL 1/4 X 3 (GAUZE/BANDAGES/DRESSINGS) IMPLANT
PATTIES SURGICAL 1X1 (DISPOSABLE) ×3 IMPLANT
PIN MAYFIELD SKULL DISP (PIN) ×3 IMPLANT
SCREW UNIII AXS SD 1.5X4 (Screw) ×48 IMPLANT
SEALANT ADHERUS EXTEND TIP (MISCELLANEOUS) ×3 IMPLANT
SEALER BIPOLAR AQUA 2.3 (INSTRUMENTS) ×3 IMPLANT
SET TUBING IRRIGATION DISP (TUBING) ×3 IMPLANT
SPONGE NEURO XRAY DETECT 1X3 (DISPOSABLE) ×3 IMPLANT
SPONGE SURGIFOAM ABS GEL 100 (HEMOSTASIS) ×3 IMPLANT
STAPLER VISISTAT 35W (STAPLE) ×6 IMPLANT
STOCKINETTE 6  STRL (DRAPES) ×1
STOCKINETTE 6 STRL (DRAPES) ×2 IMPLANT
SUT ETHILON 3 0 FSL (SUTURE) IMPLANT
SUT ETHILON 3 0 PS 1 (SUTURE) IMPLANT
SUT MNCRL AB 3-0 PS2 18 (SUTURE) ×3 IMPLANT
SUT NURALON 4 0 TR CR/8 (SUTURE) ×6 IMPLANT
SUT SILK 0 TIES 10X30 (SUTURE) IMPLANT
SUT VIC AB 2-0 CP2 18 (SUTURE) ×9 IMPLANT
SUT VICRYL RAPIDE 4/0 PS 2 (SUTURE) ×6 IMPLANT
TOWEL GREEN STERILE (TOWEL DISPOSABLE) ×3 IMPLANT
TOWEL GREEN STERILE FF (TOWEL DISPOSABLE) ×3 IMPLANT
TRAY FOLEY MTR SLVR 16FR STAT (SET/KITS/TRAYS/PACK) ×3 IMPLANT
TUBE CONNECTING 12X1/4 (SUCTIONS) ×3 IMPLANT
UNDERPAD 30X36 HEAVY ABSORB (UNDERPADS AND DIAPERS) ×3 IMPLANT
WATER STERILE IRR 1000ML POUR (IV SOLUTION) ×3 IMPLANT

## 2020-10-21 NOTE — H&P (Signed)
CC: headache  HPI:     Patient is a 50 y.o. female with Sphincter of Odi dysfunction who presented to the emergency room for headache.  She was found to have a large 6.5 cm left frontal extra-axial mass consistent with meningioma with significant mass-effect.  Metastatic work-up was negative.  She has had some difficulties with coordination that she noticed when playing tennis, and severe nightly headaches that wake her up but has not noticed any speech difficulties or weakness.  She is right-handed.       Patient Active Problem List   Diagnosis Date Noted  . Chronic headaches 10/14/2020  . Frontal mass of brain 10/13/2020  . Elevated LFTs   . Dilated cbd, acquired   . Abdominal pain 12/06/2017  . Hypothyroidism 12/06/2017  . Night sweats 03/11/2015  . S/P endometrial ablation 03/12/2013  . Premenstrual syndrome 03/12/2013  . Anemia 08/14/2012  . Pelvic pain in female 08/13/2012  . Menorrhagia        Past Medical History:  Diagnosis Date  . Abnormal Pap smear   . Anemia    iron deficient  . Anxiety   . Breast disorder    precencerous lesion, marker placed  . Complication of anesthesia   . Depression   . Family history of adverse reaction to anesthesia   . Hiatal hernia   . Liver dysfunction   . Menorrhagia   . Migraine headache with aura   . PONV (postoperative nausea and vomiting)          Past Surgical History:  Procedure Laterality Date  . ABDOMINOPLASTY  2010  . AUGMENTATION MAMMAPLASTY Bilateral 2009  . BILIARY STENT PLACEMENT N/A 12/10/2017   Procedure: BILIARY STENT PLACEMENT;  Surgeon: Carol Ada, MD;  Location: [facility];  Service: Endoscopy;  Laterality: N/A;  plastic 8.5 x 5 stent placed  . BREAST BIOPSY Left 2010   precancerous bx  . CRYOTHERAPY     cervix-3x  . ENDOMETRIAL ABLATION  2010  . ERCP N/A 12/10/2017   Procedure: ENDOSCOPIC RETROGRADE CHOLANGIOPANCREATOGRAPHY (ERCP);  Surgeon: Carol Ada, MD;   Location: [facility];  Service: Endoscopy;  Laterality: N/A;  Balloon sweep of duct  . LAPAROSCOPIC CHOLECYSTECTOMY  2008  . SPHINCTEROTOMY  12/10/2017   Procedure: SPHINCTEROTOMY;  Surgeon: Carol Ada, MD;  Location: [facility];  Service: Endoscopy;;  . TUBAL LIGATION  1998           Medications Prior to Admission  Medication Sig Dispense Refill Last Dose  . aspirin-acetaminophen-caffeine (EXCEDRIN MIGRAINE) 250-250-65 MG tablet Take 2 tablets by mouth every 6 (six) hours as needed for headache.   10/13/2020 at Unknown time  . ARMOUR THYROID 30 MG tablet Take 30 mg by mouth every morning.     Marland Kitchen azelastine (ASTELIN) 0.1 % nasal spray Place 1 spray into both nostrils 2 (two) times daily.     Marland Kitchen azelastine (OPTIVAR) 0.05 % ophthalmic solution Apply 1 drop to eye 2 (two) times daily.     Jolyne Loa Grape-Goldenseal (BERBERINE COMPLEX PO) Take 1 tablet by mouth daily.     Marland Kitchen DEXILANT 60 MG capsule Take 60 mg by mouth daily.     Marland Kitchen estradiol (VIVELLE-DOT) 0.075 MG/24HR Place 1 patch onto the skin 2 (two) times a week.     . Magnesium 400 MG TABS Take 400 mg by mouth daily.      . methylPREDNISolone (MEDROL DOSEPAK) 4 MG TBPK tablet as directed. Per taper dose pack instructions     .  polyethylene glycol (MIRALAX / GLYCOLAX) packet Take 17 g by mouth daily.     . predniSONE (DELTASONE) 20 MG tablet Take 40 mg by mouth daily. For 5 days     . PRESCRIPTION MEDICATION Apply 0.25 g topically See admin instructions. Testosterone 2% Cream Apply behind alternating knees as directed.     . Probiotic Product (PROBIOTIC DAILY PO) Take 1 tablet by mouth daily.     . progesterone (PROMETRIUM) 100 MG capsule Take 500 mg by mouth daily.      . Testosterone Propionate (FIRST-TESTOSTERONE MC) 2 % CREA Place onto the skin.     Marland Kitchen thyroid (ARMOUR) 32.5 MG tablet Take 32.5 mg by mouth daily.     . TURMERIC PO Take 1 tablet by mouth daily.     Marland Kitchen VITAMIN D  PO Take by mouth.           Allergies  Allergen Reactions  . Adhesive [Tape] Other (See Comments)    Blisters  . Lortab [Hydrocodone-Acetaminophen] Itching  . Morphine And Related Other (See Comments)    Spasms  . Penicillins Hives    Has patient had a PCN reaction causing immediate rash, facial/tongue/throat swelling, SOB or lightheadedness with hypotension: No Has patient had a PCN reaction causing severe rash involving mucus membranes or skin necrosis: No Has patient had a PCN reaction that required hospitalization: No Has patient had a PCN reaction occurring within the last 10 years: No If all of the above answers are "NO", then may proceed with Cephalosporin use.   Marland Kitchen Percocet [Oxycodone-Acetaminophen] Itching    Social History       Tobacco Use  . Smoking status: Never Smoker  . Smokeless tobacco: Never Used  Substance Use Topics  . Alcohol use: Not Currently         Family History  Problem Relation Age of Onset  . Hypertension Mother   . Diabetes Mother 47       Type 2  . Anesthesia problems Mother        anesthetic complication at hysterectomy  . Hypertension Sister   . Diabetes Brother   . Clotting disorder Son      Review of Systems Pertinent items noted in HPI and remainder of comprehensive ROS otherwise negative.  Objective:   Patient Vitals for the past 8 hrs:  BP Temp Temp src Pulse Resp SpO2 Weight  10/14/20 0840 109/76 98.1 F (36.7 C) Oral 75 18 100 % --  10/14/20 0334 125/69 98.5 F (36.9 C) Oral 84 18 100 % 70 kg  10/14/20 0227 118/77 -- -- 86 18 96 % --   No intake/output data recorded. No intake/output data recorded.      General : Alert, cooperative, no distress, appears stated age   Head:  Normocephalic/atraumatic    Eyes: PERRL, conjunctiva/corneas clear, EOM's intact. Fundi could not be visualized Neck: Supple Chest:  Respirations unlabored Chest wall: no tenderness or deformity Heart: Regular rate  and rhythm Abdomen: Soft, nontender and nondistended Extremities: warm and well-perfused Skin: normal turgor, color and texture Neurologic:  Alert, oriented x 3.  Eyes open spontaneously. PERRL, EOMI, VFC, no facial droop. V1-3 intact.  No dysarthria, tongue protrusion symmetric.  CNII-XII intact. Normal strength, sensation and reflexes throughout.  No pronator drift, full strength in legs       Data Review MRI brain reviewed.  Large 6.5 cm left frontal meningioma involving the orbital roof.  The ACAs appear displaced medially and the carotid was displaced posteriorly.  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  Assessment:   Large 6.5 cm left frontal meningioma  Plan:  -I had a long discussion with the patient and her family.  Given the size of her meningioma, I recommend surgical resection.  Given the vascularity and the size, it likely would be a fairly lengthy surgery of at least 6 hours. Risks, benefits, alternatives, and expected convalescence were discussed with her and family.  I discussed in detail the procedure.  Possibility of craniectomy, ventricular or lumbar drain placement, and central line placement was also discussed. Risks, benefits, alternatives, and expected convalescence was discussed.  Risks discussed included but were not limited to bleeding, pain, infection, seizure, stroke, scar, recurrence, cerebrospinal fluid leak, neurologic deficit, paralysis, coma and death.  I also discussed with her the possibility of blood transfusion during surgery and consent to transfuse blood products if necessary was also obtained.  All questions and concerns were answered and understanding and agreement with the plan was verbalized.

## 2020-10-21 NOTE — Anesthesia Procedure Notes (Signed)
Central Venous Catheter Insertion Performed by: Roderic Palau, MD, anesthesiologist Start/End5/26/2022 8:09 AM, 10/21/2020 8:19 AM Patient location: OR. Preanesthetic checklist: patient identified, IV checked, site marked, risks and benefits discussed, surgical consent, monitors and equipment checked, pre-op evaluation, timeout performed and anesthesia consent Position: Trendelenburg Lidocaine 1% used for infiltration and patient sedated Hand hygiene performed , maximum sterile barriers used  and Seldinger technique used Catheter size: 8 Fr Total catheter length 16. Central line was placed.Double lumen Procedure performed without using ultrasound guided technique. Attempts: 1 Following insertion, dressing applied, line sutured and Biopatch. Post procedure assessment: blood return through all ports  Patient tolerated the procedure well with no immediate complications.

## 2020-10-21 NOTE — Progress Notes (Signed)
Pharmacy Antibiotic Note  Lydia Jenkins is a 50 y.o. female admitted on 10/21/2020 s/p resection of meningioma.  Pharmacy has been consulted for vancomycin dosing x 24 hrs.   Received pre-operative dose of 1g of vancomycin at 0641 AM today  Plan: Vancomycin 750 mg IV q 12 hrs.  Height: 5\' 4"  (162.6 cm) Weight: 68 kg (150 lb) IBW/kg (Calculated) : 54.7  Temp (24hrs), Avg:97.9 F (36.6 C), Min:97.4 F (36.3 C), Max:98.5 F (36.9 C)  Recent Labs  Lab 10/15/20 0317 10/21/20 0600  WBC 14.5*  --   CREATININE 0.83 0.85    Estimated Creatinine Clearance: 73.3 mL/min (by C-G formula based on SCr of 0.85 mg/dL).    Allergies  Allergen Reactions  . Adhesive [Tape] Other (See Comments)    Blisters  . Lortab [Hydrocodone-Acetaminophen] Itching  . Morphine And Related Other (See Comments)    Spasms  . Penicillins Hives    Has patient had a PCN reaction causing immediate rash, facial/tongue/throat swelling, SOB or lightheadedness with hypotension: No Has patient had a PCN reaction causing severe rash involving mucus membranes or skin necrosis: No Has patient had a PCN reaction that required hospitalization: No Has patient had a PCN reaction occurring within the last 10 years: No If all of the above answers are "NO", then may proceed with Cephalosporin use.   Marland Kitchen Percocet [Oxycodone-Acetaminophen] Itching    Thank you for allowing pharmacy to be a part of this patient's care.  [place] Crane, Roylene Reason, BCCP Clinical Pharmacist  10/21/2020 8:38 PM   [facility] pharmacy phone numbers are listed on [email or web address]

## 2020-10-21 NOTE — Anesthesia Procedure Notes (Signed)
Arterial Line Insertion Start/End5/26/2022 7:15 AM, 10/21/2020 7:20 AM Performed by: Harden Mo, CRNA, CRNA  Patient location: Pre-op. Preanesthetic checklist: patient identified, IV checked, site marked, risks and benefits discussed, surgical consent, monitors and equipment checked, pre-op evaluation and anesthesia consent Lidocaine 1% used for infiltration Right, radial was placed Catheter size: 20 G Hand hygiene performed  and maximum sterile barriers used  Allen's test indicative of satisfactory collateral circulation Attempts: 1 Procedure performed without using ultrasound guided technique. Ultrasound Notes:anatomy identified, needle tip was noted to be adjacent to the nerve/plexus identified and no ultrasound evidence of intravascular and/or intraneural injection Following insertion, dressing applied and Biopatch. Post procedure assessment: normal  Patient tolerated the procedure well with no immediate complications.

## 2020-10-21 NOTE — Progress Notes (Signed)
Attempted to bring pt down for post craniotomy MRI scan. Per transport RN said that pt will not tolerated MRI tonight. TBD tomorrow morning 5/27.

## 2020-10-21 NOTE — Op Note (Signed)
Procedure(s): LEFT FRONTAL CRANIOTOMY FOR RESECTION OF MENINGIOMA APPLICATION OF CRANIAL NAVIGATION Procedure Note  Lydia Jenkins female 50 y.o. 10/21/2020  Procedure(s) and Anesthesia Type:    * LEFT FRONTAL CRANIOTOMY FOR RESECTION OF MENINGIOMA - General    * APPLICATION OF CRANIAL NAVIGATION - General  Surgeon(s) and Role:    Marcello Moores, Dorcas Carrow, MD - Primary    * Dawley, Theodoro Doing, DO - Assisting   Indications: This is a 50 year old woman who presented the hospital with progressive headache and was found to have a large 6.5 cm left frontal lobe extra-axial mass with a fair amount of vascularity and severe mass-effect on the brain.  She was remarkably nonfocal on her neurologic exam.  Surgical resection was recommended to the patient.  Risks, benefits, alternatives, expected convalescence were discussed with her and her family.  Risks discussed included, but are not limited to, bleeding, pain, infection, seizure, scar, stroke, neurologic deficit, recurrence, spinal fluid leak, coma, and death.  Informed consent was obtained.     Surgeon: Vallarie Mare   Assistants: Elwin Sleight, DO.  Please note, there were no qualified trainees available to assist with the procedure.  Assistance was required for brain retraction and tumor resection given the complexity of the case.  Anesthesia: General endotracheal anesthesia  Procedure Detail  1. LEFT FRONTAL CRANIOTOMY FOR RESECTION OF MENINGIOMA, APPLICATION OF CRANIAL NAVIGATION- modifier 22 for unusually difficult resection 2. Use of computer assisted neuronavigation  Patient was brought to the room.  After appropriate lines and monitors were placed, general anesthesia was induced and patient was intubated by the anesthesia service.  Patient positioned supine with head slightly turned to the right.  [place] head clamp was placed and patient was affixed the bed.  Using a preoperative thin cut CT and MRI, 3D reconstruction of the face and  scalp was produced and used to perform surface match registration with the [place] system.  This allowed for neuro navigation through the case.  A curvilinear incision from just in front and above her ear on the left side was planned, curving forward to the hairline.  A small strip of hair was shaved.  The scalp was preprepped with alcohol, prepped and draped in sterile fashion.  1% lidocaine with epinephrine was injected into the skin.  A timeout was performed.  Preoperative antibiotics, dexamethasone, and mannitol were given.  Incision was made with a 10 blade and the temporalis fascia and muscle was incised.  Myocutaneous flap was dissected off the skull and flapped forward and held in place with fishhooks.  Bur holes were used to dissect the dura from the inner table of skull and craniotome was used to perform a large craniotomy.  Meticulous epidural hemostasis was obtained.  The dura was extremely thinned out from significant intracranial pressure.  The dura was lifted from the frontal fossa floor and small ethmoidal feeders were coagulated and cut to help devascularize the tumor.  Dura was then opened and flapped forward in C-shaped manner.  Brain was quite swollen but the tumor was visible at the anterolateral area of the skull base.  The superficial portion of the tumor was dissected from the overlying thin cuff of brain.  The tumor was then developed internally along its base to help devascularize the tumor and reduce the bulk of tumor.  The tumor was very vascular and Numerous cycles of debulking followed by extracapsular dissection was employed.  Large number of arterialized veins were noted traversing from the tumor into  the brain.  Stems from the tumor were coagulated and cut and peel disruption to the brain was minimized.  For the deeper portion of the tumor, the microscope was introduced in field to allow for intraoperative microdissection.  There were large arterialized veins at the deep portion of  the tumor draining into the periventricular system.  Again this, small tumor draining veins connecting to these were coagulated and cut.  The olfactory nerve was identified and dissected from the tumor in the medial inferior portion of the capsule.  At the medial portion of the tumor, there were also small arterial feeders noted crossing the arachnoid plane, presumably from the falcine dura, though no dural attachment was seen.  In this manner, the tumor was resected in total.  Frozen section was sent twice and returned as hypercellular, though no pathologic diagnosis could be rendered by the pathologists.  As there was concern with the vascularity that this could represent a more aggressive meningioma or hemangiopericytoma, the dural base was then resected to the cribriform fossa.  Hyperostotic bone of the orbital roof was drilled with diamond drill bit down to the periorbita which did not appear involved.  Meticulous hemostasis was obtained.  The dura was reconstructed with DuraGen plus followed by dural spray.  The bone flap was then replaced with Stryker cranial plating system.  A medium Hemovac drain was placed in the wound and tunneled out the skin and secured with a stitch.  The temporalis muscle was reapproximated with 2-0 Vicryl stitches.  Skin was closed with 2-0 Vicryl stitches in buried interrupted fashion followed by 4-0 Vicryl Rapide in running manner.  Bacitracin and a head wrap was then placed after patient was removed from the [place] head holder.  Patient was then extubated by the anesthesia service.  All counts were correct at the end of surgery.  No complications were noted.  Please note, given the significant vascularity and size of this tumor, there was significant degree of difficulty in tumor resection in this case, as evidenced by the extended operative time.   Findings: Large extra-axial brain tumor successfully resected.  Anterior cranial fossa/orbital roof base  Estimated Blood  Loss: 2 L         Drains: HEMOVAC         Blood Given: 2 units PRBCs, 1 unit platelets         Specimens: Left frontal lobe tumor         Implants: Stryker cranial plating system        Complications:  * No complications entered in OR log *         Disposition: PACU - hemodynamically stable.         Condition: stable

## 2020-10-21 NOTE — Transfer of Care (Signed)
Immediate Anesthesia Transfer of Care Note  Patient: Lydia Jenkins  Procedure(s) Performed: LEFT FRONTAL CRANIOTOMY FOR RESECTION OF MENINGIOMA (Left ) APPLICATION OF CRANIAL NAVIGATION (N/A )  Patient Location: PACU  Anesthesia Type:General  Level of Consciousness: awake, alert , oriented and patient cooperative  Airway & Oxygen Therapy: Patient Spontanous Breathing and Patient connected to nasal cannula oxygen  Post-op Assessment: Report given to RN and Post -op Vital signs reviewed and stable  Post vital signs: Reviewed and stable  Last Vitals:  Vitals Value Taken Time  BP 115/62 10/21/20 1726  Temp 36.7 C 10/21/20 1711  Pulse 110 10/21/20 1727  Resp 20 10/21/20 1727  SpO2 100 % 10/21/20 1727  Vitals shown include unvalidated device data.  Last Pain:  Vitals:   10/21/20 1711  TempSrc:   PainSc: Asleep         Complications: No complications documented.

## 2020-10-22 ENCOUNTER — Inpatient Hospital Stay (HOSPITAL_COMMUNITY): Payer: 59

## 2020-10-22 ENCOUNTER — Encounter (HOSPITAL_COMMUNITY): Payer: Self-pay | Admitting: Neurosurgery

## 2020-10-22 LAB — BASIC METABOLIC PANEL
Anion gap: 5 (ref 5–15)
BUN: 14 mg/dL (ref 6–20)
CO2: 22 mmol/L (ref 22–32)
Calcium: 8 mg/dL — ABNORMAL LOW (ref 8.9–10.3)
Chloride: 113 mmol/L — ABNORMAL HIGH (ref 98–111)
Creatinine, Ser: 0.71 mg/dL (ref 0.44–1.00)
GFR, Estimated: >60 mL/min (ref >60)
Glucose, Bld: 106 mg/dL — ABNORMAL HIGH (ref 70–99)
Potassium: 3.8 mmol/L (ref 3.5–5.1)
Sodium: 140 mmol/L (ref 135–145)

## 2020-10-22 LAB — TYPE AND SCREEN
ABO/RH(D): B NEG
Antibody Screen: NEGATIVE
Unit division: 0
Unit division: 0

## 2020-10-22 LAB — BPAM RBC
Blood Product Expiration Date: 202206102359
Blood Product Expiration Date: 202206142359
ISSUE DATE / TIME: 202205260957
ISSUE DATE / TIME: 202205260957
Unit Type and Rh: 1700
Unit Type and Rh: 1700

## 2020-10-22 LAB — PREPARE PLATELET PHERESIS: Unit division: 0

## 2020-10-22 LAB — CBC WITH DIFFERENTIAL/PLATELET
Abs Immature Granulocytes: 0.35 10*3/uL — ABNORMAL HIGH (ref 0.00–0.07)
Basophils Absolute: 0 10*3/uL (ref 0.0–0.1)
Basophils Relative: 0 %
Eosinophils Absolute: 0 10*3/uL (ref 0.0–0.5)
Eosinophils Relative: 0 %
HCT: 24.4 % — ABNORMAL LOW (ref 36.0–46.0)
Hemoglobin: 8.2 g/dL — ABNORMAL LOW (ref 12.0–15.0)
Immature Granulocytes: 2 %
Lymphocytes Relative: 14 %
Lymphs Abs: 2.2 10*3/uL (ref 0.7–4.0)
MCH: 30 pg (ref 26.0–34.0)
MCHC: 33.6 g/dL (ref 30.0–36.0)
MCV: 89.4 fL (ref 80.0–100.0)
Monocytes Absolute: 1.5 10*3/uL — ABNORMAL HIGH (ref 0.1–1.0)
Monocytes Relative: 10 %
Neutro Abs: 11.5 10*3/uL — ABNORMAL HIGH (ref 1.7–7.7)
Neutrophils Relative %: 74 %
Platelets: 167 10*3/uL (ref 150–400)
RBC: 2.73 MIL/uL — ABNORMAL LOW (ref 3.87–5.11)
RDW: 13.9 % (ref 11.5–15.5)
WBC: 15.6 10*3/uL — ABNORMAL HIGH (ref 4.0–10.5)
nRBC: 0 % (ref 0.0–0.2)

## 2020-10-22 LAB — BPAM PLATELET PHERESIS
Blood Product Expiration Date: 202205272359
ISSUE DATE / TIME: 202205261303
Unit Type and Rh: 6200

## 2020-10-22 MED ORDER — DEXAMETHASONE 0.5 MG PO TABS: 1.0000 mg | ORAL_TABLET | Freq: Two times a day (BID) | ORAL | Status: DC

## 2020-10-22 MED ORDER — DEXAMETHASONE SODIUM PHOSPHATE 10 MG/ML IJ SOLN
6.0000 mg | Freq: Four times a day (QID) | INTRAMUSCULAR | Status: DC
  Administered 2020-10-22: 6 mg via INTRAVENOUS
  Filled 2020-10-22: qty 1

## 2020-10-22 MED ORDER — LORAZEPAM 2 MG/ML IJ SOLN
INTRAMUSCULAR | Status: AC
  Administered 2020-10-22: 1 mg via INTRAVENOUS
  Filled 2020-10-22: qty 1

## 2020-10-22 MED ORDER — THYROID 30 MG PO TABS
30.0000 mg | ORAL_TABLET | Freq: Every day | ORAL | Status: DC
  Administered 2020-10-23 – 2020-10-26 (×4): 30 mg via ORAL
  Filled 2020-10-22 (×7): qty 1

## 2020-10-22 MED ORDER — DEXAMETHASONE 4 MG PO TABS
4.0000 mg | ORAL_TABLET | Freq: Three times a day (TID) | ORAL | Status: AC
  Administered 2020-10-24 – 2020-10-26 (×6): 4 mg via ORAL
  Filled 2020-10-22 (×6): qty 1

## 2020-10-22 MED ORDER — CALCIUM CARBONATE ANTACID 500 MG PO CHEW
1.0000 | CHEWABLE_TABLET | Freq: Every day | ORAL | Status: DC | PRN
  Administered 2020-10-22 – 2020-10-23 (×2): 200 mg via ORAL
  Administered 2020-10-25: 400 mg via ORAL
  Filled 2020-10-22 (×2): qty 2
  Filled 2020-10-22: qty 1

## 2020-10-22 MED ORDER — DEXAMETHASONE 4 MG PO TABS
4.0000 mg | ORAL_TABLET | Freq: Four times a day (QID) | ORAL | Status: AC
  Administered 2020-10-22 – 2020-10-24 (×8): 4 mg via ORAL
  Filled 2020-10-22 (×8): qty 1

## 2020-10-22 MED ORDER — DEXAMETHASONE 0.5 MG PO TABS: 1.0000 mg | ORAL_TABLET | Freq: Every day | ORAL | Status: DC

## 2020-10-22 MED ORDER — HEPARIN SODIUM (PORCINE) 5000 UNIT/ML IJ SOLN
5000.0000 [IU] | Freq: Two times a day (BID) | INTRAMUSCULAR | Status: DC
  Administered 2020-10-23 – 2020-10-26 (×7): 5000 [IU] via SUBCUTANEOUS
  Filled 2020-10-22 (×7): qty 1

## 2020-10-22 MED ORDER — LORAZEPAM 2 MG/ML IJ SOLN
0.5000 mg | INTRAMUSCULAR | Status: DC | PRN
  Administered 2020-10-22: 0.5 mg via INTRAVENOUS
  Administered 2020-10-23 – 2020-10-26 (×13): 1 mg via INTRAVENOUS
  Filled 2020-10-22 (×15): qty 1

## 2020-10-22 MED ORDER — DEXAMETHASONE 4 MG PO TABS: 2.0000 mg | ORAL_TABLET | Freq: Two times a day (BID) | ORAL | Status: DC

## 2020-10-22 MED ORDER — DEXAMETHASONE 4 MG PO TABS
2.0000 mg | ORAL_TABLET | Freq: Three times a day (TID) | ORAL | Status: DC
  Filled 2020-10-22: qty 1

## 2020-10-22 MED FILL — Thrombin For Soln 5000 Unit: CUTANEOUS | Qty: 5000 | Status: AC

## 2020-10-22 NOTE — Anesthesia Postprocedure Evaluation (Signed)
Anesthesia Post Note  Patient: Lydia Jenkins  Procedure(s) Performed: LEFT FRONTAL CRANIOTOMY FOR RESECTION OF MENINGIOMA (Left ) APPLICATION OF CRANIAL NAVIGATION (N/A )     Patient location during evaluation: Other Anesthesia Type: General Level of consciousness: awake and alert Pain management: pain level controlled Vital Signs Assessment: post-procedure vital signs reviewed and stable Respiratory status: spontaneous breathing, nonlabored ventilation and respiratory function stable Cardiovascular status: blood pressure returned to baseline and stable Postop Assessment: no apparent nausea or vomiting Anesthetic complications: no   No complications documented.  Last Vitals:  Vitals:   10/22/20 0500 10/22/20 0530  BP: 98/60 104/62  Pulse: 78 80  Resp: 16 18  Temp:    SpO2: 97% 97%    Last Pain:  Vitals:   10/22/20 0425  TempSrc:   PainSc: Asleep                 Ryott Rafferty,W. EDMOND

## 2020-10-22 NOTE — Evaluation (Signed)
Occupational Therapy Evaluation Patient Details Name: Lydia Jenkins MRN: [id number] DOB: [birth year] Today's Date: 10/22/2020    History of Present Illness 50 yo female L frontal craniotomy for resection of meningioma 5/26 PMH hypothyroidism hiatal hernia spincter of Oddi dysfunciton depression anxiety   Clinical Impression   Patient is s/p L frontal cranitomy with resection 5/26 surgery resulting in functional limitations due to the deficits listed below (see OT problem list). Pt was indep prior to admission and now requires (A) for balance. Pt noted to have light sensitivity and sound sensitivity post op. Pt reports no pain after allowed to sit up and move. Pt reports mild nausea but resolved with cool wash cloth / smell of alcohol swab. Patient will benefit from skilled OT acutely to increase independence and safety with ADLS to allow discharge outpatient.     Follow Up Recommendations  Outpatient OT    Equipment Recommendations  None recommended by OT    Recommendations for Other Services       Precautions / Restrictions Precautions Precautions: Fall Precaution Comments: hemovac drain L side of skull , foley Restrictions Weight Bearing Restrictions: No      Mobility Bed Mobility Overal bed mobility: Needs Assistance Bed Mobility: Rolling;Supine to Sit;Sit to Supine Rolling: Min assist   Supine to sit: Min assist Sit to supine: Min assist   General bed mobility comments: cues for sequence and elevate trunk from bed surface. Min (A) to help BIL LE onto bed surface    Transfers Overall transfer level: Needs assistance Equipment used: 2 person hand held assist Transfers: Sit to/from Stand Sit to Stand: Min guard;+2 safety/equipment         General transfer comment: pt reaching for external support. pt holding therapist for support. pt iwth L visual field occluded due to edema and turning head with guarded posture    Balance Overall balance assessment: Mild  deficits observed, not formally tested                                         ADL either performed or assessed with clinical judgement   ADL Overall ADL's : Needs assistance/impaired     Grooming: Wash/dry face;Supervision/safety;Sitting   Upper Body Bathing: Supervision/ safety   Lower Body Bathing: Min guard   Upper Body Dressing : Supervision/safety   Lower Body Dressing: Min guard   Toilet Transfer: +2 for physical assistance;Minimal assistance           Functional mobility during ADLs: +2 for physical assistance;Minimal assistance General ADL Comments: pt reports sound and light sensitivity     Vision   Additional Comments: L eye occluded due to edema at this time. light sensitive     Perception     Praxis      Pertinent Vitals/Pain Pain Assessment: 0-10 Pain Score: 3  Pain Location: L side of head Pain Descriptors / Indicators: Discomfort;Grimacing;Headache Pain Intervention(s): Monitored during session;Premedicated before session;Repositioned (reports no pain with sitting)     Hand Dominance Right   Extremity/Trunk Assessment Upper Extremity Assessment Upper Extremity Assessment: Overall WFL for tasks assessed   Lower Extremity Assessment Lower Extremity Assessment: Defer to PT evaluation   Cervical / Trunk Assessment Cervical / Trunk Assessment: Other exceptions Cervical / Trunk Exceptions: reports low back pain but normal alignment   Communication Communication Communication: No difficulties   Cognition Arousal/Alertness: Awake/alert Behavior During Therapy: Flat affect Overall  Cognitive Status: Within Functional Limits for tasks assessed                                     General Comments  VSS hair placed in loose braid to the R side of head    Exercises     Shoulder Instructions      Home Living Family/patient expects to be discharged to:: Private residence Living Arrangements: Spouse/significant  other;Children Available Help at Discharge: Family;Available 24 hours/day Type of Home: House Home Access: Stairs to enter [employer] of Steps: 3 Entrance Stairs-Rails: None Home Layout: Multi-level;Able to live on main level with bedroom/bathroom     Bathroom Shower/Tub: Occupational psychologist: Standard     Home Equipment: Shower seat;Hand held shower head   Additional Comments: has 4 cats in home "Pratt and dixie are her cats" and 2 cats are daughters willow and Emmy      Prior Functioning/Environment Level of Independence: Independent        Comments: completely independent, not working, but loves tennis and would like to get back to playing        OT Problem List: Decreased activity tolerance;Impaired balance (sitting and/or standing);Decreased coordination;Decreased safety awareness;Decreased knowledge of precautions      OT Treatment/Interventions: Self-care/ADL training;Therapeutic exercise;Neuromuscular education;Energy conservation;DME and/or AE instruction;Therapeutic activities;Patient/family education;Balance training;Visual/perceptual remediation/compensation    OT Goals(Current goals can be found in the care plan section) Acute Rehab OT Goals Patient Stated Goal: none stated at this time OT Goal Formulation: With patient/family Time For Goal Achievement: 11/05/20 Potential to Achieve Goals: Good  OT Frequency: Min 3X/week   Barriers to D/C:            Co-evaluation PT/OT/SLP Co-Evaluation/Treatment: Yes Reason for Co-Treatment: Necessary to address cognition/behavior during functional activity;For patient/therapist safety;To address functional/ADL transfers   OT goals addressed during session: ADL's and self-care;Proper use of Adaptive equipment and DME;Strengthening/ROM      AM-PAC OT "6 Clicks" Daily Activity     Outcome Measure Help from another person eating meals?: A Little Help from another person taking care of  personal grooming?: A Little Help from another person toileting, which includes using toliet, bedpan, or urinal?: A Little Help from another person bathing (including washing, rinsing, drying)?: A Little Help from another person to put on and taking off regular upper body clothing?: A Little Help from another person to put on and taking off regular lower body clothing?: A Little 6 Click Score: 18   End of Session Nurse Communication: Mobility status;Precautions  Activity Tolerance: Patient tolerated treatment well Patient left: in bed;with call bell/phone within reach;with bed alarm set;with family/visitor present  OT Visit Diagnosis: Unsteadiness on feet (R26.81);Muscle weakness (generalized) (M62.81)                Time: 4627-0350 OT Time Calculation (min): 23 min Charges:  OT General Charges $OT Visit: 1 Visit OT Evaluation $OT Eval Moderate Complexity: 1 Mod   Brynn, OTR/L  Acute Rehabilitation Services Pager: [phone] Office: [phone] .   Jeri Modena 10/22/2020, 11:39 AM

## 2020-10-22 NOTE — Evaluation (Signed)
Physical Therapy Evaluation Patient Details Name: Lydia Jenkins MRN: [id number] DOB: 1970/08/04 Today's Date: 10/22/2020   History of Present Illness  50 yo female L frontal craniotomy for resection of meningioma 5/26 PMH hypothyroidism hiatal hernia spincter of Oddi dysfunciton depression anxiety    Clinical Impression  Pt in bed upon arrival of PT, agreeable to evaluation at this time. Prior to admission the pt was completely independent with all mobility, enjoys playing tennis most days for activity. She lives in a home with her spouse and daughter with 3 steps to enter but bedroom on the main floor. The pt now presents with limitations in functional mobility, activity tolerance, power, and dynamic stability due to above dx, and will continue to benefit from skilled PT to address these deficits. The pt was able to complete initial bed mobility, sitting EOB, and sit-stand transfers with minA-minG for pt comfort and safety. The pt did benefit from UE support for ambulation in the room, but had no overt LOB and was able to complete with all VSS and BP steady. The pt did c/o light and sound sensitivity, and was encouraged to bring sunglasses to improve comfort with future mobility in the hall. The pt will continue to benefit from skilled PT acutely to address deficits in activity tolerance and dynamic stability, will continue to assess for need for follow-up therapies after d/c.      Follow Up Recommendations Outpatient PT (vs no PT follow up pending progression)    Equipment Recommendations  None recommended by PT    Recommendations for Other Services       Precautions / Restrictions Precautions Precautions: Fall Precaution Comments: hemovac drain L side of skull , foley Restrictions Weight Bearing Restrictions: No      Mobility  Bed Mobility Overal bed mobility: Needs Assistance Bed Mobility: Rolling;Supine to Sit;Sit to Supine Rolling: Min assist   Supine to sit: Min  assist Sit to supine: Min assist   General bed mobility comments: cues for sequence and elevate trunk from bed surface. Min (A) to help BIL LE onto bed surface    Transfers Overall transfer level: Needs assistance Equipment used: 2 person hand held assist Transfers: Sit to/from Stand Sit to Stand: Min guard;+2 safety/equipment         General transfer comment: pt reaching for external support. pt holding therapist for support. pt iwth L visual field occluded due to edema and turning head with guarded posture  Ambulation/Gait Ambulation/Gait assistance: Min assist;Min guard Gait Distance (Feet): 25 Feet Assistive device: 2 person hand held assist;1 person hand held assist Gait Pattern/deviations: Step-through pattern;Decreased stride length;Wide base of support Gait velocity: decreased   General Gait Details: small steps with minimal clearance, wide BOS with increased lateral movement. minA through HHA initially amd with fatigue, able to complete with minG at times     Balance Overall balance assessment: Mild deficits observed, not formally tested                                           Pertinent Vitals/Pain Pain Assessment: 0-10 Pain Score: 3  Pain Location: L side of head Pain Descriptors / Indicators: Discomfort;Grimacing;Headache Pain Intervention(s): Limited activity within patient's tolerance;Monitored during session;Repositioned    Home Living Family/patient expects to be discharged to:: Private residence Living Arrangements: Spouse/significant other;Children Available Help at Discharge: Family;Available 24 hours/day Type of Home: House Home Access: Stairs  to enter Entrance Stairs-Rails: None Entrance Stairs-Number of Steps: 3 Home Layout: Multi-level;Able to live on main level with bedroom/bathroom Home Equipment: Shower seat;Hand held shower head Additional Comments: has 4 cats in home "kiki and dixie are her cats" and 2 cats are daughters  willow and Emmy    Prior Function Level of Independence: Independent         Comments: completely independent, not working, but loves tennis and would like to get back to playing     Hand Dominance   Dominant Hand: Right    Extremity/Trunk Assessment   Upper Extremity Assessment Upper Extremity Assessment: Defer to OT evaluation    Lower Extremity Assessment Lower Extremity Assessment: Overall WFL for tasks assessed    Cervical / Trunk Assessment Cervical / Trunk Assessment: Other exceptions Cervical / Trunk Exceptions: reports low back pain but normal alignment  Communication   Communication: No difficulties  Cognition Arousal/Alertness: Awake/alert Behavior During Therapy: Flat affect Overall Cognitive Status: Within Functional Limits for tasks assessed                                        General Comments General comments (skin integrity, edema, etc.): VSS on RA. BP steady through all changes in position    Exercises     Assessment/Plan    PT Assessment Patient needs continued PT services  PT Problem List Decreased strength;Decreased activity tolerance;Decreased mobility;Decreased balance;Pain       PT Treatment Interventions DME instruction;Gait training;Stair training;Functional mobility training;Therapeutic activities;Therapeutic exercise;Balance training;Patient/family education    PT Goals (Current goals can be found in the Care Plan section)  Acute Rehab PT Goals Patient Stated Goal: to get back to playing tennis PT Goal Formulation: With patient Time For Goal Achievement: 11/05/20 Potential to Achieve Goals: Good    Frequency Min 4X/week   Barriers to discharge        Co-evaluation PT/OT/SLP Co-Evaluation/Treatment: Yes Reason for Co-Treatment: Necessary to address cognition/behavior during functional activity;For patient/therapist safety;To address functional/ADL transfers PT goals addressed during session:  Mobility/safety with mobility;Strengthening/ROM OT goals addressed during session: ADL's and self-care;Proper use of Adaptive equipment and DME;Strengthening/ROM       AM-PAC PT "6 Clicks" Mobility  Outcome Measure Help needed turning from your back to your side while in a flat bed without using bedrails?: A Little Help needed moving from lying on your back to sitting on the side of a flat bed without using bedrails?: A Little Help needed moving to and from a bed to a chair (including a wheelchair)?: A Little Help needed standing up from a chair using your arms (e.g., wheelchair or bedside chair)?: A Little Help needed to walk in hospital room?: A Little Help needed climbing 3-5 steps with a railing? : A Lot 6 Click Score: 17    End of Session Equipment Utilized During Treatment: Gait belt Activity Tolerance: Patient tolerated treatment well;Patient limited by pain (nausea) Patient left: in bed;with call bell/phone within reach;with family/visitor present Nurse Communication: Mobility status PT Visit Diagnosis: Other abnormalities of gait and mobility (R26.89);Pain Pain - part of body:  (headache)    Time: 4098-1191 PT Time Calculation (min) (ACUTE ONLY): 25 min   Charges:   PT Evaluation $PT Eval Low Complexity: 1 Low          Karma Ganja, PT, DPT   Acute Rehabilitation Department Pager #: [phone]  Amra Shukla C  Larena Glassman 10/22/2020, 12:03 PM

## 2020-10-22 NOTE — Progress Notes (Addendum)
Subjective: Patient reports bifrontal headache  Objective: Vital signs in last 24 hours: Temp:  [97.4 F (36.3 C)-98.5 F (36.9 C)] 98.5 F (36.9 C) (05/27 0800) Pulse Rate:  [67-108] 89 (05/27 0900) Resp:  [0-23] 21 (05/27 0900) BP: (71-132)/(46-83) 101/61 (05/27 0900) SpO2:  [96 %-100 %] 98 % (05/27 0900) Arterial Line BP: (108-354)/(66-330) 354/330 (05/27 0030)  Intake/Output from previous day: 05/26 0701 - 05/27 0700 In: 7205.2 [P.O.:120; I.V.:5433.3; Blood:902; IV Piggyback:749.9] Out: 8700 [Urine:6275; Drains:275; Blood:2150] Intake/Output this shift: Total I/O In: 162.5 [I.V.:150; IV Piggyback:12.5] Out: -  Eyes open spontaneously, PERRL Head wrap removed at bedside, incision c/d FC x 4 Ox3   Lab Results: Recent Labs    10/21/20 1419 10/22/20 0128  WBC  --  15.6*  HGB 10.5* 8.2*  HCT 31.0* 24.4*  PLT  --  167   BMET Recent Labs    10/21/20 0600 10/21/20 1014 10/21/20 1419 10/22/20 0128  NA 137   < > 145 140  K 3.9   < > 4.3 3.8  CL 103  --   --  113*  CO2 25  --   --  22  GLUCOSE 107*  --   --  106*  BUN 22*  --   --  14  CREATININE 0.85  --   --  0.71  CALCIUM 9.1  --   --  8.0*   < > = values in this interval not displayed.    Studies/Results: DG CHEST PORT 1 VIEW  Result Date: 10/21/2020 CLINICAL DATA:  Central line placement. EXAM: PORTABLE CHEST 1 VIEW COMPARISON:  Chest CT 10/13/2020 FINDINGS: Right subclavian central line tip is in the mid lower SVC. No pneumothorax. Normal heart size and mediastinal contours. No focal airspace disease. No pleural effusion or pneumothorax. No pulmonary edema. No acute osseous abnormalities are seen. IMPRESSION: Right subclavian central line with tip in the mid lower SVC. No pneumothorax. Electronically Signed   By: Keith Rake M.D.   On: 10/21/2020 18:52    Assessment/Plan: S/p resection of large left frontal tumor - patient too uncomfortable currently for MRI, so will obtain CT head.  If this shows  expected postop changes, will d/c central line, Foley, start hep subQ, taper steroids, and mobilize further - MRI brain later today or tomorrow - CBC check tomorrow - cont ICU for today   Vallarie Mare 10/22/2020, 10:21 AM

## 2020-10-22 NOTE — Progress Notes (Signed)
Spoke to patient with night shift RN Badi about having an MRI today. Patient states that her head is still hurting her badly and she doesn't feel she can do the MRI yet.

## 2020-10-22 NOTE — Progress Notes (Signed)
Patient transported to and from CT without any complications. VSS and patient resting in room.

## 2020-10-23 ENCOUNTER — Inpatient Hospital Stay (HOSPITAL_COMMUNITY): Payer: 59

## 2020-10-23 LAB — CBC WITH DIFFERENTIAL/PLATELET
Abs Immature Granulocytes: 0.18 10*3/uL — ABNORMAL HIGH (ref 0.00–0.07)
Basophils Absolute: 0 10*3/uL (ref 0.0–0.1)
Basophils Relative: 0 %
Eosinophils Absolute: 0 10*3/uL (ref 0.0–0.5)
Eosinophils Relative: 0 %
HCT: 26.5 % — ABNORMAL LOW (ref 36.0–46.0)
Hemoglobin: 9.1 g/dL — ABNORMAL LOW (ref 12.0–15.0)
Immature Granulocytes: 1 %
Lymphocytes Relative: 9 %
Lymphs Abs: 1.1 10*3/uL (ref 0.7–4.0)
MCH: 30 pg (ref 26.0–34.0)
MCHC: 34.3 g/dL (ref 30.0–36.0)
MCV: 87.5 fL (ref 80.0–100.0)
Monocytes Absolute: 0.8 10*3/uL (ref 0.1–1.0)
Monocytes Relative: 7 %
Neutro Abs: 10.3 10*3/uL — ABNORMAL HIGH (ref 1.7–7.7)
Neutrophils Relative %: 83 %
Platelets: 174 10*3/uL (ref 150–400)
RBC: 3.03 MIL/uL — ABNORMAL LOW (ref 3.87–5.11)
RDW: 13.2 % (ref 11.5–15.5)
WBC: 12.5 10*3/uL — ABNORMAL HIGH (ref 4.0–10.5)
nRBC: 0 % (ref 0.0–0.2)

## 2020-10-23 MED ORDER — GADOBUTROL 1 MMOL/ML IV SOLN
7.0000 mL | Freq: Once | INTRAVENOUS | Status: AC | PRN
  Administered 2020-10-23: 7 mL via INTRAVENOUS

## 2020-10-23 NOTE — Progress Notes (Signed)
Physical Therapy Treatment Patient Details Name: Lydia Jenkins MRN: [id number] DOB: 1971-04-17 Today's Date: 10/23/2020    History of Present Illness The pt is a 50 yo female L presenting 5/26 s/p frontal craniotomy for resection of meningioma. PMH includes: hypothyroidism, hiatal hernia, spincter of Oddi dysfunciton, depression, and anxiety.    PT Comments    The pt was seen for continued progression of OOB mobility and activity tolerance this session, but was limited by exacerbation of dizziness and nausea with movement this session. The pt required increased assist to maintain static sitting and standing this session due to significant increase in sway and inability to make postural corrections without cues. The pt states she could feel herself swaying and moving, but was unable to initiate correction until cued or until too far outside BOS to correct without assist. The pt also experienced increase in HR to 168 bpm max with static standing for 1-2 min, and required seated rest for 2-3 min to recover. The pt also demonstrated slightly increased difficulty with word-finding this session, but was able to self-identify and correct mistakes. The pt will continue to benefit from skilled PT acutely to further progress functional mobility and stability.    Follow Up Recommendations  Outpatient PT (vs no PT follow up pending progression)     Equipment Recommendations  None recommended by PT    Recommendations for Other Services       Precautions / Restrictions Precautions Precautions: Fall Precaution Comments: hemovac drain L side of skull , foley Restrictions Weight Bearing Restrictions: No    Mobility  Bed Mobility Overal bed mobility: Needs Assistance Bed Mobility: Supine to Sit;Sit to Supine     Supine to sit: Min guard;HOB elevated Sit to supine: Min guard;HOB elevated   General bed mobility comments: pt able to complete all movements without physical assist. did need  immmediate minA to steady once sitting EOB    Transfers Overall transfer level: Needs assistance Equipment used: 1 person hand held assist Transfers: Sit to/from Stand Sit to Stand: Min assist         General transfer comment: minA to power up and min-modA to steady. pt with significant sway outside BOS in all directions requiring increased assist to steady. HR to 168bpm max with static standing  Ambulation/Gait Ambulation/Gait assistance: Mod assist Gait Distance (Feet): 10 Feet Assistive device: 1 person hand held assist Gait Pattern/deviations: Step-through pattern;Decreased stride length;Wide base of support Gait velocity: decreased   General Gait Details: small steps with significant sway, required UE support and modA to steady. wide BOS. cued to maintain eyes open.      Balance Overall balance assessment: Needs assistance Sitting-balance support: Bilateral upper extremity supported;Feet supported Sitting balance-Leahy Scale: Poor Sitting balance - Comments: minA to steady due to increased sway outside BOS   Standing balance support: Single extremity supported;During functional activity Standing balance-Leahy Scale: Poor Standing balance comment: reliant on UE support and min-modA                            Cognition Arousal/Alertness: Awake/alert Behavior During Therapy: Flat affect Overall Cognitive Status: Within Functional Limits for tasks assessed                                 General Comments: pt with slight increase in word-finding mistakes today, able to identify and self-correct      Exercises  General Comments General comments (skin integrity, edema, etc.): BP stable with all changes in position. Pt reports nauseous and dizzy through session. HR 100 with static sitting, increased to 168 with static standing      Pertinent Vitals/Pain Pain Assessment: Faces Pain Score: 5  Faces Pain Scale: Hurts even more Pain  Location: L side of head Pain Descriptors / Indicators: Discomfort;Grimacing;Headache Pain Intervention(s): Limited activity within patient's tolerance;Monitored during session;Repositioned;Premedicated before session           PT Goals (current goals can now be found in the care plan section) Acute Rehab PT Goals Patient Stated Goal: to get back to playing tennis PT Goal Formulation: With patient Time For Goal Achievement: 11/05/20 Potential to Achieve Goals: Good Progress towards PT goals: Progressing toward goals    Frequency    Min 4X/week      PT Plan Current plan remains appropriate       AM-PAC PT "6 Clicks" Mobility   Outcome Measure  Help needed turning from your back to your side while in a flat bed without using bedrails?: A Little Help needed moving from lying on your back to sitting on the side of a flat bed without using bedrails?: A Little Help needed moving to and from a bed to a chair (including a wheelchair)?: A Little Help needed standing up from a chair using your arms (e.g., wheelchair or bedside chair)?: A Little Help needed to walk in hospital room?: A Little Help needed climbing 3-5 steps with a railing? : A Lot 6 Click Score: 17    End of Session Equipment Utilized During Treatment: Gait belt Activity Tolerance: Treatment limited secondary to medical complications (Comment) (elevated HR (to 168 max), increased dizziness and nausea) Patient left: in bed;with call bell/phone within reach;with family/visitor present Nurse Communication: Mobility status PT Visit Diagnosis: Other abnormalities of gait and mobility (R26.89);Pain     Time: 1459-1531 PT Time Calculation (min) (ACUTE ONLY): 32 min  Charges:  $Gait Training: 8-22 mins $Therapeutic Activity: 8-22 mins                     Karma Ganja, PT, DPT   Acute Rehabilitation Department Pager #: [phone]   Otho Bellows 10/23/2020, 4:05 PM

## 2020-10-23 NOTE — Progress Notes (Signed)
Subjective: Patient reports doing well.  Quite painful over scalp with headache, improved with fentanyl.    Objective: Vital signs in last 24 hours: Temp:  [98.2 F (36.8 C)-98.8 F (37.1 C)] 98.2 F (36.8 C) (05/28 0400) Pulse Rate:  [65-92] 77 (05/28 0500) Resp:  [14-21] 20 (05/28 0500) BP: (81-119)/(54-79) 104/63 (05/28 0500) SpO2:  [96 %-99 %] 97 % (05/28 0500)  Intake/Output from previous day: 05/27 0701 - 05/28 0700 In: 1426.2 [I.V.:1276.3; IV Piggyback:149.9] Out: 3750 [Urine:3550; Drains:200] Intake/Output this shift: No intake/output data recorded.  Physical Exam: Left eye swollen, but PERRL, EOMI.  No drift.  Suture line CDI.  Lab Results: Recent Labs    10/22/20 0128 10/23/20 0146  WBC 15.6* 12.5*  HGB 8.2* 9.1*  HCT 24.4* 26.5*  PLT 167 174   BMET Recent Labs    10/21/20 0600 10/21/20 1014 10/21/20 1419 10/22/20 0128  NA 137   < > 145 140  K 3.9   < > 4.3 3.8  CL 103  --   --  113*  CO2 25  --   --  22  GLUCOSE 107*  --   --  106*  BUN 22*  --   --  14  CREATININE 0.85  --   --  0.71  CALCIUM 9.1  --   --  8.0*   < > = values in this interval not displayed.    Studies/Results: CT HEAD WO CONTRAST  Result Date: 10/22/2020 CLINICAL DATA:  Follow-up craniotomy for tumor removal. EXAM: CT HEAD WITHOUT CONTRAST TECHNIQUE: Contiguous axial images were obtained from the base of the skull through the vertex without intravenous contrast. COMPARISON:  MRI 10/14/2020 FINDINGS: Brain: Interval left frontal craniotomy for tumor resection. 6.5 cm mass previously seen in the left anterior cranial fossa apparently completely resected. Edematous change in the left frontal lobe, with small amount of blood in the subarachnoid spaces and possibly a small amount of intraparenchymal hemorrhage. Small amount subdural fluid in the right frontal region, 4 mm in thickness, subsequent to the reduction in mass effect and resolution of left-to-right shift. No hydrocephalus.  Vascular: No vascular finding. Skull: No complicating feature related to the craniotomy. Sinuses/Orbits: Clear/normal Other: None IMPRESSION: Apparent gross total resection of the large left frontal region tumor. Resolution of mass effect and left-to-right shift. Edema in the left frontal lobe. Small amount of regional subarachnoid blood. Possible small amount of intraparenchymal blood, not unexpected. 4 mm thick low-density subdural fluid collection in the right frontal region subsequent to the resolution of the mass effect and shift. Electronically Signed   By: Nelson Chimes M.D.   On: 10/22/2020 11:13   MR BRAIN W WO CONTRAST  Result Date: 10/23/2020 CLINICAL DATA:  Postop tumor resection EXAM: MRI HEAD WITHOUT AND WITH CONTRAST TECHNIQUE: Multiplanar, multiecho pulse sequences of the brain and surrounding structures were obtained without and with intravenous contrast. CONTRAST:  75mL GADAVIST GADOBUTROL 1 MMOL/ML IV SOLN COMPARISON:  10/14/2020 FINDINGS: Brain: Interval resection of the left frontal dural based mass with expected edema and blood products in the operative region when correlated with surgical description. Midline structures have returned to normal positioning. Small hygroma has developed along the right frontal convexity, measuring 5 mm in maximal thickness. No hydrocephalus, infarct, or residual enhancing mass. Wispy enhancement along the postoperative brain surface is considered reactive. Vascular: Preserved flow voids and vascular enhancement Skull and upper cervical spine: Unremarkable craniotomy site Sinuses/Orbits: Negative IMPRESSION: No residual masslike enhancement.  No acute or  unexpected finding. Electronically Signed   By: Monte Fantasia M.D.   On: 10/23/2020 06:52   DG CHEST PORT 1 VIEW  Result Date: 10/21/2020 CLINICAL DATA:  Central line placement. EXAM: PORTABLE CHEST 1 VIEW COMPARISON:  Chest CT 10/13/2020 FINDINGS: Right subclavian central line tip is in the mid lower SVC.  No pneumothorax. Normal heart size and mediastinal contours. No focal airspace disease. No pleural effusion or pneumothorax. No pulmonary edema. No acute osseous abnormalities are seen. IMPRESSION: Right subclavian central line with tip in the mid lower SVC. No pneumothorax. Electronically Signed   By: Keith Rake M.D.   On: 10/21/2020 18:52    Assessment/Plan: Patient is doing well.  Postop MRI shows GTR.  Still requiring iv analgesia.  Will keep in hospital today.  Drain has put out 200 cc.  Will leave in place until AM and, if doing well, discharge home at that point.    LOS: 2 days    Peggyann Shoals, MD 10/23/2020, 8:34 AM

## 2020-10-23 NOTE — Progress Notes (Signed)
Occupational Therapy Treatment Patient Details Name: Lydia Jenkins MRN: [id number] DOB: 1971-03-29 Today's Date: 10/23/2020    History of present illness The pt is a 50 yo female L presenting 5/26 s/p frontal craniotomy for resection of meningioma. PMH includes: hypothyroidism, hiatal hernia, spincter of Oddi dysfunciton, depression, and anxiety.   OT comments  Pt required increased assist with functional transfers this date.  She c/o urinary urgency and had incontinence while ambulating to BR.  Pt with c/o dizziness with impaired balance with HR to 161.   She requires min A, overall for ADLs.  RN and NP notified of above.  Will continue to follow.   Follow Up Recommendations  Outpatient OT    Equipment Recommendations  None recommended by OT    Recommendations for Other Services      Precautions / Restrictions Precautions Precautions: Fall Precaution Comments: hemovac drain L side of skull , foley Restrictions Weight Bearing Restrictions: No       Mobility Bed Mobility Overal bed mobility: Needs Assistance Bed Mobility: Supine to Sit;Sit to Supine Rolling: Min guard   Supine to sit: Min guard Sit to supine: Min guard;HOB elevated   General bed mobility comments: requires increased time    Transfers Overall transfer level: Needs assistance Equipment used: Rolling walker (2 wheeled) Transfers: Sit to/from [facility] Sit to Stand: Min guard Stand pivot transfers: Min guard       General transfer comment: min guard assist for safety    Balance Overall balance assessment: Needs assistance Sitting-balance support: Feet supported Sitting balance-Leahy Scale: Good Sitting balance - Comments: able to adjust socks without LOB   Standing balance support: Single extremity supported;During functional activity Standing balance-Leahy Scale: Fair Standing balance comment: able to pull pants over hips without LOB                           ADL  either performed or assessed with clinical judgement   ADL Overall ADL's : Needs assistance/impaired                     Lower Body Dressing: Min guard;Sit to/from stand Lower Body Dressing Details (indicate cue type and reason): using RW to stabilize Toilet Transfer: Minimal assistance;Ambulation;Comfort height toilet;Grab bars;RW Toilet Transfer Details (indicate cue type and reason): min A to steady.  Pt with decreased balance upon exiting the BR and requiring min A for balance using RW. HR increased to 161 with LOB and pt report of dizziness Toileting- Clothing Manipulation and Hygiene: Min guard;Sit to/from stand       Functional mobility during ADLs: Minimal assistance;Rolling walker       Vision   Additional Comments: Rt EOMs WFL without nystagmus.  pupil reactive.  Pt reports her vision in Rt eye is very blurry.  Lt eye is swollen shut.  Upon further questioning, she reports her vision is 20/350 in her Rt eye.  She does report seeing snakelike objects and rods in her Rt periphery as well as flashing purple lights which are new onset today, but reports she has been seeing net and web wire structures and asteroid like objects on previous days   Perception     Praxis      Cognition Arousal/Alertness: Awake/alert Behavior During Therapy: Flat affect Overall Cognitive Status: Within Functional Limits for tasks assessed  General Comments: Pt rports of feeling anxious at times, and that her brain and electrons are buzzing.  She was noted to provide occasional insconsistencies in reporting symptoms.  ? if memory deficits, attentional deficits, vs, anxiety        Exercises     Shoulder Instructions       General Comments Pt with c/o intermittent dizziness - sensation of spinning and feeling drunk.  Pt with HR to 161.  NP and RN notified of increased dizziness and increased HR as well as c/o urinary urgency    Pertinent  Vitals/ Pain       Pain Assessment: Faces Pain Score: 5  Faces Pain Scale: Hurts even more Pain Location: L side of head Pain Descriptors / Indicators: Discomfort;Grimacing;Headache Pain Intervention(s): Monitored during session;Repositioned  Home Living                                          Prior Functioning/Environment              Frequency  Min 3X/week        Progress Toward Goals  OT Goals(current goals can now be found in the care plan section)  Progress towards OT goals: Progressing toward goals  Acute Rehab OT Goals Patient Stated Goal: to get back to playing tennis  Plan Discharge plan remains appropriate    Co-evaluation                 AM-PAC OT "6 Clicks" Daily Activity     Outcome Measure   Help from another person eating meals?: A Little Help from another person taking care of personal grooming?: A Little Help from another person toileting, which includes using toliet, bedpan, or urinal?: A Little Help from another person bathing (including washing, rinsing, drying)?: A Little Help from another person to put on and taking off regular upper body clothing?: A Little Help from another person to put on and taking off regular lower body clothing?: A Little 6 Click Score: 18    End of Session    OT Visit Diagnosis: Unsteadiness on feet (R26.81);Muscle weakness (generalized) (M62.81)   Activity Tolerance Patient tolerated treatment well   Patient Left in bed;with call bell/phone within reach;with family/visitor present;with nursing/sitter in room   Nurse Communication Mobility status        Time: 7253-6644 OT Time Calculation (min): 62 min  Charges: OT General Charges $OT Visit: 1 Visit OT Treatments $Self Care/Home Management : 53-67 mins  Nilsa Nutting OTR/L Acute Rehabilitation Services Pager [phone] Office [phone]    Lucille Passy M 10/23/2020, 6:53 PM

## 2020-10-24 LAB — COMPREHENSIVE METABOLIC PANEL
ALT: 66 U/L — ABNORMAL HIGH (ref 0–44)
AST: 20 U/L (ref 15–41)
Albumin: 3.1 g/dL — ABNORMAL LOW (ref 3.5–5.0)
Alkaline Phosphatase: 40 U/L (ref 38–126)
Anion gap: 7 (ref 5–15)
BUN: 13 mg/dL (ref 6–20)
CO2: 25 mmol/L (ref 22–32)
Calcium: 9.1 mg/dL (ref 8.9–10.3)
Chloride: 105 mmol/L (ref 98–111)
Creatinine, Ser: 0.84 mg/dL (ref 0.44–1.00)
GFR, Estimated: >60 mL/min (ref >60)
Glucose, Bld: 142 mg/dL — ABNORMAL HIGH (ref 70–99)
Potassium: 4.3 mmol/L (ref 3.5–5.1)
Sodium: 137 mmol/L (ref 135–145)
Total Bilirubin: 0.4 mg/dL (ref 0.3–1.2)
Total Protein: 5.5 g/dL — ABNORMAL LOW (ref 6.5–8.1)

## 2020-10-24 LAB — CBC
HCT: 30.8 % — ABNORMAL LOW (ref 36.0–46.0)
Hemoglobin: 10.6 g/dL — ABNORMAL LOW (ref 12.0–15.0)
MCH: 30.1 pg (ref 26.0–34.0)
MCHC: 34.4 g/dL (ref 30.0–36.0)
MCV: 87.5 fL (ref 80.0–100.0)
Platelets: 226 10*3/uL (ref 150–400)
RBC: 3.52 MIL/uL — ABNORMAL LOW (ref 3.87–5.11)
RDW: 12.9 % (ref 11.5–15.5)
WBC: 13.7 10*3/uL — ABNORMAL HIGH (ref 4.0–10.5)
nRBC: 0 % (ref 0.0–0.2)

## 2020-10-24 LAB — AMMONIA: Ammonia: 13 umol/L (ref 9–35)

## 2020-10-24 NOTE — Progress Notes (Signed)
Neurosurgery Service Progress Note  Subjective: No acute events overnight. Pt having some hallucinations preferentially in the left visual hemifield - seeing some sprites but mostly tribal masks and other fairly scary items   Objective: Vitals:   10/24/20 0530 10/24/20 0600 10/24/20 0700 10/24/20 0800  BP:  (!) 84/66 110/72 111/68  Pulse: 69 67 69 81  Resp: 18 15 13  (!) 40  Temp:    98.6 F (37 C)  TempSrc:    Oral  SpO2: 98% 97% 96% 100%  Weight:      Height:        Physical Exam: AOx3, PERRL, EOMI, FS, TM, Strength 5/5 x4, SILTx4, no drift Left eye swollen shut  Assessment & Plan: 50 y.o. woman s/p L crani rsxn likely meningioma, recovering well.  -d/c keppra, could be contributing to hallucinations -some paroxysmal tachycardia - could be from hallucinations but also while working w/ PT: rpt labs, EKG, CMP, CBC, ammonia  -okay to transfer to stepdown -will restart keppra if episodes worsen off AEDs -drain still putting out at a decent rate, will likely d/c tomorrow -SCDs/TEDs/SQH  Judith Part  10/24/20 10:28 AM

## 2020-10-24 NOTE — Progress Notes (Signed)
Occupational Therapy Treatment Patient Details Name: Lydia Jenkins MRN: [id number] DOB: December 07, 1970 Today's Date: 10/24/2020    History of present illness The pt is a 50 yo female L presenting 5/26 s/p frontal craniotomy for resection of meningioma. PMH includes: hypothyroidism, hiatal hernia, spincter of Oddi dysfunciton, depression, and anxiety.   OT comments  Pt is makine slow, but steady progress.  She is able to tolerate increased activity today, but continues to have dizziness with c/o spinning with associated HR increase to 175.   She continues to report hallucinations,but today is seeing tribal masks.  She was able to perform ADLs with min A today due to impaired balance.   Will continue to follow.   Follow Up Recommendations  Outpatient OT    Equipment Recommendations  None recommended by OT    Recommendations for Other Services      Precautions / Restrictions Precautions Precautions: Fall Precaution Comments: hemovac drain L side of skull , foley       Mobility Bed Mobility Overal bed mobility: Needs Assistance Bed Mobility: Supine to Sit Rolling: Min guard              Transfers Overall transfer level: Needs assistance Equipment used: 1 person hand held assist Transfers: Sit to/from [facility] Sit to Stand: Min guard Stand pivot transfers: Min guard       General transfer comment: assist for balance    Balance Overall balance assessment: Needs assistance Sitting-balance support: Feet supported Sitting balance-Leahy Scale: Good     Standing balance support: Single extremity supported;During functional activity Standing balance-Leahy Scale: Poor Standing balance comment: requires UE support                           ADL either performed or assessed with clinical judgement   ADL Overall ADL's : Needs assistance/impaired     Grooming: Wash/dry hands;Wash/dry face;Oral care;Minimal assistance;Standing Grooming Details  (indicate cue type and reason): assist for balance Upper Body Bathing: Supervision/ safety;Set up;Sitting   Lower Body Bathing: Minimal assistance;Sit to/from stand Lower Body Bathing Details (indicate cue type and reason): assist for balance Upper Body Dressing : Set up;Sitting   Lower Body Dressing: Minimal assistance;Sit to/from stand Lower Body Dressing Details (indicate cue type and reason): min A for balance Toilet Transfer: Minimal assistance;Ambulation;Comfort height toilet;Grab bars   Toileting- Clothing Manipulation and Hygiene: Minimal assistance;Sit to/from stand Toileting - Clothing Manipulation Details (indicate cue type and reason): min A for balance     Functional mobility during ADLs: Minimal assistance General ADL Comments: Pt with intermittent c/o dizziness     Vision   Additional Comments: Pt reports continued hallucinations, but today she is seeing tribal masks that are frightening   Perception     Praxis      Cognition Arousal/Alertness: Awake/alert Behavior During Therapy: Flat affect Overall Cognitive Status: Within Functional Limits for tasks assessed                                 General Comments: [facility] for basic tasks.  Will benefit from higher level cognitive assessment as she is able to tolerate increased activity        Exercises     Shoulder Instructions       General Comments HR 95 at rest, but increased to 120s with move to EOB.  with onset of dizziness, HR increased to 175 today.  Pt continues with urinary urgency.  MD and RN notified    Pertinent Vitals/ Pain       Pain Assessment: Faces Faces Pain Scale: Hurts even more Pain Location: L side of head Pain Descriptors / Indicators: Discomfort;Grimacing;Headache Pain Intervention(s): Monitored during session  Home Living                                          Prior Functioning/Environment              Frequency  Min 3X/week         Progress Toward Goals  OT Goals(current goals can now be found in the care plan section)  Progress towards OT goals: Progressing toward goals     Plan Discharge plan remains appropriate    Co-evaluation                 AM-PAC OT "6 Clicks" Daily Activity     Outcome Measure   Help from another person eating meals?: A Little Help from another person taking care of personal grooming?: A Little Help from another person toileting, which includes using toliet, bedpan, or urinal?: A Little Help from another person bathing (including washing, rinsing, drying)?: A Little Help from another person to put on and taking off regular upper body clothing?: A Little Help from another person to put on and taking off regular lower body clothing?: A Little 6 Click Score: 18    End of Session    OT Visit Diagnosis: Unsteadiness on feet (R26.81);Muscle weakness (generalized) (M62.81)   Activity Tolerance Treatment limited secondary to medical complications (Comment)   Patient Left with call bell/phone within reach;in chair   Nurse Communication Mobility status;Other (comment) (elevated HR, urinary urgency and incontinence, and hallucinations)        Time: 1025-8527 OT Time Calculation (min): 50 min  Charges: OT General Charges $OT Visit: 1 Visit OT Treatments $Self Care/Home Management : 38-52 mins  Nilsa Nutting., OTR/L Acute Rehabilitation Services Pager [phone] Office [phone]    Lucille Passy M 10/24/2020, 10:47 AM

## 2020-10-25 NOTE — Progress Notes (Signed)
Physical Therapy Treatment Patient Details Name: Lydia Jenkins MRN: [id number] DOB: 10-25-1970 Today's Date: 10/25/2020    History of Present Illness The pt is a 50 yo female L presenting 5/26 s/p frontal craniotomy for resection of meningioma. PMH includes: hypothyroidism, hiatal hernia, spincter of Oddi dysfunciton, depression, and anxiety.    PT Comments    The pt was eager to participate in PT session this afternoon, reports reduced feeling of nausea and headache at this time. The pt was able to demo great progress with activity tolerance and mobility this session as HR remained mostly stable in the 120s-130s with OOB mobility and hallway mobility with use of RW. The pt was able to demo good stability with BUE support on RW, and was able to adopt cues for posture, stride length, and reduced tension in neck/shoulders with gait. The pt still reports some dizziness and nausea with initial changes in position, but was able to maintain static sitting and static standing balance with minG only and no UE support at this time. Pt with improved opening of L eye this session, reporting diplopia and blurry vision that impacts safety and navigation with mobility. The pt will continue to benefit from skilled PT acutely to progress functional stability and endurance prior to return home.    Follow Up Recommendations  Outpatient PT     Equipment Recommendations  Rolling walker with 5" wheels    Recommendations for Other Services       Precautions / Restrictions Precautions Precautions: Fall Precaution Comments: hemovac drain L side of skull Restrictions Weight Bearing Restrictions: No    Mobility  Bed Mobility Overal bed mobility: Needs Assistance Bed Mobility: Supine to Sit Rolling: Supervision   Supine to sit: Supervision Sit to supine: Supervision   General bed mobility comments: supervision for lines, increased time but no assist needed    Transfers Overall transfer level: Needs  assistance Equipment used: Rolling walker (2 wheeled) Transfers: Sit to/from Stand Sit to Stand: Supervision         General transfer comment: supervision and VC for hand placement. no physical assistq  Ambulation/Gait Ambulation/Gait assistance: Min guard Gait Distance (Feet): 75 Feet Assistive device: Rolling walker (2 wheeled) Gait Pattern/deviations: Step-through pattern;Decreased stride length;Step-to pattern;Trunk flexed;Narrow base of support Gait velocity: 0.18 m/s Gait velocity interpretation: <1.31 ft/sec, indicative of household ambulator General Gait Details: small steps, able to increase slightly when cued. cues to relax at shoulders. HR max 140 bpm but mostly sustaining 120-130         Balance Overall balance assessment: Needs assistance Sitting-balance support: Feet supported Sitting balance-Leahy Scale: Good Sitting balance - Comments: able to adjust socks without LOB   Standing balance support: Single extremity supported;During functional activity Standing balance-Leahy Scale: Poor Standing balance comment: requires UE support for dynamic movement, no UE supported in short bouts with static standing                            Cognition Arousal/Alertness: Awake/alert Behavior During Therapy: WFL for tasks assessed/performed Overall Cognitive Status: Within Functional Limits for tasks assessed                                 General Comments: WFL during session. pt had no hallucinations during session, but reports they have still been happening during the day      Exercises      General  Comments General comments (skin integrity, edema, etc.): HR from 75 bpm at rest to max of 140bpm. mostly sustaining 120-130 this session      Pertinent Vitals/Pain Pain Assessment: Faces Faces Pain Scale: Hurts a little bit Pain Location: L side of head Pain Descriptors / Indicators: Discomfort;Grimacing;Headache Pain Intervention(s):  Limited activity within patient's tolerance;Monitored during session;Repositioned           PT Goals (current goals can now be found in the care plan section) Acute Rehab PT Goals Patient Stated Goal: to get back to playing tennis PT Goal Formulation: With patient Time For Goal Achievement: 11/05/20 Potential to Achieve Goals: Good Progress towards PT goals: Progressing toward goals    Frequency    Min 4X/week      PT Plan Current plan remains appropriate       AM-PAC PT "6 Clicks" Mobility   Outcome Measure  Help needed turning from your back to your side while in a flat bed without using bedrails?: A Little Help needed moving from lying on your back to sitting on the side of a flat bed without using bedrails?: A Little Help needed moving to and from a bed to a chair (including a wheelchair)?: A Little Help needed standing up from a chair using your arms (e.g., wheelchair or bedside chair)?: A Little Help needed to walk in hospital room?: A Little Help needed climbing 3-5 steps with a railing? : A Lot 6 Click Score: 17    End of Session Equipment Utilized During Treatment: Gait belt Activity Tolerance: Patient tolerated treatment well Patient left: in bed;with call bell/phone within reach;with family/visitor present Nurse Communication: Mobility status PT Visit Diagnosis: Other abnormalities of gait and mobility (R26.89);Pain     Time: 1517-1550 PT Time Calculation (min) (ACUTE ONLY): 33 min  Charges:  $Gait Training: 8-22 mins $Therapeutic Activity: 8-22 mins                     Lydia Jenkins, PT, DPT   Acute Rehabilitation Department Pager #: [phone]   Otho Bellows 10/25/2020, 4:00 PM

## 2020-10-25 NOTE — Progress Notes (Signed)
Occupational Therapy Treatment Patient Details Name: Lydia Jenkins MRN: [id number] DOB: 05-03-1971 Today's Date: 10/25/2020    History of present illness The pt is a 50 yo female L presenting 5/26 s/p frontal craniotomy for resection of meningioma. PMH includes: hypothyroidism, hiatal hernia, spincter of Oddi dysfunciton, depression, and anxiety.   OT comments  Pt now able to open Lt eye, but demonstrates diplopia.  Rt lens of glasses nasally occluded to reduce diplopia while allowing both eyes to remain open and work binocularly.  She and daughter were instructed how to occlude her glasses when family brings them in.   Follow Up Recommendations  Outpatient OT    Equipment Recommendations  None recommended by OT    Recommendations for Other Services      Precautions / Restrictions Precautions Precautions: Fall Precaution Comments: hemovac drain L side of skull Restrictions Weight Bearing Restrictions: No       Mobility Bed Mobility Overal bed mobility: Needs Assistance Bed Mobility: Supine to Sit Rolling: Supervision   Supine to sit: Supervision Sit to supine: Supervision   General bed mobility comments: supervision for lines, increased time but no assist needed    Transfers Overall transfer level: Needs assistance Equipment used: Rolling walker (2 wheeled) Transfers: Sit to/from Stand Sit to Stand: Supervision         General transfer comment: supervision and VC for hand placement. no physical assistq    Balance Overall balance assessment: Needs assistance Sitting-balance support: Feet supported Sitting balance-Leahy Scale: Good Sitting balance - Comments: able to adjust socks without LOB   Standing balance support: Single extremity supported;During functional activity Standing balance-Leahy Scale: Poor Standing balance comment: requires UE support for dynamic movement, no UE supported in short bouts with static standing                            ADL either performed or assessed with clinical judgement   ADL                                               Vision   Additional Comments: Lt eye now opening due to decreased edema.   She know reports vertical diplopia with gaze in all directions.  She wears glasses, but does not have them with her.  Rt nasal lens of clear lensed glasses occluded to reduce diplopia.  She reports diplopia with only far right gaze with the glasses on.  Instructed she and daughter how to occlude the lenses on her glasses   Perception     Praxis      Cognition Arousal/Alertness: Awake/alert Behavior During Therapy: WFL for tasks assessed/performed Overall Cognitive Status: Within Functional Limits for tasks assessed                                 General Comments: WFL during session. pt had no hallucinations during session, but reports they have still been happening during the day        Exercises     Shoulder Instructions       General Comments HR from 75 bpm at rest to max of 140bpm. mostly sustaining 120-130 this session    Pertinent Vitals/ Pain       Pain Assessment: Faces Faces Pain Scale: Hurts a  little bit Pain Location: L side of head Pain Descriptors / Indicators: Discomfort;Grimacing;Headache Pain Intervention(s): Monitored during session  Home Living                                          Prior Functioning/Environment              Frequency  Min 3X/week        Progress Toward Goals  OT Goals(current goals can now be found in the care plan section)  Progress towards OT goals: Progressing toward goals  Acute Rehab OT Goals Patient Stated Goal: to get back to playing tennis  Plan Discharge plan remains appropriate    Co-evaluation                 AM-PAC OT "6 Clicks" Daily Activity     Outcome Measure   Help from another person eating meals?: None Help from another person taking care of  personal grooming?: A Little Help from another person toileting, which includes using toliet, bedpan, or urinal?: A Little Help from another person bathing (including washing, rinsing, drying)?: A Little Help from another person to put on and taking off regular upper body clothing?: A Little Help from another person to put on and taking off regular lower body clothing?: A Little 6 Click Score: 19    End of Session    OT Visit Diagnosis: Unsteadiness on feet (R26.81);Muscle weakness (generalized) (M62.81)   Activity Tolerance Patient tolerated treatment well   Patient Left in bed;with bed alarm set;with family/visitor present   Nurse Communication Mobility status        Time: 5956-3875 OT Time Calculation (min): 21 min  Charges: OT General Charges $OT Visit: 1 Visit OT Treatments $Therapeutic Activity: 8-22 mins  Nilsa Nutting., OTR/L Acute Rehabilitation Services Pager [phone] Office [phone]    Lucille Passy M 10/25/2020, 6:11 PM

## 2020-10-26 ENCOUNTER — Other Ambulatory Visit: Payer: Self-pay | Admitting: Radiation Therapy

## 2020-10-26 MED ORDER — ACETAMINOPHEN-CODEINE #3 300-30 MG PO TABS
1.0000 | ORAL_TABLET | Freq: Four times a day (QID) | ORAL | Status: DC | PRN
  Administered 2020-10-26: 2 via ORAL
  Filled 2020-10-26: qty 2

## 2020-10-26 MED ORDER — DOCUSATE SODIUM 100 MG PO CAPS: 100.0000 mg | ORAL_CAPSULE | Freq: Two times a day (BID) | ORAL | 2 refills | Status: AC

## 2020-10-26 MED ORDER — LORAZEPAM 0.5 MG PO TABS: 0.5000 mg | ORAL_TABLET | Freq: Three times a day (TID) | ORAL | 0 refills | Status: AC | PRN

## 2020-10-26 MED ORDER — DEXAMETHASONE 2 MG PO TABS: ORAL_TABLET | ORAL | 0 refills | Status: AC

## 2020-10-26 MED ORDER — ACETAMINOPHEN-CODEINE #3 300-30 MG PO TABS: 1.0000 | ORAL_TABLET | Freq: Four times a day (QID) | ORAL | 0 refills | Status: DC | PRN

## 2020-10-26 NOTE — Progress Notes (Signed)
Neurosurgery Service Progress Note  Subjective: No acute events overnight.  Objective: Vitals:   10/25/20 1525 10/25/20 2011 10/25/20 2222 10/25/20 2318  BP: (!) 120/51 (!) 101/58 123/69 102/61  Pulse: 94 92  62  Resp: 19 15  17   Temp: 98.2 F (36.8 C) 98.2 F (36.8 C)  97.8 F (36.6 C)  TempSrc: Oral Oral  Oral  SpO2: 93% 93%  97%  Weight:      Height:        Physical Exam: AOx3, PERRL, EOMI, FS, TM, Strength 5/5 x4, SILTx4, no drift Left eye swollen shut  Assessment & Plan: 50 y.o. woman s/p L crani rsxn likely meningioma, recovering well.  -d/c'd keppra, could be contributing to hallucinations -some paroxysmal tachycardia - improved, labs unremarkable -d/c drain in AM -SCDs/TEDs/SQH  Ady Heimann A Aryanna Shaver  10/26/20 12:49 AM

## 2020-10-26 NOTE — Progress Notes (Signed)
Physical Therapy Treatment Patient Details Name: Lydia Jenkins MRN: [id number] DOB: May 23, 1971 Today's Date: 10/26/2020    History of Present Illness The pt is a 50 yo female L presenting 5/26 s/p frontal craniotomy for resection of meningioma. PMH includes: hypothyroidism, hiatal hernia, spincter of Oddi dysfunciton, depression, and anxiety.    PT Comments    Pt crying "happy tears" about getting to potentially leave today upon PT arrival. Pt reports buttocks and posterior thigh discomfort, and demonstrates tightness of bilateral glutes and hamstrings on exam, PT taught pt series of stretches to address areas of discomfort which pt tolerated well. Pt ambulatory in hallway with RW, occasionally requiring light assist to steady and guide pt trajectory. Pt reports "it looks like we're walking downhill" and "it's like we're on a ramp" several times throughout session, due to visual changes. Pt wearing occlusion glasses during session, which pt reports help, but PT encouraged pt to have family member with her to assist as needed whenever she is mobilizing. Pt expresses understanding. Pt with periods of central dizziness intermittently throughout session, passes <1 minute and pt is good at regulating rest to decrease dizziness. Will continue to follow.    Follow Up Recommendations  Outpatient PT     Equipment Recommendations  Rolling walker with 5" wheels    Recommendations for Other Services       Precautions / Restrictions Precautions Precautions: Fall Restrictions Weight Bearing Restrictions: No    Mobility  Bed Mobility Overal bed mobility: Needs Assistance Bed Mobility: Supine to Sit;Sit to Supine Rolling: Min guard   Supine to sit: Min guard     General bed mobility comments: close guard for safety    Transfers Overall transfer level: Needs assistance Equipment used: Rolling walker (2 wheeled) Transfers: Sit to/from Stand Sit to Stand: Supervision         General  transfer comment: for safety, slow to rise and steady. Pt also requiring increased time to transition from stand>sit, given feeling of central dizziness/unsteadiness  Ambulation/Gait Ambulation/Gait assistance: Min guard;Min assist Gait Distance (Feet): 120 Feet Assistive device: Rolling walker (2 wheeled) Gait Pattern/deviations: Step-through pattern;Decreased stride length;Trunk flexed;Narrow base of support Gait velocity: decr   General Gait Details: Min guard for safety, occasional min assist to steady or direct RW around obstacles given pt depth perception and visual deficits. Verbal cuing for upright posture and shoulder depression.   Stairs             Wheelchair Mobility    Modified Rankin (Stroke Patients Only)       Balance Overall balance assessment: Needs assistance Sitting-balance support: Feet supported Sitting balance-Leahy Scale: Good     Standing balance support: Single extremity supported;During functional activity Standing balance-Leahy Scale: Poor                              Cognition Arousal/Alertness: Awake/alert Behavior During Therapy: WFL for tasks assessed/performed Overall Cognitive Status: Within Functional Limits for tasks assessed                                 General Comments: tearful at start of session, emotional due to getting to go home today per pt report.      Exercises Other Exercises Other Exercises: Figure 4 stretch 2x30 seconds bilaterally, piriformis stretch 1x30 seconds bilaterally, lying 90/90 hamstring stretch with gait belt loop around foot 1x30 seconds  bilaterally    General Comments General comments (skin integrity, edema, etc.): HR 119-156 bpm, RN aware      Pertinent Vitals/Pain Pain Assessment: Faces Faces Pain Scale: Hurts little more Pain Location: bilat hips and posterior thighs Pain Descriptors / Indicators: Discomfort;Tightness;Grimacing (dizziness-induced nausea  discomfort) Pain Intervention(s): Monitored during session;Repositioned    Home Living                      Prior Function            PT Goals (current goals can now be found in the care plan section) Acute Rehab PT Goals Patient Stated Goal: to get back to playing tennis PT Goal Formulation: With patient Time For Goal Achievement: 11/05/20 Potential to Achieve Goals: Good Progress towards PT goals: Progressing toward goals    Frequency    Min 4X/week      PT Plan Current plan remains appropriate    Co-evaluation              AM-PAC PT "6 Clicks" Mobility   Outcome Measure  Help needed turning from your back to your side while in a flat bed without using bedrails?: A Little Help needed moving from lying on your back to sitting on the side of a flat bed without using bedrails?: A Little Help needed moving to and from a bed to a chair (including a wheelchair)?: A Little Help needed standing up from a chair using your arms (e.g., wheelchair or bedside chair)?: A Little Help needed to walk in hospital room?: A Little Help needed climbing 3-5 steps with a railing? : A Lot 6 Click Score: 17    End of Session   Activity Tolerance: Patient tolerated treatment well Patient left: in bed;with call bell/phone within reach Nurse Communication: Mobility status PT Visit Diagnosis: Other abnormalities of gait and mobility (R26.89);Pain     Time: 6269-4854 PT Time Calculation (min) (ACUTE ONLY): 36 min  Charges:  $Gait Training: 8-22 mins $Therapeutic Exercise: 8-22 mins                     Stacie Glaze, PT DPT Acute Rehabilitation Services Pager [phone]  Office [phone]    [place] 10/26/2020, 2:14 PM

## 2020-10-26 NOTE — Discharge Summary (Signed)
  Physician Discharge Summary  Patient ID: Lydia Jenkins MRN: [id number] DOB/AGE: 52-12-1970 52 y.o.  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 female who was admitted after resection of a large left frontal extra-axial mass.  She was admitted to the ICU postoperatively.  A CT head performed on postoperative day #1 showed expected postoperative changes with resolution of the severe mass-effect from the tumor.  A postoperative day #2 MRI showed a gross total resection.  She was mobilized with physical therapy and Occupational Therapy.  She had a history of difficulty tolerating opioid medications with her sphincter of Oddi dysfunction, but she tolerated fentanyl well for pain control.  She was contending with some significant headaches, as well as some occasional hallucinations and anxiety, possibly from steroid and Keppra.  Her Keppra was stopped.  She was downgraded to the floor on 5/30, Her subgaleal drain was removed on 5/31,  and was deemed ready for discharge home on 5/31.  Pathology is pending.  Treatments: Surgery -left frontal craniotomy for resection of large extra-axial mass Discharge Exam: Blood pressure 110/71, pulse 71, temperature 98.4 F (36.9 C), resp. rate 17, height 5\' 4"  (1.626 m), weight 68 kg, SpO2 98 %. Awake, alert, oriented Speech fluent, appropriate CN grossly intact 5/5 BUE/BLE Wound c/d/i Slight change in taste per patient  Disposition: Discharge disposition: 01-Home or Self Care          Follow-up Information    Vallarie Mare, MD. Schedule an appointment as soon as possible for a visit in 2 week(s).   Specialty: Neurosurgery Contact information: [address] [place]  [address] [phone]               Signed: Vallarie Mare 10/26/2020, 10:14 AM

## 2020-10-26 NOTE — Discharge Instructions (Signed)
Okay to shower with baby shampoo Do not drive until cleared at next clinic visit Avoid strenuous activity, no lifting greater than 15 lbs

## 2020-10-26 NOTE — Addendum Note (Signed)
Addendum  created 10/26/20 0733 by Wilburn Cornelia, CRNA   Order list changed

## 2020-10-26 NOTE — Care Management (Signed)
Spoke w patient at bedside. Referral made to outpatient PT OT at [facility], and RW to be delivere dto her room prior to DC. He spouse will provide transport

## 2020-10-28 LAB — SURGICAL PATHOLOGY

## 2020-11-03 ENCOUNTER — Ambulatory Visit: Payer: 59 | Admitting: Occupational Therapy

## 2020-11-03 ENCOUNTER — Other Ambulatory Visit: Payer: Self-pay

## 2020-11-03 ENCOUNTER — Ambulatory Visit: Payer: 59 | Attending: Family Medicine | Admitting: Physical Therapy

## 2020-11-03 ENCOUNTER — Encounter: Payer: Self-pay | Admitting: Occupational Therapy

## 2020-11-03 ENCOUNTER — Encounter: Payer: Self-pay | Admitting: Physical Therapy

## 2020-11-03 VITALS — BP 100/71 | HR 87

## 2020-11-03 DIAGNOSIS — R42 Dizziness and giddiness: Secondary | ICD-10-CM

## 2020-11-03 DIAGNOSIS — R2681 Unsteadiness on feet: Secondary | ICD-10-CM

## 2020-11-03 DIAGNOSIS — R2689 Other abnormalities of gait and mobility: Secondary | ICD-10-CM

## 2020-11-03 DIAGNOSIS — R41840 Attention and concentration deficit: Secondary | ICD-10-CM | POA: Diagnosis present

## 2020-11-03 DIAGNOSIS — R41842 Visuospatial deficit: Secondary | ICD-10-CM | POA: Insufficient documentation

## 2020-11-03 DIAGNOSIS — R29818 Other symptoms and signs involving the nervous system: Secondary | ICD-10-CM

## 2020-11-04 NOTE — Therapy (Addendum)
[facility] [address] [place], [place], [address] Phone: [phone]   Fax:  [phone]  Physical Therapy Evaluation  Patient Details  Name: Lydia Jenkins MRN: [id number] Date of Birth: June 28, 1970 Referring Provider (PT): Vallarie Mare, MD   Encounter Date: 11/03/2020   PT End of Session - 11/04/20 2027     Visit Number 1    Number of Visits 17    Date for PT Re-Evaluation 01/03/21    Authorization Type UHC [id number]    PT Start Time 1619    PT Stop Time 1703    PT Time Calculation (min) 44 min    Equipment Utilized During Treatment Gait belt    Activity Tolerance Patient tolerated treatment well    Behavior During Therapy WFL for tasks assessed/performed             Past Medical History:  Diagnosis Date   Abnormal Pap smear    Anemia    iron deficient   Anxiety    Breast disorder    precencerous lesion, marker placed   Complication of anesthesia    Depression    Family history of adverse reaction to anesthesia    Hiatal hernia    Hypothyroidism    Liver dysfunction    Menorrhagia    Migraine headache with aura    PONV (postoperative nausea and vomiting)     Past Surgical History:  Procedure Laterality Date   ABDOMINOPLASTY  7591   APPLICATION OF CRANIAL NAVIGATION N/A 10/21/2020   Procedure: APPLICATION OF CRANIAL NAVIGATION;  Surgeon: Vallarie Mare, MD;  Location: [facility];  Service: Neurosurgery;  Laterality: N/A;   AUGMENTATION MAMMAPLASTY Bilateral 2009   BILIARY STENT PLACEMENT N/A 12/10/2017   Procedure: BILIARY STENT PLACEMENT;  Surgeon: Carol Ada, MD;  Location: [facility];  Service: Endoscopy;  Laterality: N/A;  plastic 8.5 x 5 stent placed   BREAST BIOPSY Left 2010   precancerous bx   CRANIOTOMY Left 10/21/2020   Procedure: LEFT FRONTAL CRANIOTOMY FOR RESECTION OF MENINGIOMA;  Surgeon: Vallarie Mare, MD;  Location: [facility];  Service: Neurosurgery;  Laterality: Left;    CRYOTHERAPY     cervix-3x   ENDOMETRIAL ABLATION  2010   ERCP N/A 12/10/2017   Procedure: ENDOSCOPIC RETROGRADE CHOLANGIOPANCREATOGRAPHY (ERCP);  Surgeon: Carol Ada, MD;  Location: [facility];  Service: Endoscopy;  Laterality: N/A;  Balloon sweep of duct   LAPAROSCOPIC CHOLECYSTECTOMY  2008   SPHINCTEROTOMY  12/10/2017   Procedure: SPHINCTEROTOMY;  Surgeon: Carol Ada, MD;  Location: [facility];  Service: Endoscopy;;   TUBAL LIGATION  1998    Vitals:   11/03/20 1635  BP: 100/71  Pulse: 87  SpO2: 100%      Subjective Assessment - 11/03/20 1621     Subjective Pt went to the ER on 5/18 due to intermittent headaches and had severe nausea. Found to have a large 6.5 cm left frontal extra-axial mass consistent with meningioma with significant mass-effect. On 5/26 underwent s/p frontal craniotomy for resection of meningioma. Discharged home on 10/26/20. Now is walking with a RW. Reports that hamstrings have gotten very tight. Has been very active. Hasn't been sleeping much. L eye gives her double vision that makes her feel really dizziness and then gets nauseous (keeps that eye closed). Can see pretty well with the R eye - can see close up vs. far away. Gets motion sickness (had a hx of this too even before the tumor). Saw the doctor on  monday - reports that tumor was benign, and will have a follow up MRI in 4 months.    Pertinent History hypothyroidism, hiatal hernia, spincter of Oddi dysfunciton, depression, and anxiety.    Limitations Walking    Patient Stated Goals wants to be back normal (if not better) - played tennis 3-5x a week and was doing crossfit, likes hiking    Currently in Pain? Yes    Pain Score 2     Pain Location Head   forehead   Pain Orientation Anterior    Pain Descriptors / Indicators Headache;Pressure    Pain Type Surgical pain                [facility] PT Assessment - 11/03/20 1642       Assessment   Medical Diagnosis s/p frontal craniotomy for resection  of meningioma.    Referring Provider (PT) Vallarie Mare, MD    Onset Date/Surgical Date 10/21/20    Hand Dominance Right    Prior Therapy PT and OT in hospital (acute)      Precautions   Precautions Fall    Precaution Comments no driving      Balance Screen   Has the patient fallen in the past 6 months No    Has the patient had a decrease in activity level because of a fear of falling?  No    Is the patient reluctant to leave their home because of a fear of falling?  No      Home Social worker Private residence    Living Arrangements Spouse/significant other;Children    Type of [place] to enter    Entrance Stairs-Number of Steps 3    Entrance Stairs-Rails None   holds hand of family to go up   [place];Able to live on main level with bedroom/bathroom    Alternate Level Stairs-Number of Steps 8   then landing, and another 8 going up   Alternate Level Stairs-Rails Right    Home Equipment Walker - standard;Shower seat   has a shower stool   Additional Comments daughter/husband there for supervision, otherwise trying to do everything on her own, showering on her own with shower chair      Prior Function   Level of Independence Independent    Leisure playing tennis, pickleball, hiking, reading      Cognition   Overall Cognitive Status --   reports slower with certain words and word finding. not remembering some things that she has said   Memory --   reports STM is impaired     Observation/Other Assessments   Observations pt keeps L eyes closed most of the time due to diplopia and incr dizziness with eyes open      Sensation   Light Touch Appears Intact      Coordination   Gross Motor Movements are Fluid and Coordinated Yes      ROM / Strength   AROM / PROM / Strength Strength      Strength   Strength Assessment Site Hip;Knee;Ankle    Right/Left Hip Left;Right    Right Hip Flexion 5/5    Left Hip Flexion 5/5     Right/Left Knee Right;Left    Right Knee Flexion 5/5    Right Knee Extension 5/5    Left Knee Flexion 5/5    Left Knee Extension 5/5    Right/Left Ankle Left;Right    Right Ankle Dorsiflexion 4+/5  Left Ankle Dorsiflexion 5/5      Transfers   Transfers Sit to Stand;Stand to Sit    Sit to Stand 5: Supervision    Five time sit to stand comments  12.65 seconds with no UE support    Stand to Sit 5: Supervision      Ambulation/Gait   Ambulation/Gait Yes    Ambulation/Gait Assistance 5: Supervision    Assistive device Rolling walker    Gait Pattern Step-through pattern    Ambulation Surface Level;Indoor    Gait velocity 17.81 seconds = 1.84 ft/sec      Standardized Balance Assessment   Standardized Balance Assessment Timed Up and Go Test      Timed Up and Go Test   Normal TUG (seconds) 9.41    TUG Comments no AD, slightly slower when turning but no LOB                        Objective measurements completed on examination: See above findings.               PT Education - 11/04/20 2026     Education Details clinical findings, POC, will send referral for speech therapy due to pt reporting difficulties with word finding/ STM    Person(s) Educated Patient    Methods Explanation    Comprehension Verbalized understanding                PT Short Term Goals - 11/05/20 0835       PT SHORT TERM GOAL #1   Title Pt will be independent with initial HEP in order to build upon functional gains made in therapy. ALL STGS DUE 12/03/20.    Time 4    Period Weeks    Status New    Target Date 12/03/20      PT SHORT TERM GOAL #2   Title Pt will undego further vestibular assessment and mCTSIB with STG/LTG written as appropriate.    Baseline not yet assessed - due to time constraints during eval.    Time 4    Period Weeks    Status New      PT SHORT TERM GOAL #3   Title Pt will undergo further testing of BERG with STG/LTG written as appropriate.     Time 4    Period Weeks    Status New      PT SHORT TERM GOAL #4   Title Pt will ambulate at least 88' with LRAD with supervision over indoor level surfaces in order to demo improved household mobility.    Time 4    Period Weeks    Status New      PT SHORT TERM GOAL #5   Title Pt will improve gait speed to at least 2.2 ft/sec with RW vs. LRAD in order to demo improved community mobility.    Baseline 1.84 ft/sec with RW.    Time 4    Period Weeks    Status New             PT Long Term Goals - 11/05/20 0839       PT LONG TERM GOAL #1   Title BERG goal/higher level balance test goal to be written as appropriate. ALL LTGS DUE 12/31/20    Time 8    Period Weeks    Status New    Target Date 12/31/20      PT LONG TERM GOAL #2   Title Pt will  improve gait speed to at least 2.8 ft/sec with LRAD vs. no AD in order to demo improved community mobility.    Baseline 1.84 ft/sec with RW    Time 8    Period Weeks    Status New      PT LONG TERM GOAL #3   Title Pt will ambulate at least 500' outdoors over paved/unlevel surfaces with supervision and LRAD/no AD in order to demo improved community mobility.    Time 8    Period Weeks    Status New      PT LONG TERM GOAL #4   Title Vestibular/mCTSIB goal to be written as appropriate.    Baseline not yet assessed.    Time 8    Period Weeks    Status New      PT LONG TERM GOAL #5   Title Pt will perform 16 steps using single handrail and step through pattern with supervision in order to be able to go up to the 2nd floor of her house.    Baseline unable to go upstairs    Time 8    Period Weeks    Status New                11/05/20 2993  Plan  Clinical Impression Statement Patient is a 50 year old female referred to Neuro OPPT s/p meningioma resection (5/26) Pt's PMH is significant for: hypothyroidism, hiatal hernia, spincter of Oddi dysfunciton, depression, and anxiety. Prior to hospitalization, pt was very active - played  tennis and went to [employer]. Pt now ambulating with a RW. The following deficits were present during the exam: impaired vision, decr strength, gait abnormalities, balance impairments, dizziness. Based on gait speed with RW, pt is a limited community ambulator. Due to time constraints of eval unable to perform further balance assessment (BERG)- will assess at next session.  Pt would benefit from skilled PT to address these impairments and functional limitations to maximize functional mobility independence  Personal Factors and Comorbidities Comorbidity 3+;Past/Current Experience  Comorbidities hypothyroidism, hiatal hernia, spincter of Oddi dysfunciton, depression, and anxiety.  Examination-Activity Limitations Stairs;Transfers;Squat;Locomotion Level  Examination-Participation Restrictions Community Activity;Driving;Cleaning;Laundry (playing tennis)  Pt will benefit from skilled therapeutic intervention in order to improve on the following deficits Abnormal gait;Decreased activity tolerance;Decreased balance;Dizziness;Decreased strength;Impaired vision/preception;Decreased endurance  Stability/Clinical Decision Making Evolving/Moderate complexity  Clinical Decision Making Moderate  Rehab Potential Good  PT Frequency 2x / week  PT Duration 8 weeks  PT Treatment/Interventions ADLs/Self Care Home Management;Stair training;Gait training;DME Instruction;Functional mobility training;Therapeutic activities;Therapeutic exercise;Neuromuscular re-education;Balance training;Patient/family education;Vestibular;Visual/perceptual remediation/compensation  PT Next Visit Plan perform BERG and vestibular assessment (may want to try MSQ), mCTSIB, initial HEP for balance and functional strengthening.  Recommended Other Services speech therapy eval - sent referral request/  Consulted and Agree with Plan of Care Patient            Patient will benefit from skilled therapeutic intervention in order to improve the  following deficits and impairments:     Visit Diagnosis: Unsteadiness on feet  Dizziness and giddiness  Other abnormalities of gait and mobility  Other symptoms and signs involving the nervous system     Problem List Patient Active Problem List   Diagnosis Date Noted   Brain mass 10/21/2020   S/P resection of meningioma 10/21/2020   Chronic headaches 10/14/2020   Frontal mass of brain 10/13/2020   Elevated LFTs    Dilated cbd, acquired    Abdominal pain 12/06/2017   Hypothyroidism 12/06/2017  Night sweats 03/11/2015   S/P endometrial ablation 03/12/2013   Premenstrual syndrome 03/12/2013   Anemia 08/14/2012   Pelvic pain in female 08/13/2012   Menorrhagia     Arliss Journey, PT, DPT  11/04/2020, 8:29 PM  [facility] [address] [place], [place], [address] Phone: [phone]   Fax:  [phone]  Name: Lydia Jenkins MRN: [id number] Date of Birth: 05-03-1971

## 2020-11-05 ENCOUNTER — Telehealth: Payer: Self-pay | Admitting: Physical Therapy

## 2020-11-05 NOTE — Telephone Encounter (Signed)
Dr. Marcello Moores, Lydia Jenkins was evaluated by Physical Therapy on 11/03/20.  The patient would benefit from a speech therapy evaluation for difficulties with word finding and with short term memory impairments s/p meningioma resection.   If you agree, please place an order in [facility] workque in [facility] or fax the order to [phone]. Thank you, Janann August, PT, DPT 11/05/20 8:17 AM    [facility] [address] [place], [place]  [address] Phone:  [phone] Fax:  [phone]

## 2020-11-05 NOTE — Therapy (Signed)
[facility] [address] [place], [place], [address] Phone: [phone]   Fax:  [phone]  Occupational Therapy Evaluation  Patient Details  Name: Lydia Jenkins MRN: [id number] Date of Birth: 22-Nov-1970 Referring Provider (OT): Duffy Rhody, MD   Encounter Date: 11/03/2020      OT End of Session - 11/08/20 1103     Visit Number 1    Number of Visits 17    Date for OT Re-Evaluation 12/31/20   may discharge early based on progress   Authorization Type UHC    Authorization Time Period VL:MN No Auth    OT Start Time 1700    OT Stop Time 1745    OT Time Calculation (min) 45 min    Activity Tolerance Patient tolerated treatment well    Behavior During Therapy [facility] for tasks assessed/performed              Subjective Assessment - 11/08/20 1103     Subjective  Pt presents to Neuro OPOT s/p left frontal craniotomy for ressection of  meningioma. Pt reports being very active prior to this episode and wishes to get back to what she was doing before, if not better. Pt's biggest complaint is of diplopia. Pt mostly with left eye closed during evaluation.    Pertinent History sphincter of oddi dysfunction, hiatal hernia, hypothyroidism, depression, anxiety    Limitations no driving    Patient Stated Goals "get back to what I was before if not better"    Currently in Pain? Yes    Pain Score 1     Pain Location Head    Pain Orientation Anterior    Pain Descriptors / Indicators Pressure;Headache    Pain Type Surgical pain    Pain Frequency Intermittent               Past Medical History:  Diagnosis Date   Abnormal Pap smear    Anemia    iron deficient   Anxiety    Breast disorder    precencerous lesion, marker placed   Complication of anesthesia    Depression    Family history of adverse reaction to anesthesia    Hiatal hernia    Hypothyroidism    Liver dysfunction    Menorrhagia    Migraine  headache with aura    PONV (postoperative nausea and vomiting)     Past Surgical History:  Procedure Laterality Date   ABDOMINOPLASTY  5427   APPLICATION OF CRANIAL NAVIGATION N/A 10/21/2020   Procedure: APPLICATION OF CRANIAL NAVIGATION;  Surgeon: Vallarie Mare, MD;  Location: [facility];  Service: Neurosurgery;  Laterality: N/A;   AUGMENTATION MAMMAPLASTY Bilateral 2009   BILIARY STENT PLACEMENT N/A 12/10/2017   Procedure: BILIARY STENT PLACEMENT;  Surgeon: Carol Ada, MD;  Location: [facility];  Service: Endoscopy;  Laterality: N/A;  plastic 8.5 x 5 stent placed   BREAST BIOPSY Left 2010   precancerous bx   CRANIOTOMY Left 10/21/2020   Procedure: LEFT FRONTAL CRANIOTOMY FOR RESECTION OF MENINGIOMA;  Surgeon: Vallarie Mare, MD;  Location: [facility];  Service: Neurosurgery;  Laterality: Left;   CRYOTHERAPY     cervix-3x   ENDOMETRIAL ABLATION  2010   ERCP N/A 12/10/2017   Procedure: ENDOSCOPIC RETROGRADE CHOLANGIOPANCREATOGRAPHY (ERCP);  Surgeon: Carol Ada, MD;  Location: [facility];  Service: Endoscopy;  Laterality: N/A;  Balloon sweep of duct   LAPAROSCOPIC CHOLECYSTECTOMY  2008   SPHINCTEROTOMY  12/10/2017   Procedure: SPHINCTEROTOMY;  Surgeon:  Carol Ada, MD;  Location: [facility];  Service: Endoscopy;;   TUBAL LIGATION  1998    There were no vitals filed for this visit.      [facility] OT Assessment - 11/08/20 1102       Assessment   Medical Diagnosis s/p frontal craniotomy for meningioma ressection    Referring Provider (OT) Duffy Rhody, MD    Onset Date/Surgical Date 10/21/20    Hand Dominance Right    Prior Therapy Acute Therapy      Precautions   Precautions Fall    Precaution Comments no driving      Balance Screen   Has the patient fallen in the past 6 months No      Home  Environment   Family/patient expects to be discharged to: Private residence    Living Arrangements Spouse/significant other   + daughter, 4 cats   Available Help at  Discharge Family    Type of [place] Layout Multi-level   bed and bath on main   Bathroom Shower/Tub [place] seat;Walker - 2 wheels;Hand held shower head      Prior Function   Level of Independence Independent    Vocation Other (comment)   homemaker   Leisure tennis, crossfit, hiking, pickleball      ADL   ADL comments Pt is independent with all ADLs. Pt still has supevision for getting in and out of the shower. Pt reports difficulty with ADLs d/t vision only.      IADL   Shopping Needs to be accompanied on any shopping trip   over stimulation   [place] alone or with occasional assistance   has been doing laundry   Meal Prep Able to complete simple warm meal prep   boiling eggs, bagel, etc   Community Mobility Relies on family or friends for transportation    Medication Management Is responsible for taking medication in correct dosages at correct time    Financial Management Dependent   spouse was doing prior     Vision - History   Baseline Vision Wears glasses all the time   wore glasses and contacts prior to [place] to track stimulus in all quads without difficulty   single and binocularly   Saccades Undershoots   right eye only   Convergence Within functional limits    Visual Fields No apparent deficits    Diplopia Assessment Objects split on top of one another   nasal occlusion on right lens in acute, pt could not tolerate at eval for OPOT. will continue assess occlusion   Depth Perception impaired per pt report    Patient has diffculty with activities due to visual impairment Adjusting stove/washing machine dials;Chopping vegetables;Measuring ingredients;Writing checks;Balancing checkbook;Reading bills    Comment pt spent majority of evaluation with left eye closed and wearing glasses. upon assessment, right eye seems to  have the most difficulty with ocular movements      Sensation   Light Touch Appears Intact      Coordination   9 Hole Peg Test Right;Left    Right 9 Hole Peg Test 19.03    Left 9 Hole Peg Test 20.16      AROM   Overall AROM  Within functional limits for tasks performed      Strength   Overall Strength Within functional limits  for tasks performed      Hand Function   Right Hand Gross Grasp Functional    Right Hand Grip (lbs) 61.2    Left Hand Gross Grasp Functional    Left Hand Grip (lbs) 54.2                 OT Education - 11/05/20 1045     Education Details Pt was educated on role and purpose of OT    Person(s) Educated Patient    Methods Explanation    Comprehension Verbalized understanding              OT Short Term Goals - 11/05/20 1037       OT SHORT TERM GOAL #1   Title Pt will be independent with diplopia HEP    Time 4    Period Weeks    Status New    Target Date 12/03/20      OT SHORT TERM GOAL #2   Title Pt will perform environmental scanning with 90% Accuracy and no reports of diplopia with occlusion PRN.    Time 4    Period Weeks    Status New      OT SHORT TERM GOAL #3   Title Pt will be independent with occlusion schedule and recommendations per OT PRN    Time 4    Period Weeks    Status New      OT SHORT TERM GOAL #4   Title Pt will perform simple warm meal prep and/or light housekeeping with no reports of diplopia with occlusion PRN and with mod I.    Time 4    Period Weeks    Status New               OT Long Term Goals - 11/05/20 1042       OT LONG TERM GOAL #1   Title Pt will be independent with updated HEPs    Time 8    Period Weeks    Status New    Target Date 12/31/20      OT LONG TERM GOAL #2   Title Pt will perform environmental scanning with cognitive component with 95% accuracy or greater and with no reports of diplopia with occlusion only PRN.    Time 8    Period Weeks    Status New      OT LONG  TERM GOAL #3   Title Pt will perform mod complex meal prep (i.e. following instructions on side of rice) with mod I and no reports of diplopia with occlusion PRN    Time 8    Period Weeks    Status New      OT LONG TERM GOAL #4   Title Pt will tolerate visual activity with no occlusion for 5 minutes or greater with no reports of disturbance/dizziness.    Time 8    Period Weeks    Status New                Plan - 11/08/20 1105     Clinical Impression Statement Pt is a 50 year old that presents to Neuro OPOT s/p left frontal craniotomy for resection of meningioma. PMH significant for hypothyroidism, hiatal hernia, sphincter of oddi dysfunction, depression and anxiety. Pt presents with deficits and complaints of double vision particularly with midline and right gaze. Pt reports single vision with left gaze. Pt also experiencing some unsteadiness on feet and decreased independence with ADLs and IADLs d/t visual  deficits. Skilled occupational therapy is recommended to target listed areas of deficit and increase independence.    OT Occupational Profile and History Problem Focused Assessment - Including review of records relating to presenting problem    Occupational performance deficits (Please refer to evaluation for details): ADL's;IADL's    Body Structure / Function / Physical Skills Vision;Mobility;GMC;Strength;IADL;ADL;Decreased knowledge of use of DME    Rehab Potential Good    Clinical Decision Making Limited treatment options, no task modification necessary    Comorbidities Affecting Occupational Performance: None    Modification or Assistance to Complete Evaluation  No modification of tasks or assist necessary to complete eval    OT Frequency 2x / week    OT Duration 8 weeks   may d/c early and/or reduce to 1 x a week depending on progress.   OT Treatment/Interventions Self-care/ADL training;DME and/or AE instruction;Visual/perceptual remediation/compensation;Patient/family  education;Therapist, nutritional;Therapeutic exercise;Neuromuscular education;Therapeutic activities    Plan Diplopia HEP    Consulted and Agree with Plan of Care Patient                  Patient will benefit from skilled therapeutic intervention in order to improve the following deficits and impairments:   Body Structure / Function / Physical Skills: Vision, Mobility, GMC, Strength, IADL, ADL, Decreased knowledge of use of DME       Visit Diagnosis: Visuospatial deficit  Unsteadiness on feet  Attention and concentration deficit  Other abnormalities of gait and mobility    Problem List Patient Active Problem List   Diagnosis Date Noted   Brain mass 10/21/2020   S/P resection of meningioma 10/21/2020   Chronic headaches 10/14/2020   Frontal mass of brain 10/13/2020   Elevated LFTs    Dilated cbd, acquired    Abdominal pain 12/06/2017   Hypothyroidism 12/06/2017   Night sweats 03/11/2015   S/P endometrial ablation 03/12/2013   Premenstrual syndrome 03/12/2013   Anemia 08/14/2012   Pelvic pain in female 08/13/2012   Menorrhagia     Zachery Conch MOT, OTR/L  11/05/2020, 10:46 AM  [facility] [address] [place], [place], [address] Phone: [phone]   Fax:  [phone]  Name: Lydia Jenkins MRN: [id number] Date of Birth: 1970-09-04

## 2020-11-05 NOTE — Addendum Note (Signed)
Addended by: Arliss Journey on: 11/05/2020 09:59 AM   Modules accepted: Orders

## 2020-11-09 ENCOUNTER — Encounter: Payer: Self-pay | Admitting: Physical Therapy

## 2020-11-09 ENCOUNTER — Ambulatory Visit: Payer: 59 | Admitting: Physical Therapy

## 2020-11-09 ENCOUNTER — Other Ambulatory Visit: Payer: Self-pay

## 2020-11-09 VITALS — BP 109/76 | HR 101

## 2020-11-09 DIAGNOSIS — R2681 Unsteadiness on feet: Secondary | ICD-10-CM

## 2020-11-09 DIAGNOSIS — R42 Dizziness and giddiness: Secondary | ICD-10-CM

## 2020-11-09 DIAGNOSIS — R2689 Other abnormalities of gait and mobility: Secondary | ICD-10-CM

## 2020-11-09 DIAGNOSIS — R29818 Other symptoms and signs involving the nervous system: Secondary | ICD-10-CM

## 2020-11-09 NOTE — Therapy (Signed)
[facility] [address] [place], [place], [address] Phone: [phone]   Fax:  [phone]  Physical Therapy Treatment  Patient Details  Name: Lydia Jenkins MRN: [id number] Date of Birth: 02/22/1971 Referring Provider (PT): Vallarie Mare, MD   Encounter Date: 11/09/2020   PT End of Session - 11/09/20 1653     Visit Number 2    Number of Visits 17    Date for PT Re-Evaluation 01/03/21    Authorization Type UHC 2022    PT Start Time 5916    PT Stop Time 1400    PT Time Calculation (min) 43 min    Equipment Utilized During Treatment Gait belt    Activity Tolerance Patient tolerated treatment well    Behavior During Therapy WFL for tasks assessed/performed             Past Medical History:  Diagnosis Date   Abnormal Pap smear    Anemia    iron deficient   Anxiety    Breast disorder    precencerous lesion, marker placed   Complication of anesthesia    Depression    Family history of adverse reaction to anesthesia    Hiatal hernia    Hypothyroidism    Liver dysfunction    Menorrhagia    Migraine headache with aura    PONV (postoperative nausea and vomiting)     Past Surgical History:  Procedure Laterality Date   ABDOMINOPLASTY  3846   APPLICATION OF CRANIAL NAVIGATION N/A 10/21/2020   Procedure: APPLICATION OF CRANIAL NAVIGATION;  Surgeon: Vallarie Mare, MD;  Location: [facility];  Service: Neurosurgery;  Laterality: N/A;   AUGMENTATION MAMMAPLASTY Bilateral 2009   BILIARY STENT PLACEMENT N/A 12/10/2017   Procedure: BILIARY STENT PLACEMENT;  Surgeon: Carol [facility], MD;  Location: [facility];  Service: Endoscopy;  Laterality: N/A;  plastic 8.5 x 5 stent placed   BREAST BIOPSY Left 2010   precancerous bx   CRANIOTOMY Left 10/21/2020   Procedure: LEFT FRONTAL CRANIOTOMY FOR RESECTION OF MENINGIOMA;  Surgeon: Vallarie Mare, MD;  Location: [facility];  Service: Neurosurgery;  Laterality: Left;    CRYOTHERAPY     cervix-3x   ENDOMETRIAL ABLATION  2010   ERCP N/A 12/10/2017   Procedure: ENDOSCOPIC RETROGRADE CHOLANGIOPANCREATOGRAPHY (ERCP);  Surgeon: Carol [facility], MD;  Location: [facility];  Service: Endoscopy;  Laterality: N/A;  Balloon sweep of duct   LAPAROSCOPIC CHOLECYSTECTOMY  2008   SPHINCTEROTOMY  12/10/2017   Procedure: SPHINCTEROTOMY;  Surgeon: Carol Ada, MD;  Location: [facility];  Service: Endoscopy;;   TUBAL LIGATION  1998    Vitals:   11/09/20 1345 11/09/20 1351  BP: 115/83 109/76  Pulse: 89 (!) 101     Subjective Assessment - 11/09/20 1321     Subjective Reports that she can just be sitting in place and be dizzy, happening quite often. Getting more vision with her L eye and that is altering the balance and making her dizzy.    Pertinent History hypothyroidism, hiatal hernia, spincter of Oddi dysfunciton, depression, and anxiety.    Limitations Walking    Patient Stated Goals wants to be back normal (if not better) - played tennis 3-5x a week and was doing crossfit, likes hiking    Currently in Pain? No/denies                [facility] PT Assessment - 11/09/20 1322       Standardized Balance Assessment   Standardized Balance  Assessment Berg Balance Test      Berg Balance Test   Sit to Stand Able to stand without using hands and stabilize independently    Standing Unsupported Able to stand safely 2 minutes    Sitting with Back Unsupported but Feet Supported on Floor or Stool Able to sit safely and securely 2 minutes    Stand to Sit Sits safely with minimal use of hands    Transfers Able to transfer safely, minor use of hands    Standing Unsupported with Eyes Closed Able to stand 10 seconds safely    Standing Unsupported with Feet Together Able to place feet together independently and stand 1 minute safely    From Standing, Reach Forward with Outstretched Arm Can reach confidently >25 cm (10")    From Standing Position, Pick up Object from Floor Able  to pick up shoe safely and easily    From Standing Position, Turn to Look Behind Over each Shoulder Looks behind from both sides and weight shifts well   dizziness when turning to come back to midline   Turn 360 Degrees Able to turn 360 degrees safely but slowly   1-2/10 dizziness both directions   Standing Unsupported, Alternately Place Feet on Step/Stool Able to stand independently and safely and complete 8 steps in 20 seconds    Standing Unsupported, One Foot in Front Able to place foot tandem independently and hold 30 seconds    Standing on One Leg Able to lift leg independently and hold > 10 seconds   10 seconds both legs   Total Score 54    Berg comment: 54/56   performed with tape over L eye on pt's glasses     High Level Balance   High Level Balance Comments mCTSIB: conditions 1-3: 30 seconds, mild sway to L with 2 and 3, condition 4: 6.3 seconds                          [facility] Adult PT Treatment/Exercise - 11/09/20 0001       Therapeutic Activites    Therapeutic Activities Other Therapeutic Activities    Other Therapeutic Activities Pt asking how often she should wear her glasses with tape over L eye to help with double vision s/p diplopia - pt to discuss further with OT tomorrow. Pt reporting lightheadedness at times when standing up too quickly, assessed BP from sit > stand, no orthostatics noted, but did have a slight drop. Instructed pt to take her time with transitional movements and to wait a couple seconds before walking. Pt asking therapist how to monitor her HR on apple watch with PT showing pt how to do so. Pt is only following up with her neurosurgeon after hospitalization (doesn't see them again until October). Discussed importance of following up with PCP and making an appt as pt may also need a referral to see a neurologist.                    PT Education - 11/09/20 1653     Education Details see TA    Person(s) Educated Patient    Methods  Explanation    Comprehension Verbalized understanding              PT Short Term Goals - 11/09/20 1655       PT SHORT TERM GOAL #1   Title Pt will be independent with initial HEP in order to build upon functional gains  made in therapy. ALL STGS DUE 12/03/20.    Time 4    Period Weeks    Status New    Target Date 12/03/20      PT SHORT TERM GOAL #2   Title Pt will improve condition 4 of mCTSIB to at least 15 seconds in order to demo improved vestibular input for balance.    Baseline 6.3 seconds on 11/09/20    Time 4    Period Weeks    Status New      PT SHORT TERM GOAL #3   Title Pt will undergo further testing of BERG with STG/LTG written as appropriate.    Baseline 54/56 on 11/09/20, pt wearing glasses for double vision, would benefit from future assessment without glasses    Time 4    Period Weeks    Status New      PT SHORT TERM GOAL #4   Title Pt will ambulate at least 33' with LRAD with supervision over indoor level surfaces in order to demo improved household mobility.    Time 4    Period Weeks    Status New      PT SHORT TERM GOAL #5   Title Pt will improve gait speed to at least 2.2 ft/sec with RW vs. LRAD in order to demo improved community mobility.    Baseline 1.84 ft/sec with RW.    Time 4    Period Weeks    Status New               PT Long Term Goals - 11/05/20 0839       PT LONG TERM GOAL #1   Title BERG goal/higher level balance test goal to be written as appropriate. ALL LTGS DUE 12/31/20    Time 8    Period Weeks    Status New    Target Date 12/31/20      PT LONG TERM GOAL #2   Title Pt will improve gait speed to at least 2.8 ft/sec with LRAD vs. no AD in order to demo improved community mobility.    Baseline 1.84 ft/sec with RW    Time 8    Period Weeks    Status New      PT LONG TERM GOAL #3   Title Pt will ambulate at least 500' outdoors over paved/unlevel surfaces with supervision and LRAD/no AD in order to demo improved community  mobility.    Time 8    Period Weeks    Status New      PT LONG TERM GOAL #4   Title Vestibular/mCTSIB goal to be written as appropriate.    Baseline not yet assessed.    Time 8    Period Weeks    Status New      PT LONG TERM GOAL #5   Title Pt will perform 16 steps using single handrail and step through pattern with supervision in order to be able to go up to the 2nd floor of her house.    Baseline unable to go upstairs    Time 8    Period Weeks    Status New                   Plan - 11/09/20 1804     Clinical Impression Statement Performed the BERG today with pt scoring a 54/56, however results may have been skewed due to pt wearing her taped glasses for diplopia for double vision as pt reports that since she  has been gaining vision in her L eye has had incr nausea and dizziness. Performed the mCTSIB today with pt only able to hold condition 4 for 6.3 seconds, indicating decr vestibular input for balance. Will continue to progress towards LTGs.    Personal Factors and Comorbidities Comorbidity 3+;Past/Current Experience    Comorbidities hypothyroidism, hiatal hernia, spincter of Oddi dysfunciton, depression, and anxiety.    Examination-Activity Limitations Stairs;Transfers;Squat;Locomotion Level    Examination-Participation Restrictions Community Activity;Driving;Cleaning;Laundry   playing tennis   Stability/Clinical Decision Making Evolving/Moderate complexity    Rehab Potential Good    PT Frequency 2x / week    PT Duration 8 weeks    PT Treatment/Interventions ADLs/Self Care Home Management;Stair training;Gait training;DME Instruction;Functional mobility training;Therapeutic activities;Therapeutic exercise;Neuromuscular re-education;Balance training;Patient/family education;Vestibular;Visual/perceptual remediation/compensation    PT Next Visit Plan vestibular assessment (may want to try MSQ), initial HEP for balance and functional strengthening.    Consulted and Agree  with Plan of Care Patient             Patient will benefit from skilled therapeutic intervention in order to improve the following deficits and impairments:  Abnormal gait, Decreased activity tolerance, Decreased balance, Dizziness, Decreased strength, Impaired vision/preception, Decreased endurance  Visit Diagnosis: Unsteadiness on feet  Other abnormalities of gait and mobility  Dizziness and giddiness  Other symptoms and signs involving the nervous system     Problem List Patient Active Problem List   Diagnosis Date Noted   Brain mass 10/21/2020   S/P resection of meningioma 10/21/2020   Chronic headaches 10/14/2020   Frontal mass of brain 10/13/2020   Elevated LFTs    Dilated cbd, acquired    Abdominal pain 12/06/2017   Hypothyroidism 12/06/2017   Night sweats 03/11/2015   S/P endometrial ablation 03/12/2013   Premenstrual syndrome 03/12/2013   Anemia 08/14/2012   Pelvic pain in female 08/13/2012   Menorrhagia     Arliss Journey, PT, DPT  11/09/2020, 6:04 PM  [facility] [address] [place], [place], [address] Phone: [phone]   Fax:  [phone]  Name: Lydia Jenkins MRN: [id number] Date of Birth: 1971-02-18

## 2020-11-10 ENCOUNTER — Encounter: Payer: Self-pay | Admitting: Occupational Therapy

## 2020-11-10 ENCOUNTER — Ambulatory Visit: Payer: 59 | Admitting: Occupational Therapy

## 2020-11-10 ENCOUNTER — Encounter: Payer: Self-pay | Admitting: Physical Therapy

## 2020-11-10 ENCOUNTER — Ambulatory Visit: Payer: 59 | Admitting: Physical Therapy

## 2020-11-10 DIAGNOSIS — R2681 Unsteadiness on feet: Secondary | ICD-10-CM | POA: Diagnosis not present

## 2020-11-10 DIAGNOSIS — R42 Dizziness and giddiness: Secondary | ICD-10-CM

## 2020-11-10 DIAGNOSIS — R2689 Other abnormalities of gait and mobility: Secondary | ICD-10-CM

## 2020-11-10 DIAGNOSIS — R41840 Attention and concentration deficit: Secondary | ICD-10-CM

## 2020-11-10 DIAGNOSIS — R41842 Visuospatial deficit: Secondary | ICD-10-CM

## 2020-11-10 NOTE — Therapy (Addendum)
[facility] [address] [place], [place], [address] Phone: [phone]   Fax:  [phone]  Physical Therapy Treatment  Patient Details  Name: Lydia Jenkins MRN: [id number] Date of Birth: Apr 16, 1971 Referring Provider (PT): Vallarie Mare, MD   Encounter Date: 11/10/2020   PT End of Session - 11/10/20 1748     Visit Number 3    Number of Visits 17    Date for PT Re-Evaluation 01/03/21    Authorization Type UHC 2022    PT Start Time 8502    PT Stop Time 7741    PT Time Calculation (min) 43 min    Equipment Utilized During Treatment Gait belt    Activity Tolerance Patient tolerated treatment well    Behavior During Therapy WFL for tasks assessed/performed             Past Medical History:  Diagnosis Date   Abnormal Pap smear    Anemia    iron deficient   Anxiety    Breast disorder    precencerous lesion, marker placed   Complication of anesthesia    Depression    Family history of adverse reaction to anesthesia    Hiatal hernia    Hypothyroidism    Liver dysfunction    Menorrhagia    Migraine headache with aura    PONV (postoperative nausea and vomiting)     Past Surgical History:  Procedure Laterality Date   ABDOMINOPLASTY  2878   APPLICATION OF CRANIAL NAVIGATION N/A 10/21/2020   Procedure: APPLICATION OF CRANIAL NAVIGATION;  Surgeon: Vallarie Mare, MD;  Location: [facility];  Service: Neurosurgery;  Laterality: N/A;   AUGMENTATION MAMMAPLASTY Bilateral 2009   BILIARY STENT PLACEMENT N/A 12/10/2017   Procedure: BILIARY STENT PLACEMENT;  Surgeon: Carol Ada, MD;  Location: [facility];  Service: Endoscopy;  Laterality: N/A;  plastic 8.5 x 5 stent placed   BREAST BIOPSY Left 2010   precancerous bx   CRANIOTOMY Left 10/21/2020   Procedure: LEFT FRONTAL CRANIOTOMY FOR RESECTION OF MENINGIOMA;  Surgeon: Vallarie Mare, MD;  Location: [facility];  Service: Neurosurgery;  Laterality: Left;    CRYOTHERAPY     cervix-3x   ENDOMETRIAL ABLATION  2010   ERCP N/A 12/10/2017   Procedure: ENDOSCOPIC RETROGRADE CHOLANGIOPANCREATOGRAPHY (ERCP);  Surgeon: Carol Ada, MD;  Location: [facility];  Service: Endoscopy;  Laterality: N/A;  Balloon sweep of duct   LAPAROSCOPIC CHOLECYSTECTOMY  2008   SPHINCTEROTOMY  12/10/2017   Procedure: SPHINCTEROTOMY;  Surgeon: Carol Ada, MD;  Location: [facility];  Service: Endoscopy;;   TUBAL LIGATION  1998    There were no vitals filed for this visit.   Subjective Assessment - 11/10/20 1405     Subjective Feeling more wobbly today - in OT, went to a patch and now it is over her right eye instead of her left. Feels more wobbly when she is walking.    Pertinent History hypothyroidism, hiatal hernia, spincter of Oddi dysfunciton, depression, and anxiety.    Limitations Walking    Patient Stated Goals wants to be back normal (if not better) - played tennis 3-5x a week and was doing crossfit, likes hiking    Currently in Pain? No/denies                     Vestibular Assessment - 11/10/20 1407       Positional Sensitivities   Head Turning x 5 Moderate dizziness   performed x3 reps,  slowly   Head Nodding x 5 No dizziness                      [facility] Adult PT Treatment/Exercise - 11/10/20 1436       Neuro Re-ed    Neuro Re-ed Details  NMR: seated head turns 2 x 3 reps - performed slowly with pt reporting dizziness/swimmyheadedness, seated head nods 2 x 5 reps - no sx noted in seated going more at a slower speed, standing weight shift into corner with UE support and then none then performed on a single pillow x10 reps each.              Access Code: [email or web address] URL: [email or web address] Date: 11/10/2020 Prepared by: Janann August  Initiated HEP in seated and standing, see MedBridge for further details.    Exercises Seated LEFT AND RIGHT Head Turns - 2 x daily - 5 x weekly - 2-3 sets - 3  reps Standing Balance with Eyes Closed on Foam - 1-2 x daily - 5 x weekly - 3 sets - 15 hold - on single pillow with wide BOS Romberg Stance on Foam Pad - 1-2 x daily - 5 x weekly - 3 sets - 30 hold - feet together (moving L eye) on single pillow  Wide Stance with Head Nods and Counter Support - 1-2 x daily - 5 x weekly - 2-3 sets - 5 reps - standing in corner with support from chair, minimal dizziness       PT Education - 11/10/20 1747     Education Details initial HEP    Person(s) Educated Patient    Methods Explanation;Demonstration;Handout    Comprehension Returned demonstration;Verbalized understanding              PT Short Term Goals - 11/09/20 1655       PT SHORT TERM GOAL #1   Title Pt will be independent with initial HEP in order to build upon functional gains made in therapy. ALL STGS DUE 12/03/20.    Time 4    Period Weeks    Status New    Target Date 12/03/20      PT SHORT TERM GOAL #2   Title Pt will improve condition 4 of mCTSIB to at least 15 seconds in order to demo improved vestibular input for balance.    Baseline 6.3 seconds on 11/09/20    Time 4    Period Weeks    Status New      PT SHORT TERM GOAL #3   Title Pt will undergo further testing of BERG with STG/LTG written as appropriate.    Baseline 54/56 on 11/09/20, pt wearing glasses for double vision, would benefit from future assessment without glasses    Time 4    Period Weeks    Status New      PT SHORT TERM GOAL #4   Title Pt will ambulate at least 52' with LRAD with supervision over indoor level surfaces in order to demo improved household mobility.    Time 4    Period Weeks    Status New      PT SHORT TERM GOAL #5   Title Pt will improve gait speed to at least 2.2 ft/sec with RW vs. LRAD in order to demo improved community mobility.    Baseline 1.84 ft/sec with RW.    Time 4    Period Weeks    Status New  PT Long Term Goals - 11/05/20 0839       PT LONG TERM GOAL  #1   Title BERG goal/higher level balance test goal to be written as appropriate. ALL LTGS DUE 12/31/20    Time 8    Period Weeks    Status New    Target Date 12/31/20      PT LONG TERM GOAL #2   Title Pt will improve gait speed to at least 2.8 ft/sec with LRAD vs. no AD in order to demo improved community mobility.    Baseline 1.84 ft/sec with RW    Time 8    Period Weeks    Status New      PT LONG TERM GOAL #3   Title Pt will ambulate at least 500' outdoors over paved/unlevel surfaces with supervision and LRAD/no AD in order to demo improved community mobility.    Time 8    Period Weeks    Status New      PT LONG TERM GOAL #4   Title Vestibular/mCTSIB goal to be written as appropriate.    Baseline not yet assessed.    Time 8    Period Weeks    Status New      PT LONG TERM GOAL #5   Title Pt will perform 16 steps using single handrail and step through pattern with supervision in order to be able to go up to the 2nd floor of her house.    Baseline unable to go upstairs    Time 8    Period Weeks    Status New                   Plan - 11/11/20 0840     Clinical Impression Statement With previous OT session today,  eye patch was provided for right eye (previously had tape over L side of glasses). Pt reporting feeling more unsteady and dizzy today. Today's skilled session focused on initiating HEP for coordinated head/eye movements and standing balance to pt's tolerance (minimal sx noted). Pt with getting waves of dizziness when having to look down suddenly in standing without UE suppot. Will continue to progress towards LTGs.    Personal Factors and Comorbidities Comorbidity 3+;Past/Current Experience    Comorbidities hypothyroidism, hiatal hernia, spincter of Oddi dysfunciton, depression, and anxiety.    Examination-Activity Limitations Stairs;Transfers;Squat;Locomotion Level    Examination-Participation Restrictions Community Activity;Driving;Cleaning;Laundry   playing  tennis   Stability/Clinical Decision Making Evolving/Moderate complexity    Rehab Potential Good    PT Frequency 2x / week    PT Duration 8 weeks    PT Treatment/Interventions ADLs/Self Care Home Management;Stair training;Gait training;DME Instruction;Functional mobility training;Therapeutic activities;Therapeutic exercise;Neuromuscular re-education;Balance training;Patient/family education;Vestibular;Visual/perceptual remediation/compensation    PT Next Visit Plan perform vestib assessment (MSQ, look at eye movement).  how was initial HEP? continued with coordinate eye and head movements and standing balance on unlevel surfaces/vision removed.    PT Home Exercise Plan [email or web address]    Consulted and Agree with Plan of Care Patient             Patient will benefit from skilled therapeutic intervention in order to improve the following deficits and impairments:  Abnormal gait, Decreased activity tolerance, Decreased balance, Dizziness, Decreased strength, Impaired vision/preception, Decreased endurance  Visit Diagnosis: Unsteadiness on feet  Dizziness and giddiness  Other abnormalities of gait and mobility     Problem List Patient Active Problem List   Diagnosis Date Noted   Brain mass 10/21/2020  S/P resection of meningioma 10/21/2020   Chronic headaches 10/14/2020   Frontal mass of brain 10/13/2020   Elevated LFTs    Dilated cbd, acquired    Abdominal pain 12/06/2017   Hypothyroidism 12/06/2017   Night sweats 03/11/2015   S/P endometrial ablation 03/12/2013   Premenstrual syndrome 03/12/2013   Anemia 08/14/2012   Pelvic pain in female 08/13/2012   Menorrhagia     Arliss Journey, PT, DPT  11/11/2020, 8:43 AM  [facility] [address] [place], [place], [address] Phone: [phone]   Fax:  [phone]  Name: CHEYENE HAMRIC MRN: [id number] Date of Birth: [birth year]

## 2020-11-10 NOTE — Patient Instructions (Signed)
Access Code: [email or web address] URL: [email or web address] Date: 11/10/2020 Prepared by: Janann August  Exercises Seated LEFT AND RIGHT Head Turns - 2 x daily - 5 x weekly - 2-3 sets - 3 reps Standing Balance with Eyes Closed on Foam - 1-2 x daily - 5 x weekly - 3 sets - 15 hold Romberg Stance on Foam Pad - 1-2 x daily - 5 x weekly - 3 sets - 30 hold Wide Stance with Head Nods and Counter Support - 1-2 x daily - 5 x weekly - 2-3 sets - 5 reps

## 2020-11-10 NOTE — Therapy (Signed)
[facility] [address] [place], [place], [address] Phone: [phone]   Fax:  [phone]  Occupational Therapy Treatment  Patient Details  Name: Lydia Jenkins MRN: [id number] Date of Birth: Oct 18, 1970 Referring Provider (OT): Duffy Rhody, MD   Encounter Date: 11/10/2020   OT End of Session - 11/10/20 1335     Visit Number 2    Number of Visits 17    Date for OT Re-Evaluation 12/31/20    Authorization Type U[employer]    Authorization Time Period VL:MN No Auth    OT Start Time 1230    OT Stop Time 1320    OT Time Calculation (min) 50 min    Activity Tolerance Patient limited by fatigue             Past Medical History:  Diagnosis Date   Abnormal Pap smear    Anemia    iron deficient   Anxiety    Breast disorder    precencerous lesion, marker placed   Complication of anesthesia    Depression    Family history of adverse reaction to anesthesia    Hiatal hernia    Hypothyroidism    Liver dysfunction    Menorrhagia    Migraine headache with aura    PONV (postoperative nausea and vomiting)     Past Surgical History:  Procedure Laterality Date   ABDOMINOPLASTY  5956   APPLICATION OF CRANIAL NAVIGATION N/A 10/21/2020   Procedure: APPLICATION OF CRANIAL NAVIGATION;  Surgeon: Vallarie Mare, MD;  Location: [facility];  Service: Neurosurgery;  Laterality: N/A;   AUGMENTATION MAMMAPLASTY Bilateral 2009   BILIARY STENT PLACEMENT N/A 12/10/2017   Procedure: BILIARY STENT PLACEMENT;  Surgeon: Carol Ada, MD;  Location: [facility];  Service: Endoscopy;  Laterality: N/A;  plastic 8.5 x 5 stent placed   BREAST BIOPSY Left 2010   precancerous bx   CRANIOTOMY Left 10/21/2020   Procedure: LEFT FRONTAL CRANIOTOMY FOR RESECTION OF MENINGIOMA;  Surgeon: Vallarie Mare, MD;  Location: [facility];  Service: Neurosurgery;  Laterality: Left;   CRYOTHERAPY     cervix-3x   ENDOMETRIAL ABLATION  2010   ERCP N/A 12/10/2017    Procedure: ENDOSCOPIC RETROGRADE CHOLANGIOPANCREATOGRAPHY (ERCP);  Surgeon: Carol Ada, MD;  Location: [facility];  Service: Endoscopy;  Laterality: N/A;  Balloon sweep of duct   LAPAROSCOPIC CHOLECYSTECTOMY  2008   SPHINCTEROTOMY  12/10/2017   Procedure: SPHINCTEROTOMY;  Surgeon: Carol Ada, MD;  Location: [facility];  Service: Endoscopy;;   TUBAL LIGATION  1998    There were no vitals filed for this visit.   Subjective Assessment - 11/10/20 1328     Subjective  Patient reports feeling very "swimmy headed today"  and also very emotional.    Pertinent History sphincter of oddi dysfunction, hiatal hernia, hypothyroidism, depression, anxiety    Patient Stated Goals "get back to what I was before if not better"    Currently in Pain? No/denies    Pain Score 0-No pain                          OT Treatments/Exercises (OP) - 11/10/20 0001       ADLs   ADL Comments Patient reports feeling emotional, and still not feeling mentally sharp.  Feels she may be trying to do too much at home, but also wanting to progress and keep body and mind active.  Discussed balancing rest and activity - and rest  meaning visual rest - may benefit from lights down, and music on versus reading, playing on tablet.      Visual/Perceptual Exercises   Other Exercises Patient now with left eye fully open.  Working to determine best visual fieds for eye coordination.  Left superior field optimal vision.  Patient came with left lens occluded on glasses.  Having difficulty seeing clearly as left eye better with close range.  Tried right eye nasal occlusion on glasses.  Patient with decreased diplopia symptoms only in central vision - diplopia with any peripheral vision.  Patient visually disorganized with partial ooclusion.  Provided eye patch for glasses for right eye (can be alternated as needed)  Patient uses left eye flr close vision and right eye for distance.  Demonstrated use of reading guides  - paper and electronic reference given.  Patient discussed need to increase font of computer, is already doing this on tablet and phone.                    OT Education - 11/10/20 1334     Education Details reading focus cards, patching/ taping to reduce diplopia to aide with mobility/ ADL/IADL    Person(s) Educated Patient    Methods Explanation    Comprehension Verbalized understanding;Need further instruction              OT Short Term Goals - 11/10/20 1339       OT SHORT TERM GOAL #1   Title Pt will be independent with diplopia HEP    Time 4    Period Weeks    Status New    Target Date 12/03/20      OT SHORT TERM GOAL #2   Title Pt will perform environmental scanning with 90% Accuracy and no reports of diplopia with occlusion PRN.    Time 4    Period Weeks    Status New      OT SHORT TERM GOAL #3   Title Pt will be independent with occlusion schedule and recommendations per OT PRN    Time 4    Period Weeks    Status New      OT SHORT TERM GOAL #4   Title Pt will perform simple warm meal prep and/or light housekeeping with no reports of diplopia with occlusion PRN and with mod I.    Time 4    Period Weeks    Status New               OT Long Term Goals - 11/10/20 1339       OT LONG TERM GOAL #1   Title Pt will be independent with updated HEPs    Time 8    Period Weeks    Status New      OT LONG TERM GOAL #2   Title Pt will perform environmental scanning with cognitive component with 95% accuracy or greater and with no reports of diplopia with occlusion only PRN.    Time 8    Period Weeks    Status New      OT LONG TERM GOAL #3   Title Pt will perform mod complex meal prep (i.e. following instructions on side of rice) with mod I and no reports of diplopia with occlusion PRN    Time 8    Period Weeks    Status New      OT LONG TERM GOAL #4   Title Pt will tolerate visual activity with no occlusion  for 5 minutes or greater with no  reports of disturbance/dizziness.    Time 8    Period Weeks    Status New                   Plan - 11/10/20 1336     Clinical Impression Statement Pt is showing nice recovery and left eye now open, swelling dissipating.  Patient still limited by diplopia, decreased balance, lightheadedness - more evident with head position changes (eg leaning down to pick up item from floor)  Patient very motivated for improvement and actively engaged in rehab process.    OT Occupational Profile and History Problem Focused Assessment - Including review of records relating to presenting problem    Occupational performance deficits (Please refer to evaluation for details): ADL's;IADL's    Body Structure / Function / Physical Skills Vision;Mobility;GMC;Strength;IADL;ADL;Decreased knowledge of use of DME    Rehab Potential Good    Clinical Decision Making Limited treatment options, no task modification necessary    Comorbidities Affecting Occupational Performance: None    Modification or Assistance to Complete Evaluation  No modification of tasks or assist necessary to complete eval    OT Frequency 2x / week    OT Duration 8 weeks   may d/c early and/or reduce to 1 x a week depending on progress.   OT Treatment/Interventions Self-care/ADL training;DME and/or AE instruction;Visual/perceptual remediation/compensation;Patient/family education;Therapist, nutritional;Therapeutic exercise;Neuromuscular education;Therapeutic activities    Plan Diplopia compensation - taper patching/taping as able. work toward coordinated eye movement, needs head/eye movement acclimation    Consulted and Agree with Plan of Care Patient             Patient will benefit from skilled therapeutic intervention in order to improve the following deficits and impairments:   Body Structure / Function / Physical Skills: Vision, Mobility, GMC, Strength, IADL, ADL, Decreased knowledge of use of DME       Visit  Diagnosis: Unsteadiness on feet  Visuospatial deficit  Attention and concentration deficit    Problem List Patient Active Problem List   Diagnosis Date Noted   Brain mass 10/21/2020   S/P resection of meningioma 10/21/2020   Chronic headaches 10/14/2020   Frontal mass of brain 10/13/2020   Elevated LFTs    Dilated cbd, acquired    Abdominal pain 12/06/2017   Hypothyroidism 12/06/2017   Night sweats 03/11/2015   S/P endometrial ablation 03/12/2013   Premenstrual syndrome 03/12/2013   Anemia 08/14/2012   Pelvic pain in female 08/13/2012   Menorrhagia     Mariah Milling 11/10/2020, 1:40 PM  [facility] [address] [place], [place], [address] Phone: [phone]   Fax:  [phone]  Name: Lydia Jenkins MRN: [id number] Date of Birth: 08-28-1970

## 2020-11-11 ENCOUNTER — Ambulatory Visit: Payer: 59 | Admitting: Occupational Therapy

## 2020-11-12 ENCOUNTER — Encounter: Payer: 59 | Admitting: Occupational Therapy

## 2020-11-15 ENCOUNTER — Ambulatory Visit: Payer: 59 | Admitting: Physical Therapy

## 2020-11-15 ENCOUNTER — Encounter: Payer: Self-pay | Admitting: Physical Therapy

## 2020-11-15 ENCOUNTER — Ambulatory Visit: Payer: 59 | Admitting: Occupational Therapy

## 2020-11-15 ENCOUNTER — Other Ambulatory Visit: Payer: Self-pay

## 2020-11-15 DIAGNOSIS — R2689 Other abnormalities of gait and mobility: Secondary | ICD-10-CM

## 2020-11-15 DIAGNOSIS — R42 Dizziness and giddiness: Secondary | ICD-10-CM

## 2020-11-15 DIAGNOSIS — R2681 Unsteadiness on feet: Secondary | ICD-10-CM | POA: Diagnosis not present

## 2020-11-15 NOTE — Therapy (Addendum)
[facility] [address] [place], [place], [address] Phone: [phone]   Fax:  [phone]  Physical Therapy Treatment  Patient Details  Name: Lydia Jenkins MRN: [id number] Date of Birth: 1970-10-21 Referring Provider (PT): Vallarie Mare, MD   Encounter Date: 11/15/2020   PT End of Session - 11/15/20 0842     Visit Number 4    Number of Visits 17    Date for PT Re-Evaluation 01/03/21    Authorization Type UHC 2022    PT Start Time 3716    PT Stop Time 0840    PT Time Calculation (min) 45 min    Equipment Utilized During Treatment Gait belt    Activity Tolerance Patient tolerated treatment well    Behavior During Therapy WFL for tasks assessed/performed             Past Medical History:  Diagnosis Date   Abnormal Pap smear    Anemia    iron deficient   Anxiety    Breast disorder    precencerous lesion, marker placed   Complication of anesthesia    Depression    Family history of adverse reaction to anesthesia    Hiatal hernia    Hypothyroidism    Liver dysfunction    Menorrhagia    Migraine headache with aura    PONV (postoperative nausea and vomiting)     Past Surgical History:  Procedure Laterality Date   ABDOMINOPLASTY  9678   APPLICATION OF CRANIAL NAVIGATION N/A 10/21/2020   Procedure: APPLICATION OF CRANIAL NAVIGATION;  Surgeon: Vallarie Mare, MD;  Location: [facility];  Service: Neurosurgery;  Laterality: N/A;   AUGMENTATION MAMMAPLASTY Bilateral 2009   BILIARY STENT PLACEMENT N/A 12/10/2017   Procedure: BILIARY STENT PLACEMENT;  Surgeon: Carol Ada, MD;  Location: [facility];  Service: Endoscopy;  Laterality: N/A;  plastic 8.5 x 5 stent placed   BREAST BIOPSY Left 2010   precancerous bx   CRANIOTOMY Left 10/21/2020   Procedure: LEFT FRONTAL CRANIOTOMY FOR RESECTION OF MENINGIOMA;  Surgeon: Vallarie Mare, MD;  Location: [facility];  Service: Neurosurgery;  Laterality: Left;    CRYOTHERAPY     cervix-3x   ENDOMETRIAL ABLATION  2010   ERCP N/A 12/10/2017   Procedure: ENDOSCOPIC RETROGRADE CHOLANGIOPANCREATOGRAPHY (ERCP);  Surgeon: Carol Ada, MD;  Location: [facility];  Service: Endoscopy;  Laterality: N/A;  Balloon sweep of duct   LAPAROSCOPIC CHOLECYSTECTOMY  2008   SPHINCTEROTOMY  12/10/2017   Procedure: SPHINCTEROTOMY;  Surgeon: Carol Ada, MD;  Location: [facility];  Service: Endoscopy;;   TUBAL LIGATION  1998    There were no vitals filed for this visit.   Subjective Assessment - 11/15/20 0758     Subjective Did her exercises at home, went well. Has been switching the eye patch. Has noted having more anxiety recently. Going to see a therapeutic yoga teacher later this week to help her anxiety.    Pertinent History hypothyroidism, hiatal hernia, spincter of Oddi dysfunciton, depression, and anxiety.    Limitations Walking    Patient Stated Goals wants to be back normal (if not better) - played tennis 3-5x a week and was doing crossfit, likes hiking    Currently in Pain? Yes    Pain Score 4     Pain Location Head    Pain Orientation Right    Pain Descriptors / Indicators Headache;Pressure  [facility] Adult PT Treatment/Exercise - 11/15/20 0841       Therapeutic Activites    Therapeutic Activities Other Therapeutic Activities    Other Therapeutic Activities discussed breathing techniques to help with pt's anxiety with diaphragmatic breathing and inhaling for a count of 4 and exhaling for a count of 6-8, as pt could be overusing her accessory muscles for breathing. Pt to see a therapeutic yoga instructor later this week                Balance Exercises - 11/15/20 0824       Balance Exercises: Standing   Standing Eyes Opened Wide (BOA);Limitations    Standing Eyes Opened Limitations standing in corner on level ground - beginning with UE support and then none with wider BOS to pt having feet  hip width, 3 x 5 reps head nods, 3 x 5 reps. No reports of dizziness today    Standing Eyes Closed Wide (BOA);Foam/compliant surface;Limitations    Standing Eyes Closed Limitations on single pillow in corner wider BOS to feet more hip width distance 3 x 30 seconds, discussed at home if pt notices 15 seconds gets easy (given at HEP) to gradually work on incr the time    Tandem Stance Eyes open;Foam/compliant surface;Limitations    Tandem Stance Time on single pillow in corner,full tandem with RLE 3 x 30 seconds with intermittent taps to chair for balance, partial tandem with LLE posteriorly, full tandem with LLE 2 x 15 seconds    Rockerboard Anterior/posterior;EO;Lateral;Limitations    Rockerboard Limitations on lower profile rockerboard, weight shifting x15 reps each direction, beginning with UE support and then progressing to none                 PT Short Term Goals - 11/09/20 1655       PT SHORT TERM GOAL #1   Title Pt will be independent with initial HEP in order to build upon functional gains made in therapy. ALL STGS DUE 12/03/20.    Time 4    Period Weeks    Status New    Target Date 12/03/20      PT SHORT TERM GOAL #2   Title Pt will improve condition 4 of mCTSIB to at least 15 seconds in order to demo improved vestibular input for balance.    Baseline 6.3 seconds on 11/09/20    Time 4    Period Weeks    Status New      PT SHORT TERM GOAL #3   Title Pt will undergo further testing of BERG with STG/LTG written as appropriate.    Baseline 54/56 on 11/09/20, pt wearing glasses for double vision, would benefit from future assessment without glasses    Time 4    Period Weeks    Status New      PT SHORT TERM GOAL #4   Title Pt will ambulate at least 3' with LRAD with supervision over indoor level surfaces in order to demo improved household mobility.    Time 4    Period Weeks    Status New      PT SHORT TERM GOAL #5   Title Pt will improve gait speed to at least 2.2  ft/sec with RW vs. LRAD in order to demo improved community mobility.    Baseline 1.84 ft/sec with RW.    Time 4    Period Weeks    Status New  PT Long Term Goals - 11/05/20 0839       PT LONG TERM GOAL #1   Title BERG goal/higher level balance test goal to be written as appropriate. ALL LTGS DUE 12/31/20    Time 8    Period Weeks    Status New    Target Date 12/31/20      PT LONG TERM GOAL #2   Title Pt will improve gait speed to at least 2.8 ft/sec with LRAD vs. no AD in order to demo improved community mobility.    Baseline 1.84 ft/sec with RW    Time 8    Period Weeks    Status New      PT LONG TERM GOAL #3   Title Pt will ambulate at least 500' outdoors over paved/unlevel surfaces with supervision and LRAD/no AD in order to demo improved community mobility.    Time 8    Period Weeks    Status New      PT LONG TERM GOAL #4   Title Vestibular/mCTSIB goal to be written as appropriate.    Baseline not yet assessed.    Time 8    Period Weeks    Status New      PT LONG TERM GOAL #5   Title Pt will perform 16 steps using single handrail and step through pattern with supervision in order to be able to go up to the 2nd floor of her house.    Baseline unable to go upstairs    Time 8    Period Weeks    Status New                   Plan - 11/15/20 1025     Clinical Impression Statement Session performed with patch over L eye today. Focused on balance strategies with head motions, tandem, weight shifting and eyes closed on level ground and a single pillow. Pt tolerated session well today, no episodes of dizziness, but does need to close her eyes and reset between reps and activities due to incr visual input at times. Will continue to progress towards LTGs.    Personal Factors and Comorbidities Comorbidity 3+;Past/Current Experience    Comorbidities hypothyroidism, hiatal hernia, spincter of Oddi dysfunciton, depression, and anxiety.     Examination-Activity Limitations Stairs;Transfers;Squat;Locomotion Level    Examination-Participation Restrictions Community Activity;Driving;Cleaning;Laundry   playing tennis   Stability/Clinical Decision Making Evolving/Moderate complexity    Rehab Potential Good    PT Frequency 2x / week    PT Duration 8 weeks    PT Treatment/Interventions ADLs/Self Care Home Management;Stair training;Gait training;DME Instruction;Functional mobility training;Therapeutic activities;Therapeutic exercise;Neuromuscular re-education;Balance training;Patient/family education;Vestibular;Visual/perceptual remediation/compensation    PT Next Visit Plan perform further vestibular assessment. continue with coordinated eye and head movements and standing balance on unlevel surfaces/vision removed.    PT Home Exercise Plan [id number]    Consulted and Agree with Plan of Care Patient             Patient will benefit from skilled therapeutic intervention in order to improve the following deficits and impairments:  Abnormal gait, Decreased activity tolerance, Decreased balance, Dizziness, Decreased strength, Impaired vision/preception, Decreased endurance  Visit Diagnosis: Unsteadiness on feet  Dizziness and giddiness  Other abnormalities of gait and mobility     Problem List Patient Active Problem List   Diagnosis Date Noted   Brain mass 10/21/2020   S/P resection of meningioma 10/21/2020   Chronic headaches 10/14/2020   Frontal mass of brain 10/13/2020  Elevated LFTs    Dilated cbd, acquired    Abdominal pain 12/06/2017   Hypothyroidism 12/06/2017   Night sweats 03/11/2015   S/P endometrial ablation 03/12/2013   Premenstrual syndrome 03/12/2013   Anemia 08/14/2012   Pelvic pain in female 08/13/2012   Menorrhagia     Arliss Journey, PT, DPT  11/15/2020, 10:27 AM  [facility] [address] [place], [place], [address] Phone:  [phone]   Fax:  [phone]  Name: Lydia Jenkins MRN: [id number] Date of Birth: 1970/07/27

## 2020-11-16 ENCOUNTER — Ambulatory Visit: Payer: 59 | Admitting: Occupational Therapy

## 2020-11-16 ENCOUNTER — Encounter: Payer: Self-pay | Admitting: Occupational Therapy

## 2020-11-16 DIAGNOSIS — R41840 Attention and concentration deficit: Secondary | ICD-10-CM

## 2020-11-16 DIAGNOSIS — R41842 Visuospatial deficit: Secondary | ICD-10-CM

## 2020-11-16 DIAGNOSIS — R2681 Unsteadiness on feet: Secondary | ICD-10-CM

## 2020-11-16 DIAGNOSIS — R29818 Other symptoms and signs involving the nervous system: Secondary | ICD-10-CM

## 2020-11-16 NOTE — Therapy (Signed)
[facility] [address] [place], [place], [address] Phone: [phone]   Fax:  [phone]  Occupational Therapy Treatment  Patient Details  Name: Lydia Jenkins MRN: [id number] Date of Birth: [birth year] Referring Provider (OT): Duffy Rhody, MD   Encounter Date: 11/16/2020   OT End of Session - 11/16/20 1810     Visit Number 3    Number of Visits 17    Date for OT Re-Evaluation 12/31/20    Authorization Type U[employer]    OT Start Time 8563    OT Stop Time 1700    OT Time Calculation (min) 45 min    Activity Tolerance Patient tolerated treatment well    Behavior During Therapy [facility] for tasks assessed/performed             Past Medical History:  Diagnosis Date   Abnormal Pap smear    Anemia    iron deficient   Anxiety    Breast disorder    precencerous lesion, marker placed   Complication of anesthesia    Depression    Family history of adverse reaction to anesthesia    Hiatal hernia    Hypothyroidism    Liver dysfunction    Menorrhagia    Migraine headache with aura    PONV (postoperative nausea and vomiting)     Past Surgical History:  Procedure Laterality Date   ABDOMINOPLASTY  1497   APPLICATION OF CRANIAL NAVIGATION N/A 10/21/2020   Procedure: APPLICATION OF CRANIAL NAVIGATION;  Surgeon: Vallarie Mare, MD;  Location: [facility];  Service: Neurosurgery;  Laterality: N/A;   AUGMENTATION MAMMAPLASTY Bilateral 2009   BILIARY STENT PLACEMENT N/A 12/10/2017   Procedure: BILIARY STENT PLACEMENT;  Surgeon: Carol Ada, MD;  Location: [facility];  Service: Endoscopy;  Laterality: N/A;  plastic 8.5 x 5 stent placed   BREAST BIOPSY Left 2010   precancerous bx   CRANIOTOMY Left 10/21/2020   Procedure: LEFT FRONTAL CRANIOTOMY FOR RESECTION OF MENINGIOMA;  Surgeon: Vallarie Mare, MD;  Location: [facility];  Service: Neurosurgery;  Laterality: Left;   CRYOTHERAPY     cervix-3x   ENDOMETRIAL ABLATION   2010   ERCP N/A 12/10/2017   Procedure: ENDOSCOPIC RETROGRADE CHOLANGIOPANCREATOGRAPHY (ERCP);  Surgeon: Carol Ada, MD;  Location: [facility];  Service: Endoscopy;  Laterality: N/A;  Balloon sweep of duct   LAPAROSCOPIC CHOLECYSTECTOMY  2008   SPHINCTEROTOMY  12/10/2017   Procedure: SPHINCTEROTOMY;  Surgeon: Carol Ada, MD;  Location: [facility];  Service: Endoscopy;;   TUBAL LIGATION  1998    There were no vitals filed for this visit.   Subjective Assessment - 11/16/20 1633     Subjective  Patient reports that she has been monitoring blood pressure daily. Playing around with reading focus cards for computer    Pertinent History sphincter of oddi dysfunction, hiatal hernia, hypothyroidism, depression, anxiety    Limitations no driving    Patient Stated Goals "get back to what I was before if not better"    Currently in Pain? No/denies    Pain Score 0-No pain                          OT Treatments/Exercises (OP) - 11/16/20 0001       ADLs   ADL Comments Patient purchased reading focus card app and has been using on her computer.  She wants to try it on phone and tablet as well if possible.  Patient  also ordered reading focus actual cards and is waiting for that delivery.      Visual/Perceptual Exercises   Other Exercises Patient reports that she has had difficulty with reflux and nausea partially due to eye patching.  Long discussion about how eye patching can be beneficial, and that she can occlude left eye when mobility is goal (grocery store, crowded therapy gym) as she has better balance with left eye occluded.  When visual acuity is the goal (reading, texting)  - it may be more beneficial to occlude the right eye as left eye sees more clearly.  Patient able to demonstrate understanding of these guidelines.  We will continue to challenge vision, gaze stabilization, eye/head movement, movement tolerance in therapy sessions.      Neurological Re-education  Exercises   Other Exercises 1 In dimly lit room, Working on accomodation of eye movement/ head movement in standing.  Drawing attention to feet on floor for position sense with eyes closed.    Other Exercises 2 Walked back to lobby without walker and close supervision with left eye occluded.                    OT Education - 11/16/20 1808     Education Details alternating patching  to reduce diplopia/ nausea to aide with mobility/ ADL/IADL    Person(s) Educated Patient    Methods Explanation;Demonstration    Comprehension Verbalized understanding;Returned demonstration              OT Short Term Goals - 11/16/20 1813       OT SHORT TERM GOAL #1   Title Pt will be independent with diplopia HEP    Time 4    Period Weeks    Status On-going    Target Date 12/03/20      OT SHORT TERM GOAL #2   Title Pt will perform environmental scanning with 90% Accuracy and no reports of diplopia with occlusion PRN.    Time 4    Period Weeks    Status On-going      OT SHORT TERM GOAL #3   Title Pt will be independent with occlusion schedule and recommendations per OT PRN    Time 4    Period Weeks    Status On-going      OT SHORT TERM GOAL #4   Title Pt will perform simple warm meal prep and/or light housekeeping with no reports of diplopia with occlusion PRN and with mod I.    Time 4    Period Weeks    Status On-going               OT Long Term Goals - 11/16/20 1814       OT LONG TERM GOAL #1   Title Pt will be independent with updated HEPs    Time 8    Period Weeks    Status On-going      OT LONG TERM GOAL #2   Title Pt will perform environmental scanning with cognitive component with 95% accuracy or greater and with no reports of diplopia with occlusion only PRN.    Time 8    Period Weeks    Status On-going      OT LONG TERM GOAL #3   Title Pt will perform mod complex meal prep (i.e. following instructions on side of rice) with mod I and no reports of  diplopia with occlusion PRN    Time 8    Period Weeks  Status On-going      OT LONG TERM GOAL #4   Title Pt will tolerate visual activity with no occlusion for 5 minutes or greater with no reports of disturbance/dizziness.    Time 8    Period Weeks    Status On-going                   Plan - 11/16/20 1811     Clinical Impression Statement Pt continues to show steady improvement and tolerance for activity and positional changes.  Working to find most functional use of compensatory eye patching to maximize visual abilities regrading mobiliy and acuity.    OT Occupational Profile and History Problem Focused Assessment - Including review of records relating to presenting problem    Occupational performance deficits (Please refer to evaluation for details): ADL's;IADL's    Body Structure / Function / Physical Skills Vision;Mobility;GMC;Strength;IADL;ADL;Decreased knowledge of use of DME    Rehab Potential Good    Clinical Decision Making Limited treatment options, no task modification necessary    Comorbidities Affecting Occupational Performance: None    Modification or Assistance to Complete Evaluation  No modification of tasks or assist necessary to complete eval    OT Frequency 2x / week    OT Duration 8 weeks   may d/c early and/or reduce to 1 x a week depending on progress.   OT Treatment/Interventions Self-care/ADL training;DME and/or AE instruction;Visual/perceptual remediation/compensation;Patient/family education;Therapist, nutritional;Therapeutic exercise;Neuromuscular education;Therapeutic activities    Plan Diplopia compensation - taper patching/taping as able. work toward coordinated eye movement, needs head/eye movement acclimation    Consulted and Agree with Plan of Care Patient             Patient will benefit from skilled therapeutic intervention in order to improve the following deficits and impairments:   Body Structure / Function / Physical Skills:  Vision, Mobility, GMC, Strength, IADL, ADL, Decreased knowledge of use of DME       Visit Diagnosis: Visuospatial deficit  Attention and concentration deficit  Other symptoms and signs involving the nervous system  Unsteadiness on feet    Problem List Patient Active Problem List   Diagnosis Date Noted   Brain mass 10/21/2020   S/P resection of meningioma 10/21/2020   Chronic headaches 10/14/2020   Frontal mass of brain 10/13/2020   Elevated LFTs    Dilated cbd, acquired    Abdominal pain 12/06/2017   Hypothyroidism 12/06/2017   Night sweats 03/11/2015   S/P endometrial ablation 03/12/2013   Premenstrual syndrome 03/12/2013   Anemia 08/14/2012   Pelvic pain in female 08/13/2012   Menorrhagia     Lydia Jenkins 11/16/2020, 6:15 PM  [facility] [address] [place], [place], [address] Phone: [phone]   Fax:  [phone]  Name: Lydia Jenkins MRN: [id number] Date of Birth: [birth year]

## 2020-11-18 ENCOUNTER — Ambulatory Visit: Payer: 59

## 2020-11-18 ENCOUNTER — Other Ambulatory Visit: Payer: Self-pay

## 2020-11-18 ENCOUNTER — Ambulatory Visit: Payer: 59 | Admitting: Occupational Therapy

## 2020-11-18 ENCOUNTER — Encounter: Payer: Self-pay | Admitting: Occupational Therapy

## 2020-11-18 DIAGNOSIS — R2689 Other abnormalities of gait and mobility: Secondary | ICD-10-CM

## 2020-11-18 DIAGNOSIS — R29818 Other symptoms and signs involving the nervous system: Secondary | ICD-10-CM

## 2020-11-18 DIAGNOSIS — R2681 Unsteadiness on feet: Secondary | ICD-10-CM

## 2020-11-18 DIAGNOSIS — R42 Dizziness and giddiness: Secondary | ICD-10-CM

## 2020-11-18 DIAGNOSIS — R41842 Visuospatial deficit: Secondary | ICD-10-CM

## 2020-11-18 DIAGNOSIS — R41840 Attention and concentration deficit: Secondary | ICD-10-CM

## 2020-11-18 NOTE — Therapy (Signed)
[facility] [address] [place], [place], [address] Phone: [phone]   Fax:  [phone]  Occupational Therapy Treatment  Patient Details  Name: Lydia Jenkins MRN: [id number] Date of Birth: [birth year] Referring Provider (OT): Duffy Rhody, MD   Encounter Date: 11/18/2020   OT End of Session - 11/18/20 1617     Visit Number 4    Number of Visits 17    Date for OT Re-Evaluation 12/31/20    Authorization Type UHC    OT Start Time 3893    OT Stop Time 1700    OT Time Calculation (min) 45 min    Activity Tolerance Patient tolerated treatment well    Behavior During Therapy [facility] for tasks assessed/performed             Past Medical History:  Diagnosis Date   Abnormal Pap smear    Anemia    iron deficient   Anxiety    Breast disorder    precencerous lesion, marker placed   Complication of anesthesia    Depression    Family history of adverse reaction to anesthesia    Hiatal hernia    Hypothyroidism    Liver dysfunction    Menorrhagia    Migraine headache with aura    PONV (postoperative nausea and vomiting)     Past Surgical History:  Procedure Laterality Date   ABDOMINOPLASTY  7342   APPLICATION OF CRANIAL NAVIGATION N/A 10/21/2020   Procedure: APPLICATION OF CRANIAL NAVIGATION;  Surgeon: Vallarie Mare, MD;  Location: [facility];  Service: Neurosurgery;  Laterality: N/A;   AUGMENTATION MAMMAPLASTY Bilateral 2009   BILIARY STENT PLACEMENT N/A 12/10/2017   Procedure: BILIARY STENT PLACEMENT;  Surgeon: Carol Ada, MD;  Location: [facility];  Service: Endoscopy;  Laterality: N/A;  plastic 8.5 x 5 stent placed   BREAST BIOPSY Left 2010   precancerous bx   CRANIOTOMY Left 10/21/2020   Procedure: LEFT FRONTAL CRANIOTOMY FOR RESECTION OF MENINGIOMA;  Surgeon: Vallarie Mare, MD;  Location: [facility];  Service: Neurosurgery;  Laterality: Left;   CRYOTHERAPY     cervix-3x   ENDOMETRIAL ABLATION   2010   ERCP N/A 12/10/2017   Procedure: ENDOSCOPIC RETROGRADE CHOLANGIOPANCREATOGRAPHY (ERCP);  Surgeon: Carol Ada, MD;  Location[facility];  Service: Endoscopy;  Laterality: N/A;  Balloon sweep of duct   LAPAROSCOPIC CHOLECYSTECTOMY  2008   SPHINCTEROTOMY  12/10/2017   Procedure: SPHINCTEROTOMY;  Surgeon: Carol Ada, MD;  Location: [facility];  Service: Endoscopy;;   TUBAL LIGATION  1998    There were no vitals filed for this visit.   Subjective Assessment - 11/18/20 1618     Subjective  Pt reports disturbance with the left eye and seeing some around the eye patch sort of like "hallucinations" "I tried to unpatch the other day and it was the worse I had felt this whole time"    Pertinent History sphincter of oddi dysfunction, hiatal hernia, hypothyroidism, depression, anxiety    Limitations no driving    Patient Stated Goals "get back to what I was before if not better"    Currently in Pain? Yes    Pain Score 4     Pain Location Head    Pain Orientation Left    Pain Descriptors / Indicators Headache;Tingling    Pain Type Neuropathic pain    Pain Onset In the past 7 days    Pain Frequency Intermittent  OT Education - 11/18/20 1639     Education Details diplopia HEP    Person(s) Educated Patient    Methods Explanation;Demonstration    Comprehension Verbalized understanding;Returned demonstration              OT Short Term Goals - 11/16/20 1813       OT SHORT TERM GOAL #1   Title Pt will be independent with diplopia HEP    Time 4    Period Weeks    Status On-going    Target Date 12/03/20      OT SHORT TERM GOAL #2   Title Pt will perform environmental scanning with 90% Accuracy and no reports of diplopia with occlusion PRN.    Time 4    Period Weeks    Status On-going      OT SHORT TERM GOAL #3   Title Pt will be independent with occlusion schedule and recommendations per OT PRN    Time 4     Period Weeks    Status On-going      OT SHORT TERM GOAL #4   Title Pt will perform simple warm meal prep and/or light housekeeping with no reports of diplopia with occlusion PRN and with mod I.    Time 4    Period Weeks    Status On-going               OT Long Term Goals - 11/16/20 1814       OT LONG TERM GOAL #1   Title Pt will be independent with updated HEPs    Time 8    Period Weeks    Status On-going      OT LONG TERM GOAL #2   Title Pt will perform environmental scanning with cognitive component with 95% accuracy or greater and with no reports of diplopia with occlusion only PRN.    Time 8    Period Weeks    Status On-going      OT LONG TERM GOAL #3   Title Pt will perform mod complex meal prep (i.e. following instructions on side of rice) with mod I and no reports of diplopia with occlusion PRN    Time 8    Period Weeks    Status On-going      OT LONG TERM GOAL #4   Title Pt will tolerate visual activity with no occlusion for 5 minutes or greater with no reports of disturbance/dizziness.    Time 8    Period Weeks    Status On-going                   Plan - 11/19/20 1610     Clinical Impression Statement Pt with consistent left eye patching. Pt responded well to diplopia HEP.    OT Occupational Profile and History Problem Focused Assessment - Including review of records relating to presenting problem    Occupational performance deficits (Please refer to evaluation for details): ADL's;IADL's    Body Structure / Function / Physical Skills Vision;Mobility;GMC;Strength;IADL;ADL;Decreased knowledge of use of DME    Rehab Potential Good    Clinical Decision Making Limited treatment options, no task modification necessary    Comorbidities Affecting Occupational Performance: None    Modification or Assistance to Complete Evaluation  No modification of tasks or assist necessary to complete eval    OT Frequency 2x / week    OT Duration 8 weeks   may d/c  early and/or reduce to 1 x a week  depending on progress.   OT Treatment/Interventions Self-care/ADL training;DME and/or AE instruction;Visual/perceptual remediation/compensation;Patient/family education;Therapist, nutritional;Therapeutic exercise;Neuromuscular education;Therapeutic activities    Plan Diplopia compensation - taper patching/taping as able. work toward coordinated eye movement, needs head/eye movement acclimation    Consulted and Agree with Plan of Care Patient             Patient will benefit from skilled therapeutic intervention in order to improve the following deficits and impairments:   Body Structure / Function / Physical Skills: Vision, Mobility, GMC, Strength, IADL, ADL, Decreased knowledge of use of DME       Visit Diagnosis: Dizziness and giddiness  Unsteadiness on feet  Visuospatial deficit  Attention and concentration deficit  Other symptoms and signs involving the nervous system    Problem List Patient Active Problem List   Diagnosis Date Noted   Brain mass 10/21/2020   S/P resection of meningioma 10/21/2020   Chronic headaches 10/14/2020   Frontal mass of brain 10/13/2020   Elevated LFTs    Dilated cbd, acquired    Abdominal pain 12/06/2017   Hypothyroidism 12/06/2017   Night sweats 03/11/2015   S/P endometrial ablation 03/12/2013   Premenstrual syndrome 03/12/2013   Anemia 08/14/2012   Pelvic pain in female 08/13/2012   Menorrhagia     Zachery Conch MOT, OTR/L  11/19/2020, [facility] [address] [place], [place], [address] Phone: [phone]   Fax:  [phone]  Name: Lydia Jenkins MRN: [id number] Date of Birth: 21-Jun-1970

## 2020-11-18 NOTE — Therapy (Signed)
[facility] [address] [place], [place], [address] Phone: [phone]   Fax:  [phone]  Physical Therapy Treatment  Patient Details  Name: FATOUMATA ALBAUGH MRN: [id number] Date of Birth: [birth year] Referring Provider (PT): Vallarie Mare, MD   Encounter Date: 11/18/2020   PT End of Session - 11/18/20 1531     Visit Number 5    Number of Visits 17    Date for PT Re-Evaluation 01/03/21    Authorization Type UHC 2022    PT Start Time 8841    PT Stop Time 1612    PT Time Calculation (min) 41 min    Equipment Utilized During Treatment Gait belt    Activity Tolerance Patient tolerated treatment well    Behavior During Therapy WFL for tasks assessed/performed             Past Medical History:  Diagnosis Date   Abnormal Pap smear    Anemia    iron deficient   Anxiety    Breast disorder    precencerous lesion, marker placed   Complication of anesthesia    Depression    Family history of adverse reaction to anesthesia    Hiatal hernia    Hypothyroidism    Liver dysfunction    Menorrhagia    Migraine headache with aura    PONV (postoperative nausea and vomiting)     Past Surgical History:  Procedure Laterality Date   ABDOMINOPLASTY  6606   APPLICATION OF CRANIAL NAVIGATION N/A 10/21/2020   Procedure: APPLICATION OF CRANIAL NAVIGATION;  Surgeon: Vallarie Mare, MD;  Location: [facility];  Service: Neurosurgery;  Laterality: N/A;   AUGMENTATION MAMMAPLASTY Bilateral 2009   BILIARY STENT PLACEMENT N/A 12/10/2017   Procedure: BILIARY STENT PLACEMENT;  Surgeon: Carol Ada, MD;  Location: [facility];  Service: Endoscopy;  Laterality: N/A;  plastic 8.5 x 5 stent placed   BREAST BIOPSY Left 2010   precancerous bx   CRANIOTOMY Left 10/21/2020   Procedure: LEFT FRONTAL CRANIOTOMY FOR RESECTION OF MENINGIOMA;  Surgeon: Vallarie Mare, MD;  Location: [facility];  Service: Neurosurgery;  Laterality: Left;    CRYOTHERAPY     cervix-3x   ENDOMETRIAL ABLATION  2010   ERCP N/A 12/10/2017   Procedure: ENDOSCOPIC RETROGRADE CHOLANGIOPANCREATOGRAPHY (ERCP);  Surgeon: Carol Ada, MD;  Location: [facility];  Service: Endoscopy;  Laterality: N/A;  Balloon sweep of duct   LAPAROSCOPIC CHOLECYSTECTOMY  2008   SPHINCTEROTOMY  12/10/2017   Procedure: SPHINCTEROTOMY;  Surgeon: Carol Ada, MD;  Location: [facility];  Service: Endoscopy;;   TUBAL LIGATION  1998    There were no vitals filed for this visit.   Subjective Assessment - 11/18/20 1533     Subjective Patient no new changes/complaints, reports she is still about the same. Feels like she is gradually improving. Still feels like the opening under her eye patch continues to throw her off balance.    Pertinent History hypothyroidism, hiatal hernia, spincter of Oddi dysfunciton, depression, and anxiety.    Limitations Walking    Patient Stated Goals wants to be back normal (if not better) - played tennis 3-5x a week and was doing crossfit, likes hiking    Currently in Pain? No/denies                               [facility] Adult PT Treatment/Exercise - 11/18/20 0001       Ambulation/Gait  Ambulation/Gait Yes    Ambulation/Gait Assistance 5: Supervision;4: Min guard    Ambulation/Gait Assistance Details completed ambulation with RW and without eye occluded x 85 ft. mild nausea but patient tolerating very well. no significant veering noted, despite patient reporting that she feels like she is veering/being pulled to the R.    Ambulation Distance (Feet) 85 Feet    Assistive device Rolling walker    Gait Pattern Step-through pattern    Ambulation Surface Level;Indoor      Self-Care   Self-Care Other Self-Care Comments    Other Self-Care Comments  Patient reports increased feeling of seeing objects that arent there on her left side, due to the area that is open under the eye patch. PT and OT educating patient on potential to  buy full occlusion that goes around head vs. on glassess to allow for total occlusion to avoid this sensation.      Therapeutic Activites    Therapeutic Activities Other Therapeutic Activities    Other Therapeutic Activities Completed hart chart with left eye occluded and completed accomodation exercises with R eye x 3 lines, no symptoms. Progressed to completed with right eye occluded and completed with left eye x 3 lines, increased symptoms but relatively mild with completion. Rest break required with eyes closed between lines.             Vestibular Treatment/Exercise - 11/18/20 0001       Vestibular Treatment/Exercise   Vestibular Treatment Provided Gaze    Gaze Exercises X1 Viewing Horizontal;X1 Viewing Vertical      X1 Viewing Horizontal   Foot Position seated    Reps 2    Comments x 30 seconds; alternating which eye is occluded. increased symptoms with utilizing left eye but mild. PT did bring target slightly closer when using L eye to promote improved tolerance.      X1 Viewing Vertical   Foot Position seated    Reps 2    Comments x 30 seconds; no dizziness with left eye occluded and completing with R eye. With R eye occluded mild dizziness                   PT Education - 11/18/20 1629     Education Details see self care    Person(s) Educated Patient    Methods Explanation    Comprehension Verbalized understanding              PT Short Term Goals - 11/09/20 1655       PT SHORT TERM GOAL #1   Title Pt will be independent with initial HEP in order to build upon functional gains made in therapy. ALL STGS DUE 12/03/20.    Time 4    Period Weeks    Status New    Target Date 12/03/20      PT SHORT TERM GOAL #2   Title Pt will improve condition 4 of mCTSIB to at least 15 seconds in order to demo improved vestibular input for balance.    Baseline 6.3 seconds on 11/09/20    Time 4    Period Weeks    Status New      PT SHORT TERM GOAL #3   Title Pt  will undergo further testing of BERG with STG/LTG written as appropriate.    Baseline 54/56 on 11/09/20, pt wearing glasses for double vision, would benefit from future assessment without glasses    Time 4    Period Weeks    Status  New      PT SHORT TERM GOAL #4   Title Pt will ambulate at least 35' with LRAD with supervision over indoor level surfaces in order to demo improved household mobility.    Time 4    Period Weeks    Status New      PT SHORT TERM GOAL #5   Title Pt will improve gait speed to at least 2.2 ft/sec with RW vs. LRAD in order to demo improved community mobility.    Baseline 1.84 ft/sec with RW.    Time 4    Period Weeks    Status New               PT Long Term Goals - 11/05/20 0839       PT LONG TERM GOAL #1   Title BERG goal/higher level balance test goal to be written as appropriate. ALL LTGS DUE 12/31/20    Time 8    Period Weeks    Status New    Target Date 12/31/20      PT LONG TERM GOAL #2   Title Pt will improve gait speed to at least 2.8 ft/sec with LRAD vs. no AD in order to demo improved community mobility.    Baseline 1.84 ft/sec with RW    Time 8    Period Weeks    Status New      PT LONG TERM GOAL #3   Title Pt will ambulate at least 500' outdoors over paved/unlevel surfaces with supervision and LRAD/no AD in order to demo improved community mobility.    Time 8    Period Weeks    Status New      PT LONG TERM GOAL #4   Title Vestibular/mCTSIB goal to be written as appropriate.    Baseline not yet assessed.    Time 8    Period Weeks    Status New      PT LONG TERM GOAL #5   Title Pt will perform 16 steps using single handrail and step through pattern with supervision in order to be able to go up to the 2nd floor of her house.    Baseline unable to go upstairs    Time 8    Period Weeks    Status New                   Plan - 11/18/20 1629     Clinical Impression Statement Initiated gaze stabilization and  accomodation exercises, alternating eye occluded with patient tolerating very well. Overall mild increase in symptoms (dizziness/nausea) with right eye occluded and utilizing L eye but imrpoved with short rest break. Completed ambulation without vision occluded x 85 ft with no significant veering noted. Will continue to progress toward all LTGs.    Personal Factors and Comorbidities Comorbidity 3+;Past/Current Experience    Comorbidities hypothyroidism, hiatal hernia, spincter of Oddi dysfunciton, depression, and anxiety.    Examination-Activity Limitations Stairs;Transfers;Squat;Locomotion Level    Examination-Participation Restrictions Community Activity;Driving;Cleaning;Laundry   playing tennis   Stability/Clinical Decision Making Evolving/Moderate complexity    Rehab Potential Good    PT Frequency 2x / week    PT Duration 8 weeks    PT Treatment/Interventions ADLs/Self Care Home Management;Stair training;Gait training;DME Instruction;Functional mobility training;Therapeutic activities;Therapeutic exercise;Neuromuscular re-education;Balance training;Patient/family education;Vestibular;Visual/perceptual remediation/compensation    PT Next Visit Plan continue with coordinated eye and head movements and standing balance on unlevel surfaces/vision removed, habituation to head turns to target, gaze stabilization (VOR x 1 and  x 2 when able), visual motion exercises/tracking. accomodation exercises.    PT Home Exercise Plan [id number]    Consulted and Agree with Plan of Care Patient             Patient will benefit from skilled therapeutic intervention in order to improve the following deficits and impairments:  Abnormal gait, Decreased activity tolerance, Decreased balance, Dizziness, Decreased strength, Impaired vision/preception, Decreased endurance  Visit Diagnosis: Dizziness and giddiness  Other abnormalities of gait and mobility  Unsteadiness on feet     Problem List Patient Active  Problem List   Diagnosis Date Noted   Brain mass 10/21/2020   S/P resection of meningioma 10/21/2020   Chronic headaches 10/14/2020   Frontal mass of brain 10/13/2020   Elevated LFTs    Dilated cbd, acquired    Abdominal pain 12/06/2017   Hypothyroidism 12/06/2017   Night sweats 03/11/2015   S/P endometrial ablation 03/12/2013   Premenstrual syndrome 03/12/2013   Anemia 08/14/2012   Pelvic pain in female 08/13/2012   Menorrhagia     Jones Bales, PT, DPT 11/18/2020, 4:32 PM  [facility] [address] [place], [place], [address] Phone: [phone]   Fax:  [phone]  Name: CHAYLEE EHRSAM MRN: [id number] Date of Birth: 1970/08/17

## 2020-11-18 NOTE — Patient Instructions (Signed)
Diplopia HEP:  Perform at least 3 times per day. Stop if your eye becomes fatigued or hurts and try again later.  1. Hold a small object/card in front of you.  Hold it in the middle at arm's length away.    2. Cover your LEFT eye and look at the object with your RIGHT eye.  3. Slowly move the object side to side in front of you while continuing to watch it with your RIGHT eye.  4.  Remember to keep your head still and only move your eye.  5.  Repeat 5-10 times.  6.  Then, move object up and down while watching it 5-10 times.  7. Cover your RIGHT eye and look at the object with your LEFT eye while you repeat #1-6 above.  8.  Now, uncover both eyes and try to focus on the object while holding it in the middle.  Try to make it 1 image.   9.  If you can, try to hold it for 10-30 sec increasing as able.    10.  Once you can make the image 1 for at least 30 sec in the middle, repeat #1-6 above with both eyes moving slowly and only in the range that you can keep the image 1.  

## 2020-11-22 ENCOUNTER — Ambulatory Visit: Payer: 59 | Admitting: Physical Therapy

## 2020-11-22 ENCOUNTER — Encounter: Payer: 59 | Admitting: Occupational Therapy

## 2020-11-23 ENCOUNTER — Ambulatory Visit: Payer: 59 | Admitting: Physical Therapy

## 2020-11-23 ENCOUNTER — Ambulatory Visit: Payer: 59 | Admitting: Occupational Therapy

## 2020-11-23 ENCOUNTER — Other Ambulatory Visit: Payer: Self-pay

## 2020-11-23 ENCOUNTER — Encounter: Payer: Self-pay | Admitting: Physical Therapy

## 2020-11-23 ENCOUNTER — Encounter: Payer: Self-pay | Admitting: Occupational Therapy

## 2020-11-23 ENCOUNTER — Encounter: Payer: 59 | Admitting: Occupational Therapy

## 2020-11-23 VITALS — BP 124/84 | HR 82

## 2020-11-23 DIAGNOSIS — R29818 Other symptoms and signs involving the nervous system: Secondary | ICD-10-CM

## 2020-11-23 DIAGNOSIS — R41842 Visuospatial deficit: Secondary | ICD-10-CM

## 2020-11-23 DIAGNOSIS — R41840 Attention and concentration deficit: Secondary | ICD-10-CM

## 2020-11-23 DIAGNOSIS — R2681 Unsteadiness on feet: Secondary | ICD-10-CM

## 2020-11-23 DIAGNOSIS — R2689 Other abnormalities of gait and mobility: Secondary | ICD-10-CM

## 2020-11-23 DIAGNOSIS — R42 Dizziness and giddiness: Secondary | ICD-10-CM

## 2020-11-23 NOTE — Therapy (Addendum)
[facility] [address] [place], [place], [address] Phone: [phone]   Fax:  [phone]  Physical Therapy Treatment  Patient Details  Name: Lydia Jenkins MRN: [id number] Date of Birth: Jul 13, 1970 Referring Provider (PT): Vallarie Mare, MD   Encounter Date: 11/23/2020   PT End of Session - 11/23/20 1704     Visit Number 6    Number of Visits 17    Date for PT Re-Evaluation 01/03/21    Authorization Type UHC 2022    PT Start Time 1618    PT Stop Time 1701    PT Time Calculation (min) 43 min    Equipment Utilized During Treatment Gait belt    Activity Tolerance Patient tolerated treatment well    Behavior During Therapy WFL for tasks assessed/performed             Past Medical History:  Diagnosis Date   Abnormal Pap smear    Anemia    iron deficient   Anxiety    Breast disorder    precencerous lesion, marker placed   Complication of anesthesia    Depression    Family history of adverse reaction to anesthesia    Hiatal hernia    Hypothyroidism    Liver dysfunction    Menorrhagia    Migraine headache with aura    PONV (postoperative nausea and vomiting)     Past Surgical History:  Procedure Laterality Date   ABDOMINOPLASTY  3846   APPLICATION OF CRANIAL NAVIGATION N/A 10/21/2020   Procedure: APPLICATION OF CRANIAL NAVIGATION;  Surgeon: Vallarie Mare, MD;  Location: [facility];  Service: Neurosurgery;  Laterality: N/A;   AUGMENTATION MAMMAPLASTY Bilateral 2009   BILIARY STENT PLACEMENT N/A 12/10/2017   Procedure: BILIARY STENT PLACEMENT;  Surgeon: Carol Ada, MD;  Location: [facility];  Service: Endoscopy;  Laterality: N/A;  plastic 8.5 x 5 stent placed   BREAST BIOPSY Left 2010   precancerous bx   CRANIOTOMY Left 10/21/2020   Procedure: LEFT FRONTAL CRANIOTOMY FOR RESECTION OF MENINGIOMA;  Surgeon: Vallarie Mare, MD;  Location: [facility];  Service: Neurosurgery;  Laterality: Left;    CRYOTHERAPY     cervix-3x   ENDOMETRIAL ABLATION  2010   ERCP N/A 12/10/2017   Procedure: ENDOSCOPIC RETROGRADE CHOLANGIOPANCREATOGRAPHY (ERCP);  Surgeon: Carol Ada, MD;  Location: [facility];  Service: Endoscopy;  Laterality: N/A;  Balloon sweep of duct   LAPAROSCOPIC CHOLECYSTECTOMY  2008   SPHINCTEROTOMY  12/10/2017   Procedure: SPHINCTEROTOMY;  Surgeon: Carol Ada, MD;  Location: [facility];  Service: Endoscopy;;   TUBAL LIGATION  1998    Vitals:   11/23/20 1639  BP: 124/84  Pulse: 82     Subjective Assessment - 11/23/20 1619     Subjective Switched to the whole eye patch and reports it was bothering her incision. Took it off and brought it with her.    Pertinent History hypothyroidism, hiatal hernia, spincter of Oddi dysfunciton, depression, and anxiety.    Limitations Walking    Patient Stated Goals wants to be back normal (if not better) - played tennis 3-5x a week and was doing crossfit, likes hiking    Currently in Pain? Yes    Pain Score 5     Pain Location Head    Pain Orientation Left    Pain Descriptors / Indicators Headache;Tingling    Pain Type Neuropathic pain;Surgical pain  11/23/20 0001  Ambulation/Gait  Ambulation/Gait Yes  Ambulation/Gait Assistance 5: Supervision;4: Min guard  Ambulation/Gait Assistance Details completed gait with RW and without eye occluded 100' no nausea today but did need intermittent brief standing rest breaks, performed 115' with no AD and L eye occluded with no issues and remainder of short distance gait between activities with no AD  Ambulation Distance (Feet) 100 Feet (115' x 1)  Assistive device Rolling walker;None  Gait Pattern Step-through pattern  Ambulation Surface Level;Indoor  Therapeutic Activites   Therapeutic Activities Other Therapeutic Activities  Other Therapeutic Activities pt brought in her new eye patch that would be fully occluded with therapist  helping pt put it on as pt reports that when she tried it it felt too tight around her incision. no issues during session today. pt reporting difficulties at times with memory/cognition, this PT previously sent a message over to pt's physcian for speech therapy referral but has not heard back, discussed might be best for pt to call the office with pt reporting that she will do that tomorrow. if not, PT will send another referral request       Balance Exercises - 11/23/20 1658       Balance Exercises: Standing   Standing Eyes Opened Foam/compliant surface    Standing Eyes Opened Limitations feet hip width: 2 x 10 reps head turns, 2 x 10 reps head nods    Standing Eyes Closed Wide (BOA);Foam/compliant surface    Standing Eyes Closed Limitations on 2 pillows: 3 x30 seconds, mild postural sway, no dizziness    Tandem Gait Forward;3 reps;Limitations    Tandem Gait Limitations at countertop with pt gently looking up and down to see where foot is, incr difficulty with looking down    Marching Solid surface;Limitations    Marching Limitations down and back 3 reps, cues for slowed and controlled, with intermittent UE support                 PT Short Term Goals - 11/09/20 1655       PT SHORT TERM GOAL #1   Title Pt will be independent with initial HEP in order to build upon functional gains made in therapy. ALL STGS DUE 12/03/20.    Time 4    Period Weeks    Status New    Target Date 12/03/20      PT SHORT TERM GOAL #2   Title Pt will improve condition 4 of mCTSIB to at least 15 seconds in order to demo improved vestibular input for balance.    Baseline 6.3 seconds on 11/09/20    Time 4    Period Weeks    Status New      PT SHORT TERM GOAL #3   Title Pt will undergo further testing of BERG with STG/LTG written as appropriate.    Baseline 54/56 on 11/09/20, pt wearing glasses for double vision, would benefit from future assessment without glasses    Time 4    Period Weeks    Status  New      PT SHORT TERM GOAL #4   Title Pt will ambulate at least 63' with LRAD with supervision over indoor level surfaces in order to demo improved household mobility.    Time 4    Period Weeks    Status New      PT SHORT TERM GOAL #5   Title Pt will improve gait speed to at least 2.2 ft/sec with RW vs. LRAD in order to  demo improved community mobility.    Baseline 1.84 ft/sec with RW.    Time 4    Period Weeks    Status New               PT Long Term Goals - 11/05/20 0839       PT LONG TERM GOAL #1   Title BERG goal/higher level balance test goal to be written as appropriate. ALL LTGS DUE 12/31/20    Time 8    Period Weeks    Status New    Target Date 12/31/20      PT LONG TERM GOAL #2   Title Pt will improve gait speed to at least 2.8 ft/sec with LRAD vs. no AD in order to demo improved community mobility.    Baseline 1.84 ft/sec with RW    Time 8    Period Weeks    Status New      PT LONG TERM GOAL #3   Title Pt will ambulate at least 500' outdoors over paved/unlevel surfaces with supervision and LRAD/no AD in order to demo improved community mobility.    Time 8    Period Weeks    Status New      PT LONG TERM GOAL #4   Title Vestibular/mCTSIB goal to be written as appropriate.    Baseline not yet assessed.    Time 8    Period Weeks    Status New      PT LONG TERM GOAL #5   Title Pt will perform 16 steps using single handrail and step through pattern with supervision in order to be able to go up to the 2nd floor of her house.    Baseline unable to go upstairs    Time 8    Period Weeks    Status New                   Plan - 11/24/20 3151     Clinical Impression Statement Performed gait today with RW without eye occluded with pt reporting no nausea, but did need intermittent standing rest breaks due to visual deficits. Performed gait with no AD with L eye occluded with no issues. Remainder of session performed with L eye occluded, able to  progress balance with eyes closed and head motions on unlevel surfaces with no dizziness noted. Will continue to progress towards LTGs.    Personal Factors and Comorbidities Comorbidity 3+;Past/Current Experience    Comorbidities hypothyroidism, hiatal hernia, spincter of Oddi dysfunciton, depression, and anxiety.    Examination-Activity Limitations Stairs;Transfers;Squat;Locomotion Level    Examination-Participation Restrictions Community Activity;Driving;Cleaning;Laundry   playing tennis   Stability/Clinical Decision Making Evolving/Moderate complexity    Rehab Potential Good    PT Frequency 2x / week    PT Duration 8 weeks    PT Treatment/Interventions ADLs/Self Care Home Management;Stair training;Gait training;DME Instruction;Functional mobility training;Therapeutic activities;Therapeutic exercise;Neuromuscular re-education;Balance training;Patient/family education;Vestibular;Visual/perceptual remediation/compensation    PT Next Visit Plan continue with coordinated eye and head movements and standing balance on unlevel surfaces/vision removed, habituation to head turns to target, gaze stabilization (VOR x 1 and x 2 when able), visual motion exercises/tracking. accomodation exercises.    PT Home Exercise Plan [id number]    Consulted and Agree with Plan of Care Patient             Patient will benefit from skilled therapeutic intervention in order to improve the following deficits and impairments:  Abnormal gait, Decreased activity tolerance, Decreased balance, Dizziness, Decreased strength,  Impaired vision/preception, Decreased endurance  Visit Diagnosis: Dizziness and giddiness  Unsteadiness on feet  Other symptoms and signs involving the nervous system     Problem List Patient Active Problem List   Diagnosis Date Noted   Brain mass 10/21/2020   S/P resection of meningioma 10/21/2020   Chronic headaches 10/14/2020   Frontal mass of brain 10/13/2020   Elevated LFTs    Dilated  cbd, acquired    Abdominal pain 12/06/2017   Hypothyroidism 12/06/2017   Night sweats 03/11/2015   S/P endometrial ablation 03/12/2013   Premenstrual syndrome 03/12/2013   Anemia 08/14/2012   Pelvic pain in female 08/13/2012   Menorrhagia     Arliss Journey, PT, DPT  11/24/2020, 9:19 AM  [facility] [address] [place], [place], [address] Phone: [phone]   Fax:  [phone]  Name: HAYDEN MABIN MRN: [id number] Date of Birth: [birth year]

## 2020-11-23 NOTE — Therapy (Signed)
[facility] [address] [place], [place], [address] Phone: [phone]   Fax:  [phone]  Occupational Therapy Treatment  Patient Details  Name: Lydia Jenkins MRN: [id number] Date of Birth: Sep 09, 1970 Referring Provider (OT): Duffy Rhody, MD   Encounter Date: 11/23/2020   OT End of Session - 11/23/20 1658     Visit Number 5    Number of Visits 17    Date for OT Re-Evaluation 12/31/20    Authorization Type UHC    OT Start Time 1702    OT Stop Time 1745    OT Time Calculation (min) 43 min    Activity Tolerance Patient tolerated treatment well    Behavior During Therapy [facility] for tasks assessed/performed             Past Medical History:  Diagnosis Date   Abnormal Pap smear    Anemia    iron deficient   Anxiety    Breast disorder    precencerous lesion, marker placed   Complication of anesthesia    Depression    Family history of adverse reaction to anesthesia    Hiatal hernia    Hypothyroidism    Liver dysfunction    Menorrhagia    Migraine headache with aura    PONV (postoperative nausea and vomiting)     Past Surgical History:  Procedure Laterality Date   ABDOMINOPLASTY  7782   APPLICATION OF CRANIAL NAVIGATION N/A 10/21/2020   Procedure: APPLICATION OF CRANIAL NAVIGATION;  Surgeon: Vallarie Mare, MD;  Location: [place];  Service: Neurosurgery;  Laterality: N/A;   AUGMENTATION MAMMAPLASTY Bilateral 2009   BILIARY STENT PLACEMENT N/A 12/10/2017   Procedure: BILIARY STENT PLACEMENT;  Surgeon: Carol Ada, MD;  Location: [facility];  Service: Endoscopy;  Laterality: N/A;  plastic 8.5 x 5 stent placed   BREAST BIOPSY Left 2010   precancerous bx   CRANIOTOMY Left 10/21/2020   Procedure: LEFT FRONTAL CRANIOTOMY FOR RESECTION OF MENINGIOMA;  Surgeon: Vallarie Mare, MD;  Location: [place];  Service: Neurosurgery;  Laterality: Left;   CRYOTHERAPY     cervix-3x   ENDOMETRIAL ABLATION   2010   ERCP N/A 12/10/2017   Procedure: ENDOSCOPIC RETROGRADE CHOLANGIOPANCREATOGRAPHY (ERCP);  Surgeon: Carol Ada, MD;  Location: [facility];  Service: Endoscopy;  Laterality: N/A;  Balloon sweep of duct   LAPAROSCOPIC CHOLECYSTECTOMY  2008   SPHINCTEROTOMY  12/10/2017   Procedure: SPHINCTEROTOMY;  Surgeon: Carol Ada, MD;  Location: [facility];  Service: Endoscopy;;   TUBAL LIGATION  1998    There were no vitals filed for this visit.   Subjective Assessment - 11/23/20 1705     Subjective  I was in pain when I first got here and I had been in the house all day and I don't know if it was the heat or what    Pertinent History sphincter of oddi dysfunction, hiatal hernia, hypothyroidism, depression, anxiety    Limitations no driving    Patient Stated Goals "get back to what I was before if not better"    Currently in Pain? No/denies             Patching: patient has a new eye patch for her eye - still consistently wearing on left eye but instructed to alternate to allow for both eyes to work.   Small Pegs: pt completed near point copying of small peg design with little to no complaints of dizziness but no nausea. Pt completed with right  eye occluded.  Sorting Blink Cards with no occlusion with min to mod complaints of dizziness and no nausea. Sorted by shape with moderate difficulty with placing cards in correct place d/t diplopia.                      OT Short Term Goals - 11/16/20 1813       OT SHORT TERM GOAL #1   Title Pt will be independent with diplopia HEP    Time 4    Period Weeks    Status On-going    Target Date 12/03/20      OT SHORT TERM GOAL #2   Title Pt will perform environmental scanning with 90% Accuracy and no reports of diplopia with occlusion PRN.    Time 4    Period Weeks    Status On-going      OT SHORT TERM GOAL #3   Title Pt will be independent with occlusion schedule and recommendations per OT PRN    Time 4    Period  Weeks    Status On-going      OT SHORT TERM GOAL #4   Title Pt will perform simple warm meal prep and/or light housekeeping with no reports of diplopia with occlusion PRN and with mod I.    Time 4    Period Weeks    Status On-going               OT Long Term Goals - 11/16/20 1814       OT LONG TERM GOAL #1   Title Pt will be independent with updated HEPs    Time 8    Period Weeks    Status On-going      OT LONG TERM GOAL #2   Title Pt will perform environmental scanning with cognitive component with 95% accuracy or greater and with no reports of diplopia with occlusion only PRN.    Time 8    Period Weeks    Status On-going      OT LONG TERM GOAL #3   Title Pt will perform mod complex meal prep (i.e. following instructions on side of rice) with mod I and no reports of diplopia with occlusion PRN    Time 8    Period Weeks    Status On-going      OT LONG TERM GOAL #4   Title Pt will tolerate visual activity with no occlusion for 5 minutes or greater with no reports of disturbance/dizziness.    Time 8    Period Weeks    Status On-going                   Plan - 11/24/20 2947     Clinical Impression Statement Pt continues with significant diplopia however single vision area getting larger since last session.    OT Occupational Profile and History Problem Focused Assessment - Including review of records relating to presenting problem    Occupational performance deficits (Please refer to evaluation for details): ADL's;IADL's    Body Structure / Function / Physical Skills Vision;Mobility;GMC;Strength;IADL;ADL;Decreased knowledge of use of DME    Rehab Potential Good    Clinical Decision Making Limited treatment options, no task modification necessary    Comorbidities Affecting Occupational Performance: None    Modification or Assistance to Complete Evaluation  No modification of tasks or assist necessary to complete eval    OT Frequency 2x / week    OT Duration  8 weeks  may d/c early and/or reduce to 1 x a week depending on progress.   OT Treatment/Interventions Self-care/ADL training;DME and/or AE instruction;Visual/perceptual remediation/compensation;Patient/family education;Therapist, nutritional;Therapeutic exercise;Neuromuscular education;Therapeutic activities    Plan Diplopia compensation - taper patching/taping as able. work toward coordinated eye movement, needs head/eye movement acclimation    Consulted and Agree with Plan of Care Patient             Patient will benefit from skilled therapeutic intervention in order to improve the following deficits and impairments:   Body Structure / Function / Physical Skills: Vision, Mobility, GMC, Strength, IADL, ADL, Decreased knowledge of use of DME       Visit Diagnosis: Unsteadiness on feet  Visuospatial deficit  Attention and concentration deficit  Other abnormalities of gait and mobility  Other symptoms and signs involving the nervous system    Problem List Patient Active Problem List   Diagnosis Date Noted   Brain mass 10/21/2020   S/P resection of meningioma 10/21/2020   Chronic headaches 10/14/2020   Frontal mass of brain 10/13/2020   Elevated LFTs    Dilated cbd, acquired    Abdominal pain 12/06/2017   Hypothyroidism 12/06/2017   Night sweats 03/11/2015   S/P endometrial ablation 03/12/2013   Premenstrual syndrome 03/12/2013   Anemia 08/14/2012   Pelvic pain in female 08/13/2012   Menorrhagia     Zachery Conch MOT, OTR/L  11/24/2020, 9:54 AM  [facility] [address] [place], [place], [address] Phone: [phone]   Fax:  [phone]  Name: TRELLIS GUIRGUIS MRN: [id number] Date of Birth: 03-17-1971

## 2020-11-24 ENCOUNTER — Encounter: Payer: Self-pay | Admitting: Physical Therapy

## 2020-11-24 ENCOUNTER — Ambulatory Visit: Payer: 59 | Admitting: Physical Therapy

## 2020-11-24 ENCOUNTER — Ambulatory Visit: Payer: 59 | Admitting: Occupational Therapy

## 2020-11-24 ENCOUNTER — Encounter: Payer: Self-pay | Admitting: Occupational Therapy

## 2020-11-24 DIAGNOSIS — R2681 Unsteadiness on feet: Secondary | ICD-10-CM | POA: Diagnosis not present

## 2020-11-24 DIAGNOSIS — R29818 Other symptoms and signs involving the nervous system: Secondary | ICD-10-CM

## 2020-11-24 DIAGNOSIS — R42 Dizziness and giddiness: Secondary | ICD-10-CM

## 2020-11-24 DIAGNOSIS — R2689 Other abnormalities of gait and mobility: Secondary | ICD-10-CM

## 2020-11-24 DIAGNOSIS — R41842 Visuospatial deficit: Secondary | ICD-10-CM

## 2020-11-24 DIAGNOSIS — R41840 Attention and concentration deficit: Secondary | ICD-10-CM

## 2020-11-24 NOTE — Therapy (Signed)
[facility] [address] [place], [place], [address] Phone: [phone]   Fax:  [phone]  Physical Therapy Treatment  Patient Details  Name: Lydia Jenkins MRN: [id number] Date of Birth: August 29, 1970 Referring Provider (PT): Vallarie Mare, MD   Encounter Date: 11/24/2020   PT End of Session - 11/24/20 1613     Visit Number 7    Number of Visits 17    Date for PT Re-Evaluation 01/03/21    Authorization Type UHC 2022    PT Start Time 6759    PT Stop Time 1530    PT Time Calculation (min) 43 min    Equipment Utilized During Treatment Gait belt    Activity Tolerance Patient tolerated treatment well    Behavior During Therapy WFL for tasks assessed/performed             Past Medical History:  Diagnosis Date   Abnormal Pap smear    Anemia    iron deficient   Anxiety    Breast disorder    precencerous lesion, marker placed   Complication of anesthesia    Depression    Family history of adverse reaction to anesthesia    Hiatal hernia    Hypothyroidism    Liver dysfunction    Menorrhagia    Migraine headache with aura    PONV (postoperative nausea and vomiting)     Past Surgical History:  Procedure Laterality Date   ABDOMINOPLASTY  1638   APPLICATION OF CRANIAL NAVIGATION N/A 10/21/2020   Procedure: APPLICATION OF CRANIAL NAVIGATION;  Surgeon: Vallarie Mare, MD;  Location: [place];  Service: Neurosurgery;  Laterality: N/A;   AUGMENTATION MAMMAPLASTY Bilateral 2009   BILIARY STENT PLACEMENT N/A 12/10/2017   Procedure: BILIARY STENT PLACEMENT;  Surgeon: Carol Ada, MD;  [facility];  Service: Endoscopy;  Laterality: N/A;  plastic 8.5 x 5 stent placed   BREAST BIOPSY Left 2010   precancerous bx   CRANIOTOMY Left 10/21/2020   Procedure: LEFT FRONTAL CRANIOTOMY FOR RESECTION OF MENINGIOMA;  Surgeon: Vallarie Mare, MD;  Location: [place];  Service: Neurosurgery;  Laterality: Left;    CRYOTHERAPY     cervix-3x   ENDOMETRIAL ABLATION  2010   ERCP N/A 12/10/2017   Procedure: ENDOSCOPIC RETROGRADE CHOLANGIOPANCREATOGRAPHY (ERCP);  Surgeon: Carol Ada, MD;  Location: [facility];  Service: Endoscopy;  Laterality: N/A;  Balloon sweep of duct   LAPAROSCOPIC CHOLECYSTECTOMY  2008   SPHINCTEROTOMY  12/10/2017   Procedure: SPHINCTEROTOMY;  Surgeon: Carol Ada, MD;  Location: [facility];  Service: Endoscopy;;   TUBAL LIGATION  1998    There were no vitals filed for this visit.   Subjective Assessment - 11/24/20 1450     Subjective Saw her personal trainer this morning and did a lot of stretching and some other exercises. Got on an exercise bike at a very low resistance at her heart rate incr to the 160s. Took about 5 minutes to calm back down. Reports heart rate has been good the remainder of the day.    Pertinent History hypothyroidism, hiatal hernia, spincter of Oddi dysfunciton, depression, and anxiety.    Limitations Walking    Patient Stated Goals wants to be back normal (if not better) - played tennis 3-5x a week and was doing crossfit, likes hiking    Currently in Pain? Yes    Pain Score 2     Pain Location Head    Pain Orientation Left    Pain  Descriptors / Indicators Tingling;Headache    Pain Type Surgical pain;Neuropathic pain                               [facility] Adult PT Treatment/Exercise - 11/24/20 0001       Ambulation/Gait   Ambulation/Gait Yes    Ambulation/Gait Assistance 5: Supervision;4: Min guard    Ambulation/Gait Assistance Details pt ambulating small distances with R eye occluded with no AD with pt reporting dizziness of about 4/10    Ambulation Distance (Feet) 30 Feet   x2   Assistive device None    Gait Pattern Step-through pattern   more guarded   Ambulation Surface Level;Indoor             Vestibular Treatment/Exercise - 11/24/20 1456       Vestibular Treatment/Exercise   Vestibular Treatment Provided Gaze     Gaze Exercises X1 Viewing Horizontal;X1 Viewing Vertical      X1 Viewing Horizontal   Foot Position seated    Reps 2    Comments with L eye occluded: 2 x 30 seconds then x60 seconds; alternating which eye is occluded. then with R eye occluded; 2 x 30 seconds, feels more "off" and a little nausea using her L eye      X1 Viewing Vertical   Foot Position seated    Reps 2    Comments x 30 seconds with each eye; no dizziness with left eye occluded and completing with R eye. With R eye occluded mild nausea when performing                Balance Exercises - 11/24/20 1528       Balance Exercises: Standing   Standing Eyes Opened Narrow base of support (BOS)    Standing Eyes Opened Limitations with R eye occluded: on level ground with feet together: 2 x 5 reps head turns (mild dizziness), 2 x 5 reps head nods. on blue foam beam with eyes open 2 x 30 seconds with mild postural sway.    Sidestepping Limitations    Sidestepping Limitations down and back 3 reps on blue foam beam, with fingertip support and then no UE support    Marching Foam/compliant surface;Intermittent upper extremity assist;Static;10 reps    Marching Limitations static marching on blue air ex with intermittent UE support, x10 reps each side             Access Code: [email or web address] URL: [email or web address] Date: 11/24/2020 Prepared by: Janann August  Exercises Seated LEFT AND RIGHT Head Turns - 2 x daily - 5 x weekly - 2-3 sets - 3 reps Standing Balance with Eyes Closed on Foam - 1-2 x daily - 5 x weekly - 3 sets - 15 hold Romberg Stance on Foam Pad - 1-2 x daily - 5 x weekly - 3 sets - 30 hold Wide Stance with Head Nods and Counter Support - 1-2 x daily - 5 x weekly - 2-3 sets - 5 reps  New addition to Hep on 11/24/20  Seated Gaze Stabilization with Head Nod - 1 x daily - 5 x weekly - 2 sets - 30 hold - also performing with head turns, alternating which eye is occluded   PT Education - 11/24/20  1612     Education Details seated VOR additions to HEP    Person(s) Educated Patient    Methods Explanation;Demonstration;Handout    Comprehension Verbalized understanding;Returned demonstration  PT Short Term Goals - 11/09/20 1655       PT SHORT TERM GOAL #1   Title Pt will be independent with initial HEP in order to build upon functional gains made in therapy. ALL STGS DUE 12/03/20.    Time 4    Period Weeks    Status New    Target Date 12/03/20      PT SHORT TERM GOAL #2   Title Pt will improve condition 4 of mCTSIB to at least 15 seconds in order to demo improved vestibular input for balance.    Baseline 6.3 seconds on 11/09/20    Time 4    Period Weeks    Status New      PT SHORT TERM GOAL #3   Title Pt will undergo further testing of BERG with STG/LTG written as appropriate.    Baseline 54/56 on 11/09/20, pt wearing glasses for double vision, would benefit from future assessment without glasses    Time 4    Period Weeks    Status New      PT SHORT TERM GOAL #4   Title Pt will ambulate at least 30' with LRAD with supervision over indoor level surfaces in order to demo improved household mobility.    Time 4    Period Weeks    Status New      PT SHORT TERM GOAL #5   Title Pt will improve gait speed to at least 2.2 ft/sec with RW vs. LRAD in order to demo improved community mobility.    Baseline 1.84 ft/sec with RW.    Time 4    Period Weeks    Status New               PT Long Term Goals - 11/05/20 0839       PT LONG TERM GOAL #1   Title BERG goal/higher level balance test goal to be written as appropriate. ALL LTGS DUE 12/31/20    Time 8    Period Weeks    Status New    Target Date 12/31/20      PT LONG TERM GOAL #2   Title Pt will improve gait speed to at least 2.8 ft/sec with LRAD vs. no AD in order to demo improved community mobility.    Baseline 1.84 ft/sec with RW    Time 8    Period Weeks    Status New      PT LONG TERM GOAL #3    Title Pt will ambulate at least 500' outdoors over paved/unlevel surfaces with supervision and LRAD/no AD in order to demo improved community mobility.    Time 8    Period Weeks    Status New      PT LONG TERM GOAL #4   Title Vestibular/mCTSIB goal to be written as appropriate.    Baseline not yet assessed.    Time 8    Period Weeks    Status New      PT LONG TERM GOAL #5   Title Pt will perform 16 steps using single handrail and step through pattern with supervision in order to be able to go up to the 2nd floor of her house.    Baseline unable to go upstairs    Time 8    Period Weeks    Status New                  Plan - 11/24/20 1615     Clinical Impression  Statement Continued to perform gaze stabilization exercises with VOR x1 in sitting. When using L eye with R eye occluded, pt reporting mild nausea and a "funny" feeling but decr with rest break. No sx when L eye occluded. Added to HEP. Remainder of session performed with R eye occluded with pt reporting 4/10 dizziness with gait with no AD and no dizziness with balance activities with head motions/on unlevel surfaces. Will continue to progress towards LTGs.   ?    Personal Factors and Comorbidities Comorbidity 3+;Past/Current Experience    Comorbidities hypothyroidism, hiatal hernia, spincter of Oddi dysfunciton, depression, and anxiety.    Examination-Activity Limitations Stairs;Transfers;Squat;Locomotion Level    Examination-Participation Restrictions Community Activity;Driving;Cleaning;Laundry   playing tennis   Stability/Clinical Decision Making Evolving/Moderate complexity    Rehab Potential Good    PT Frequency 2x / week    PT Duration 8 weeks    PT Treatment/Interventions ADLs/Self Care Home Management;Stair training;Gait training;DME Instruction;Functional mobility training;Therapeutic activities;Therapeutic exercise;Neuromuscular re-education;Balance training;Patient/family  education;Vestibular;Visual/perceptual remediation/compensation    PT Next Visit Plan continue with coordinated eye and head movements and standing balance on unlevel surfaces/vision removed, habituation to head turns to target, gaze stabilization (VOR x 1 and x 2 when able), visual motion exercises/tracking. accomodation exercises. try having R eye occluded vs. no eyes occluded.    PT Home Exercise Plan [email or web address]    Consulted and Agree with Plan of Care Patient             Patient will benefit from skilled therapeutic intervention in order to improve the following deficits and impairments:  Abnormal gait, Decreased activity tolerance, Decreased balance, Dizziness, Decreased strength, Impaired vision/preception, Decreased endurance  Visit Diagnosis: Other abnormalities of gait and mobility  Unsteadiness on feet  Dizziness and giddiness     Problem List Patient Active Problem List   Diagnosis Date Noted   Brain mass 10/21/2020   S/P resection of meningioma 10/21/2020   Chronic headaches 10/14/2020   Frontal mass of brain 10/13/2020   Elevated LFTs    Dilated cbd, acquired    Abdominal pain 12/06/2017   Hypothyroidism 12/06/2017   Night sweats 03/11/2015   S/P endometrial ablation 03/12/2013   Premenstrual syndrome 03/12/2013   Anemia 08/14/2012   Pelvic pain in female 08/13/2012   Menorrhagia     Arliss Journey, PT, DPT  11/24/2020, 4:15 PM  [facility] [address] [place], [place], [address] Phone: [phone]   Fax:  [phone]  Name: Lydia Jenkins MRN: [id number] Date of Birth: [birth year]

## 2020-11-24 NOTE — Therapy (Signed)
[facility] [address] [place], [place], [address] Phone: [phone]   Fax:  [phone]  Occupational Therapy Treatment  Patient Details  Name: Lydia Jenkins MRN: [id number] Date of Birth: [birth year] Referring Provider (OT): Duffy Rhody, MD   Encounter Date: 11/24/2020   OT End of Session - 11/24/20 1539     Visit Number 6    Number of Visits 17    Date for OT Re-Evaluation 12/31/20    Authorization Type U[employer]    OT Start Time 1536    OT Stop Time 2979    OT Time Calculation (min) 39 min    Activity Tolerance Patient tolerated treatment well    Behavior During Therapy [facility] for tasks assessed/performed             Past Medical History:  Diagnosis Date   Abnormal Pap smear    Anemia    iron deficient   Anxiety    Breast disorder    precencerous lesion, marker placed   Complication of anesthesia    Depression    Family history of adverse reaction to anesthesia    Hiatal hernia    Hypothyroidism    Liver dysfunction    Menorrhagia    Migraine headache with aura    PONV (postoperative nausea and vomiting)     Past Surgical History:  Procedure Laterality Date   ABDOMINOPLASTY  8921   APPLICATION OF CRANIAL NAVIGATION N/A 10/21/2020   Procedure: APPLICATION OF CRANIAL NAVIGATION;  Surgeon: Vallarie Mare, MD;  Location: [place];  Service: Neurosurgery;  Laterality: N/A;   AUGMENTATION MAMMAPLASTY Bilateral 2009   BILIARY STENT PLACEMENT N/A 12/10/2017   Procedure: BILIARY STENT PLACEMENT;  Surgeon: Carol Ada, MD;  Location: [facility];  Service: Endoscopy;  Laterality: N/A;  plastic 8.5 x 5 stent placed   BREAST BIOPSY Left 2010   precancerous bx   CRANIOTOMY Left 10/21/2020   Procedure: LEFT FRONTAL CRANIOTOMY FOR RESECTION OF MENINGIOMA;  Surgeon: Vallarie Mare, MD;  Location: [facility];  Service: Neurosurgery;  Laterality: Left;   CRYOTHERAPY     cervix-3x   ENDOMETRIAL ABLATION   2010   ERCP N/A 12/10/2017   Procedure: ENDOSCOPIC RETROGRADE CHOLANGIOPANCREATOGRAPHY (ERCP);  Surgeon: Carol Ada, MD;  Location: [facility];  Service: Endoscopy;  Laterality: N/A;  Balloon sweep of duct   LAPAROSCOPIC CHOLECYSTECTOMY  2008   SPHINCTEROTOMY  12/10/2017   Procedure: SPHINCTEROTOMY;  Surgeon: Carol Ada, MD;  Location: [facility];  Service: Endoscopy;;   TUBAL LIGATION  1998    There were no vitals filed for this visit.   Subjective Assessment - 11/24/20 1538     Subjective  I have a new patch and it is so much better    Pertinent History sphincter of oddi dysfunction, hiatal hernia, hypothyroidism, depression, anxiety    Limitations no driving    Patient Stated Goals "get back to what I was before if not better"    Currently in Pain? No/denies             The [employer] with one eye occluded (alternated) and working on vertical eye movements and then on lateral movements. Pt with some dizziness with right eye occluded and working with left eye but with sudden head turns. Did with no occlusions for short period with no dizziness but moderate reports of diplopia. Max drops with vertical with no occlusions  Resistance Clothespins for eye and minimal head turns with no occlusion. Diplopia present and  would frequently miss antenna and reaching for the clothespins. Occluded right eye for returning clothespins to the pole.                       OT Short Term Goals - 11/16/20 1813       OT SHORT TERM GOAL #1   Title Pt will be independent with diplopia HEP    Time 4    Period Weeks    Status On-going    Target Date 12/03/20      OT SHORT TERM GOAL #2   Title Pt will perform environmental scanning with 90% Accuracy and no reports of diplopia with occlusion PRN.    Time 4    Period Weeks    Status On-going      OT SHORT TERM GOAL #3   Title Pt will be independent with occlusion schedule and recommendations per OT PRN    Time 4     Period Weeks    Status On-going      OT SHORT TERM GOAL #4   Title Pt will perform simple warm meal prep and/or light housekeeping with no reports of diplopia with occlusion PRN and with mod I.    Time 4    Period Weeks    Status On-going               OT Long Term Goals - 11/16/20 1814       OT LONG TERM GOAL #1   Title Pt will be independent with updated HEPs    Time 8    Period Weeks    Status On-going      OT LONG TERM GOAL #2   Title Pt will perform environmental scanning with cognitive component with 95% accuracy or greater and with no reports of diplopia with occlusion only PRN.    Time 8    Period Weeks    Status On-going      OT LONG TERM GOAL #3   Title Pt will perform mod complex meal prep (i.e. following instructions on side of rice) with mod I and no reports of diplopia with occlusion PRN    Time 8    Period Weeks    Status On-going      OT LONG TERM GOAL #4   Title Pt will tolerate visual activity with no occlusion for 5 minutes or greater with no reports of disturbance/dizziness.    Time 8    Period Weeks    Status On-going                   Plan - 11/24/20 1611     Clinical Impression Statement Pt with better tolerance each session of no occlusion.    OT Occupational Profile and History Problem Focused Assessment - Including review of records relating to presenting problem    Occupational performance deficits (Please refer to evaluation for details): ADL's;IADL's    Body Structure / Function / Physical Skills Vision;Mobility;GMC;Strength;IADL;ADL;Decreased knowledge of use of DME    Rehab Potential Good    Clinical Decision Making Limited treatment options, no task modification necessary    Comorbidities Affecting Occupational Performance: None    Modification or Assistance to Complete Evaluation  No modification of tasks or assist necessary to complete eval    OT Frequency 2x / week    OT Duration 8 weeks   may d/c early and/or reduce  to 1 x a week depending on progress.   OT Treatment/Interventions  Self-care/ADL training;DME and/or AE instruction;Visual/perceptual remediation/compensation;Patient/family education;Therapist, nutritional;Therapeutic exercise;Neuromuscular education;Therapeutic activities    Plan Diplopia compensation - taper patching/taping as able. work toward coordinated eye movement, needs head/eye movement acclimation    Consulted and Agree with Plan of Care Patient             Patient will benefit from skilled therapeutic intervention in order to improve the following deficits and impairments:   Body Structure / Function / Physical Skills: Vision, Mobility, GMC, Strength, IADL, ADL, Decreased knowledge of use of DME       Visit Diagnosis: Unsteadiness on feet  Attention and concentration deficit  Visuospatial deficit  Other abnormalities of gait and mobility  Dizziness and giddiness  Other symptoms and signs involving the nervous system    Problem List Patient Active Problem List   Diagnosis Date Noted   Brain mass 10/21/2020   S/P resection of meningioma 10/21/2020   Chronic headaches 10/14/2020   Frontal mass of brain 10/13/2020   Elevated LFTs    Dilated cbd, acquired    Abdominal pain 12/06/2017   Hypothyroidism 12/06/2017   Night sweats 03/11/2015   S/P endometrial ablation 03/12/2013   Premenstrual syndrome 03/12/2013   Anemia 08/14/2012   Pelvic pain in female 08/13/2012   Menorrhagia     Zachery Conch MOT, OTR/L  11/24/2020, 4:11 PM  [facility] [address] [place], [place], [address] Phone: [phone]   Fax:  [phone]  Name: KYLEEN VILLATORO MRN: [id number] Date of Birth: Aug 20, 1970

## 2020-11-25 ENCOUNTER — Ambulatory Visit: Payer: 59

## 2020-11-25 ENCOUNTER — Encounter: Payer: 59 | Admitting: Occupational Therapy

## 2020-11-30 ENCOUNTER — Encounter: Payer: 59 | Admitting: Occupational Therapy

## 2020-11-30 ENCOUNTER — Ambulatory Visit: Payer: 59 | Admitting: Physical Therapy

## 2020-12-01 ENCOUNTER — Ambulatory Visit: Payer: 59 | Admitting: Physical Therapy

## 2020-12-01 ENCOUNTER — Ambulatory Visit: Payer: 59 | Attending: Family Medicine | Admitting: Occupational Therapy

## 2020-12-01 ENCOUNTER — Other Ambulatory Visit: Payer: Self-pay

## 2020-12-01 DIAGNOSIS — R29818 Other symptoms and signs involving the nervous system: Secondary | ICD-10-CM | POA: Diagnosis present

## 2020-12-01 DIAGNOSIS — R41842 Visuospatial deficit: Secondary | ICD-10-CM | POA: Insufficient documentation

## 2020-12-01 DIAGNOSIS — R42 Dizziness and giddiness: Secondary | ICD-10-CM | POA: Insufficient documentation

## 2020-12-01 DIAGNOSIS — R41840 Attention and concentration deficit: Secondary | ICD-10-CM | POA: Insufficient documentation

## 2020-12-01 DIAGNOSIS — R2689 Other abnormalities of gait and mobility: Secondary | ICD-10-CM | POA: Insufficient documentation

## 2020-12-01 DIAGNOSIS — R41841 Cognitive communication deficit: Secondary | ICD-10-CM | POA: Diagnosis present

## 2020-12-01 DIAGNOSIS — R2681 Unsteadiness on feet: Secondary | ICD-10-CM | POA: Insufficient documentation

## 2020-12-01 NOTE — Therapy (Signed)
[facility] [address] [place], [place], [address] Phone: [phone]   Fax:  [phone]  Occupational Therapy Treatment  Patient Details  Name: Lydia Jenkins MRN: [id number] Date of Birth: 21-Feb-1971 Referring Provider (OT): Duffy Rhody, MD   Encounter Date: 12/01/2020   OT End of Session - 12/01/20 1213     Visit Number 7    Number of Visits 17    Date for OT Re-Evaluation 12/31/20    Authorization Type UHC    OT Start Time 1150    OT Stop Time 1230    OT Time Calculation (min) 40 min    Activity Tolerance Patient tolerated treatment well    Behavior During Therapy [facility] for tasks assessed/performed             Past Medical History:  Diagnosis Date   Abnormal Pap smear    Anemia    iron deficient   Anxiety    Breast disorder    precencerous lesion, marker placed   Complication of anesthesia    Depression    Family history of adverse reaction to anesthesia    Hiatal hernia    Hypothyroidism    Liver dysfunction    Menorrhagia    Migraine headache with aura    PONV (postoperative nausea and vomiting)     Past Surgical History:  Procedure Laterality Date   ABDOMINOPLASTY  4818   APPLICATION OF CRANIAL NAVIGATION N/A 10/21/2020   Procedure: APPLICATION OF CRANIAL NAVIGATION;  Surgeon: Vallarie Mare, MD;  Location: [facility];  Service: Neurosurgery;  Laterality: N/A;   AUGMENTATION MAMMAPLASTY Bilateral 2009   BILIARY STENT PLACEMENT N/A 12/10/2017   Procedure: BILIARY STENT PLACEMENT;  Surgeon: Carol Ada, MD;  Location: [facility];  Service: Endoscopy;  Laterality: N/A;  plastic 8.5 x 5 stent placed   BREAST BIOPSY Left 2010   precancerous bx   CRANIOTOMY Left 10/21/2020   Procedure: LEFT FRONTAL CRANIOTOMY FOR RESECTION OF MENINGIOMA;  Surgeon: Vallarie Mare, MD;  Location: [facility];  Service: Neurosurgery;  Laterality: Left;   CRYOTHERAPY     cervix-3x   ENDOMETRIAL ABLATION  2010    ERCP N/A 12/10/2017   Procedure: ENDOSCOPIC RETROGRADE CHOLANGIOPANCREATOGRAPHY (ERCP);  Surgeon: Carol Ada, MD;  Location: [facility];  Service: Endoscopy;  Laterality: N/A;  Balloon sweep of duct   LAPAROSCOPIC CHOLECYSTECTOMY  2008   SPHINCTEROTOMY  12/10/2017   Procedure: SPHINCTEROTOMY;  Surgeon: Carol Ada, MD;  Location: [facility];  Service: Endoscopy;;   TUBAL LIGATION  1998    There were no vitals filed for this visit.   Subjective Assessment - 12/01/20 1151     Subjective  [place] and had pain in my head from the altitude and heat - I think I finally feel the surgery effects    Pertinent History sphincter of oddi dysfunction, hiatal hernia, hypothyroidism, depression, anxiety    Limitations no driving    Patient Stated Goals "get back to what I was before if not better"    Currently in Pain? Yes    Pain Score 3     Pain Location Head    Pain Orientation Anterior;Left    Pain Type Surgical pain;Neuropathic pain    Pain Frequency Intermittent    Aggravating Factors  heat, altitude    Pain Relieving Factors cold             Pt unable to tolerate much time with no occlusion  due to dizziness, over stimulation, lighting, etc. Pt however encouraged to alternate patch more frequently to encourage equal use/time for each eye as pt has been recently covering Lt eye more. Pt cautioned to cover Lt eye however when ambulating I'ly for safety reasons.   Tabletop scanning: letter cancellation (24M print size) 2 pages w/ only 1 omission, Rt eye covered.  Ambulating w/ walker and CGA using gait belt w/ Rt eye covered.  Standing with back against counter for support/balance with head turns horizontally, vertically, and diagonally w/ Rt eye covered calling out cards in each direction.                        OT Short Term Goals - 11/16/20 1813       OT SHORT TERM GOAL #1   Title Pt will be independent with diplopia HEP     Time 4    Period Weeks    Status On-going    Target Date 12/03/20      OT SHORT TERM GOAL #2   Title Pt will perform environmental scanning with 90% Accuracy and no reports of diplopia with occlusion PRN.    Time 4    Period Weeks    Status On-going      OT SHORT TERM GOAL #3   Title Pt will be independent with occlusion schedule and recommendations per OT PRN    Time 4    Period Weeks    Status On-going      OT SHORT TERM GOAL #4   Title Pt will perform simple warm meal prep and/or light housekeeping with no reports of diplopia with occlusion PRN and with mod I.    Time 4    Period Weeks    Status On-going               OT Long Term Goals - 11/16/20 1814       OT LONG TERM GOAL #1   Title Pt will be independent with updated HEPs    Time 8    Period Weeks    Status On-going      OT LONG TERM GOAL #2   Title Pt will perform environmental scanning with cognitive component with 95% accuracy or greater and with no reports of diplopia with occlusion only PRN.    Time 8    Period Weeks    Status On-going      OT LONG TERM GOAL #3   Title Pt will perform mod complex meal prep (i.e. following instructions on side of rice) with mod I and no reports of diplopia with occlusion PRN    Time 8    Period Weeks    Status On-going      OT LONG TERM GOAL #4   Title Pt will tolerate visual activity with no occlusion for 5 minutes or greater with no reports of disturbance/dizziness.    Time 8    Period Weeks    Status On-going                   Plan - 12/01/20 1214     Clinical Impression Statement Pt encouraged to alternate patch more frequently when seated. Pt mentally fatigues quickly    OT Occupational Profile and History Problem Focused Assessment - Including review of records relating to presenting problem    Occupational performance deficits (Please refer to evaluation for details): ADL's;IADL's    Body Structure / Function / Physical Skills  Vision;Mobility;GMC;Strength;IADL;ADL;Decreased knowledge of use of DME    Rehab Potential Good    Clinical Decision Making Limited treatment options, no task modification necessary    Comorbidities Affecting Occupational Performance: None    Modification or Assistance to Complete Evaluation  No modification of tasks or assist necessary to complete eval    OT Frequency 2x / week    OT Duration 8 weeks   may d/c early and/or reduce to 1 x a week depending on progress.   OT Treatment/Interventions Self-care/ADL training;DME and/or AE instruction;Visual/perceptual remediation/compensation;Patient/family education;Therapist, nutritional;Therapeutic exercise;Neuromuscular education;Therapeutic activities    Plan Diplopia compensation - taper patching/taping as able (maybe with readers). work toward coordinated eye movement, needs head/eye movement acclimation    Consulted and Agree with Plan of Care Patient             Patient will benefit from skilled therapeutic intervention in order to improve the following deficits and impairments:   Body Structure / Function / Physical Skills: Vision, Mobility, GMC, Strength, IADL, ADL, Decreased knowledge of use of DME       Visit Diagnosis: Visuospatial deficit  Unsteadiness on feet    Problem List Patient Active Problem List   Diagnosis Date Noted   Brain mass 10/21/2020   S/P resection of meningioma 10/21/2020   Chronic headaches 10/14/2020   Frontal mass of brain 10/13/2020   Elevated LFTs    Dilated cbd, acquired    Abdominal pain 12/06/2017   Hypothyroidism 12/06/2017   Night sweats 03/11/2015   S/P endometrial ablation 03/12/2013   Premenstrual syndrome 03/12/2013   Anemia 08/14/2012   Pelvic pain in female 08/13/2012   Menorrhagia     Carey Bullocks, OTR/L 12/01/2020, 12:20 PM  [facility] [address] [place], [place], [address] Phone: [phone]    Fax:  [phone]  Name: Lydia Jenkins MRN: [id number] Date of Birth: [birth year]

## 2020-12-02 ENCOUNTER — Encounter: Payer: 59 | Admitting: Occupational Therapy

## 2020-12-02 ENCOUNTER — Ambulatory Visit: Payer: 59

## 2020-12-07 ENCOUNTER — Encounter: Payer: Self-pay | Admitting: Occupational Therapy

## 2020-12-07 ENCOUNTER — Ambulatory Visit: Payer: 59 | Admitting: Physical Therapy

## 2020-12-07 ENCOUNTER — Other Ambulatory Visit: Payer: Self-pay

## 2020-12-07 ENCOUNTER — Ambulatory Visit: Payer: 59 | Admitting: Occupational Therapy

## 2020-12-07 ENCOUNTER — Encounter: Payer: Self-pay | Admitting: Physical Therapy

## 2020-12-07 DIAGNOSIS — R29818 Other symptoms and signs involving the nervous system: Secondary | ICD-10-CM

## 2020-12-07 DIAGNOSIS — R2681 Unsteadiness on feet: Secondary | ICD-10-CM

## 2020-12-07 DIAGNOSIS — R41840 Attention and concentration deficit: Secondary | ICD-10-CM

## 2020-12-07 DIAGNOSIS — R2689 Other abnormalities of gait and mobility: Secondary | ICD-10-CM

## 2020-12-07 DIAGNOSIS — R41842 Visuospatial deficit: Secondary | ICD-10-CM

## 2020-12-07 NOTE — Therapy (Signed)
[facility] [address] [place], [place], [address] Phone: [phone]   Fax:  [phone]  Physical Therapy Treatment  Patient Details  Name: Lydia Jenkins MRN: [id number] Date of Birth: 10/13/1970 Referring Provider (PT): Vallarie Mare, MD   Encounter Date: 12/07/2020   PT End of Session - 12/07/20 2042     Visit Number 8    Number of Visits 17    Date for PT Re-Evaluation 01/03/21    Authorization Type [employer] 2022    PT Start Time 9794    PT Stop Time 8016    PT Time Calculation (min) 45 min    Equipment Utilized During Treatment Gait belt    Activity Tolerance Patient tolerated treatment well    Behavior During Therapy WFL for tasks assessed/performed             Past Medical History:  Diagnosis Date   Abnormal Pap smear    Anemia    iron deficient   Anxiety    Breast disorder    precencerous lesion, marker placed   Complication of anesthesia    Depression    Family history of adverse reaction to anesthesia    Hiatal hernia    Hypothyroidism    Liver dysfunction    Menorrhagia    Migraine headache with aura    PONV (postoperative nausea and vomiting)     Past Surgical History:  Procedure Laterality Date   ABDOMINOPLASTY  5537   APPLICATION OF CRANIAL NAVIGATION N/A 10/21/2020   Procedure: APPLICATION OF CRANIAL NAVIGATION;  Surgeon: Vallarie Mare, MD;  Location: [facility];  Service: Neurosurgery;  Laterality: N/A;   AUGMENTATION MAMMAPLASTY Bilateral 2009   BILIARY STENT PLACEMENT N/A 12/10/2017   Procedure: BILIARY STENT PLACEMENT;  Surgeon: Carol Ada, MD;  Location: [facility];  Service: Endoscopy;  Laterality: N/A;  plastic 8.5 x 5 stent placed   BREAST BIOPSY Left 2010   precancerous bx   CRANIOTOMY Left 10/21/2020   Procedure: LEFT FRONTAL CRANIOTOMY FOR RESECTION OF MENINGIOMA;  Surgeon: Vallarie Mare, MD;  Location: [facility];  Service: Neurosurgery;  Laterality: Left;    CRYOTHERAPY     cervix-3x   ENDOMETRIAL ABLATION  2010   ERCP N/A 12/10/2017   Procedure: ENDOSCOPIC RETROGRADE CHOLANGIOPANCREATOGRAPHY (ERCP);  Surgeon: Carol Ada, MD;  Location: [facility];  Service: Endoscopy;  Laterality: N/A;  Balloon sweep of duct   LAPAROSCOPIC CHOLECYSTECTOMY  2008   SPHINCTEROTOMY  12/10/2017   Procedure: SPHINCTEROTOMY;  Surgeon: Carol Ada, MD;  Location: [facility];  Service: Endoscopy;;   TUBAL LIGATION  1998    There were no vitals filed for this visit.   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 Waiting to hear back from her surgeon to see if she can go on a plane for her vacation at the end of July.    Pertinent History hypothyroidism, hiatal hernia, spincter of Oddi dysfunciton, depression, and anxiety.    Limitations Walking    Patient Stated Goals wants to be back normal (if not better) - played tennis 3-5x a week and was doing crossfit, likes hiking    Currently in Pain? No/denies                               [facility] Adult PT Treatment/Exercise - 12/07/20 1629       Ambulation/Gait  Ambulation/Gait Yes    Ambulation/Gait Assistance 5: Supervision    Ambulation/Gait Assistance Details initial cues to relax BUE with gait with no AD, pt wearing patch over R eye, pt initially ambulating more cautiously but improved with incr distance and able to incr gait speed.    Ambulation Distance (Feet) 115 Feet   plus distances throughout session   Assistive device None    Gait Pattern Step-through pattern   more guarded initially   Ambulation Surface Level;Indoor    Gait velocity 9.19 = 3.56 ft/sec   with R eye covered     Neuro Re-ed    Neuro Re-ed Details  With R eye occluded with eye patch, NMR: standing on level ground- grabbing bean bag with either LUE/RUE to pt's L in different directions with pt having to turn head/scan environment to find bean bag x15 reps initially  needed to reset when returning back to midline before tossing but improved with incr reps then performed with pt standing on blue mat x15 reps with pt making movement more dynamic by also taking a step to throw bean bag into crate, alternating SLS taps to 2 cones and then cross body taps x10 reps with cues for tapping toe and for slowed/controlled movements. next to countertop; slow gait with head turns down and back x2 reps, then with head nods down and back x2 reps - pt beginning with slowed gait speed and more shuffled pattern but improving with incr reps and more step through fluid pattern.                 Balance Exercises - 12/07/20 2046       Balance Exercises: Standing   Standing Eyes Opened Narrow base of support (BOS)    Standing Eyes Opened Limitations on blue air ex feet together with R eye occluded 3 x 30 seconds    Standing Eyes Closed Narrow base of support (BOS);Foam/compliant surface    Standing Eyes Closed Limitations 3 x 30 seconds, mild postural sway on blue air ex               PT Education - 12/07/20 2041     Education Details progress towards goals.    Person(s) Educated Patient    Methods Explanation    Comprehension Verbalized understanding              PT Short Term Goals - 12/07/20 1637       PT SHORT TERM GOAL #1   Title Pt will be independent with initial HEP in order to build upon functional gains made in therapy. ALL STGS DUE 12/03/20.    Baseline met on 12/07/20    Time 4    Period Weeks    Status Achieved    Target Date 12/03/20      PT SHORT TERM GOAL #2   Title Pt will improve condition 4 of mCTSIB to at least 15 seconds in order to demo improved vestibular input for balance.    Baseline 6.3 seconds on 11/09/20; 30 seconds on 12/07/20    Time 4    Period Weeks    Status Achieved      PT SHORT TERM GOAL #3   Title Pt will undergo further testing of BERG with STG/LTG written as appropriate.    Baseline 54/56 on 11/09/20, pt  wearing glasses for double vision, would benefit from future assessment without glasses    Time 4    Period Weeks    Status Achieved  PT SHORT TERM GOAL #4   Title Pt will ambulate at least 42' with LRAD with supervision over indoor level surfaces in order to demo improved household mobility.    Baseline met with no AD.    Time 4    Period Weeks    Status Achieved      PT SHORT TERM GOAL #5   Title Pt will improve gait speed to at least 2.2 ft/sec with RW vs. LRAD in order to demo improved community mobility.    Baseline previously 1.84 ft/sec with RW, 9.19 = 3.56 ft/sec with no AD and R eye covered.    Time 4    Period Weeks    Status Achieved               PT Long Term Goals - 12/07/20 2045       PT LONG TERM GOAL #1   Title BERG goal/higher level balance test goal to be written as appropriate. ALL LTGS DUE 12/31/20    Time 8    Period Weeks    Status New      PT LONG TERM GOAL #2   Title Pt will improve gait speed to at least 3.9 ft/sec with no AD and no eye patch to demo improved community mobility.    Baseline 1.84 ft/sec with RW; 9.19 = 3.56 ft/sec R eye covered.    Time 8    Period Weeks    Status Revised      PT LONG TERM GOAL #3   Title Pt will ambulate at least 500' outdoors over paved/unlevel surfaces with supervision and LRAD/no AD in order to demo improved community mobility.    Time 8    Period Weeks    Status New      PT LONG TERM GOAL #4   Title Vestibular/mCTSIB goal to be written as appropriate.    Baseline not yet assessed.    Time 8    Period Weeks    Status New      PT LONG TERM GOAL #5   Title Pt will perform 16 steps using single handrail and step through pattern with supervision in order to be able to go up to the 2nd floor of her house.    Baseline unable to go upstairs    Time 8    Period Weeks    Status New                   Plan - 12/07/20 2051     Clinical Impression Statement Assessed pt's STGs today with pt  meeting all STGs. Performed entirety of session today with R eye occluded with patch and pt tolerated well. Pt improved gait speed with no AD to 3.56 ft/sec (previously was 1.84 ft/sec). Pt improved condition 4 of mCTSIB to 30 seconds (previously was 6.3 seconds), indicating incr vestibular input for balance. No episodes of dizziness throughout session. Pt is making great progress, will continue to progress towards LTGs.    Personal Factors and Comorbidities Comorbidity 3+;Past/Current Experience    Comorbidities hypothyroidism, hiatal hernia, spincter of Oddi dysfunciton, depression, and anxiety.    Examination-Activity Limitations Stairs;Transfers;Squat;Locomotion Level    Examination-Participation Restrictions Community Activity;Driving;Cleaning;Laundry   playing tennis   Stability/Clinical Decision Making Evolving/Moderate complexity    Rehab Potential Good    PT Frequency 2x / week    PT Duration 8 weeks    PT Treatment/Interventions ADLs/Self Care Home Management;Stair training;Gait training;DME Instruction;Functional mobility training;Therapeutic activities;Therapeutic exercise;Neuromuscular re-education;Balance training;Patient/family  education;Vestibular;Visual/perceptual remediation/compensation    PT Next Visit Plan perform stairs. continue with coordinated eye and head movements and standing balance on unlevel surfaces/vision removed,  gaze stabilization (VOR x 1 and x 2 when able), visual motion exercises/tracking. accomodation exercises. try having R eye occluded vs. no eyes occluded.    PT Home Exercise Plan [id number]    Consulted and Agree with Plan of Care Patient             Patient will benefit from skilled therapeutic intervention in order to improve the following deficits and impairments:  Abnormal gait, Decreased activity tolerance, Decreased balance, Dizziness, Decreased strength, Impaired vision/preception, Decreased endurance  Visit Diagnosis: Unsteadiness on  feet  Other abnormalities of gait and mobility  Other symptoms and signs involving the nervous system     Problem List Patient Active Problem List   Diagnosis Date Noted   Brain mass 10/21/2020   S/P resection of meningioma 10/21/2020   Chronic headaches 10/14/2020   Frontal mass of brain 10/13/2020   Elevated LFTs    Dilated cbd, acquired    Abdominal pain 12/06/2017   Hypothyroidism 12/06/2017   Night sweats 03/11/2015   S/P endometrial ablation 03/12/2013   Premenstrual syndrome 03/12/2013   Anemia 08/14/2012   Pelvic pain in female 08/13/2012   Menorrhagia     Lydia Jenkins, PT, DPT  12/07/2020, 8:52 PM  [facility] [address] [place], [place], [address] Phone: [phone]   Fax:  [phone]  Name: FUSAKO TANABE MRN: [id number] Date of Birth: [birth year]

## 2020-12-07 NOTE — Therapy (Signed)
[facility] [address] [place], [place], [address] Phone: [phone]   Fax:  [phone]  Occupational Therapy Treatment and Progress Update  Patient Details  Name: Lydia Jenkins MRN: [id number] Date of Birth: 07-30-1970 Referring Provider (OT): Duffy Rhody, MD   Encounter Date: 12/07/2020   OT End of Session - 12/07/20 1816     Visit Number 8    Number of Visits 17    Date for OT Re-Evaluation 12/31/20    Authorization Time Period [id number] No Auth    OT Start Time 1530    OT Stop Time 1615    OT Time Calculation (min) 45 min    Activity Tolerance Patient tolerated treatment well    Behavior During Therapy [facility] for tasks assessed/performed             Past Medical History:  Diagnosis Date   Abnormal Pap smear    Anemia    iron deficient   Anxiety    Breast disorder    precencerous lesion, marker placed   Complication of anesthesia    Depression    Family history of adverse reaction to anesthesia    Hiatal hernia    Hypothyroidism    Liver dysfunction    Menorrhagia    Migraine headache with aura    PONV (postoperative nausea and vomiting)     Past Surgical History:  Procedure Laterality Date   ABDOMINOPLASTY  5093   APPLICATION OF CRANIAL NAVIGATION N/A 10/21/2020   Procedure: APPLICATION OF CRANIAL NAVIGATION;  Surgeon: Vallarie Mare, MD;  Location: [facility];  Service: Neurosurgery;  Laterality: N/A;   AUGMENTATION MAMMAPLASTY Bilateral 2009   BILIARY STENT PLACEMENT N/A 12/10/2017   Procedure: BILIARY STENT PLACEMENT;  Surgeon: Carol Ada, MD;  Location: [facility];  Service: Endoscopy;  Laterality: N/A;  plastic 8.5 x 5 stent placed   BREAST BIOPSY Left 2010   precancerous bx   CRANIOTOMY Left 10/21/2020   Procedure: LEFT FRONTAL CRANIOTOMY FOR RESECTION OF MENINGIOMA;  Surgeon: Vallarie Mare, MD;  Location: [facility];  Service: Neurosurgery;  Laterality: Left;   CRYOTHERAPY      cervix-3x   ENDOMETRIAL ABLATION  2010   ERCP N/A 12/10/2017   Procedure: ENDOSCOPIC RETROGRADE CHOLANGIOPANCREATOGRAPHY (ERCP);  Surgeon: Carol Ada, MD;  Location: [facility];  Service: Endoscopy;  Laterality: N/A;  Balloon sweep of duct   LAPAROSCOPIC CHOLECYSTECTOMY  2008   SPHINCTEROTOMY  12/10/2017   Procedure: SPHINCTEROTOMY;  Surgeon: Carol Ada, MD;  Location: [facility];  Service: Endoscopy;;   TUBAL LIGATION  1998    There were no vitals filed for this visit.   Subjective Assessment - 12/07/20 1810     Subjective  I am depressed.  It has been 6 weeks.    Pertinent History sphincter of oddi dysfunction, hiatal hernia, hypothyroidism, depression, anxiety    Limitations no driving    Patient Stated Goals "get back to what I was before if not better"    Currently in Pain? No/denies    Pain Score 0-No pain                          OT Treatments/Exercises (OP) - 12/07/20 0001       Visual/Perceptual Exercises   Other Exercises Patient reports that eye doctor recommended vision therapy.  Strongly encourage patient to pursue.      Neurological Re-education Exercises   Other Exercises 1 Worked on functional mobility  as related to leisure interest - tennis.  Initially with left eye occluded, than R eye, than without occlusion. Worked on dynamic eye movement, eye hand, eye-hand-foot coordiantion for hitting targets with raquet, tossing and catching ball, and tossing and catching ball while walking without walker!  Patient with significant improvement in eye head movement tolerance, and improved balance throughout.                      OT Short Term Goals - 12/07/20 1818       OT SHORT TERM GOAL #1   Title Pt will be independent with diplopia HEP    Time 4    Period Weeks    Status Achieved    Target Date 12/03/20      OT SHORT TERM GOAL #2   Title Pt will perform environmental scanning with 90% Accuracy and no reports of diplopia with  occlusion PRN.    Time 4    Period Weeks    Status Achieved      OT SHORT TERM GOAL #3   Title Pt will be independent with occlusion schedule and recommendations per OT PRN    Time 4    Period Weeks    Status Achieved      OT SHORT TERM GOAL #4   Title Pt will perform simple warm meal prep and/or light housekeeping with no reports of diplopia with occlusion PRN and with mod I.    Time 4    Period Weeks    Status Achieved               OT Long Term Goals - 12/07/20 1818       OT LONG TERM GOAL #1   Title Pt will be independent with updated HEPs    Time 8    Period Weeks    Status On-going      OT LONG TERM GOAL #2   Title Pt will perform environmental scanning with cognitive component with 95% accuracy or greater and with no reports of diplopia with occlusion only PRN.    Time 8    Period Weeks    Status On-going      OT LONG TERM GOAL #3   Title Pt will perform mod complex meal prep (i.e. following instructions on side of rice) with mod I and no reports of diplopia with occlusion PRN    Time 8    Period Weeks    Status On-going      OT LONG TERM GOAL #4   Title Pt will tolerate visual activity with no occlusion for 5 minutes or greater with no reports of disturbance/dizziness.    Time 8    Period Weeks    Status On-going                   Plan - 12/07/20 1816     Clinical Impression Statement Patient has met all short term goals and is challenging herself to alternate patching with functional mobility and seems to be building tolerance / accomodating.    OT Occupational Profile and History Problem Focused Assessment - Including review of records relating to presenting problem    Occupational performance deficits (Please refer to evaluation for details): ADL's;IADL's    Body Structure / Function / Physical Skills Vision;Mobility;GMC;Strength;IADL;ADL;Decreased knowledge of use of DME    Clinical Decision Making Limited treatment options, no task  modification necessary    Comorbidities Affecting Occupational Performance: None  Modification or Assistance to Complete Evaluation  No modification of tasks or assist necessary to complete eval    OT Frequency 2x / week    OT Duration 8 weeks    OT Treatment/Interventions Self-care/ADL training;DME and/or AE instruction;Visual/perceptual remediation/compensation;Patient/family education;Therapist, nutritional;Therapeutic exercise;Neuromuscular education;Therapeutic activities    Plan continue eye head movement, eye hand coord, eye hand foot coord, reading - able to read a page (she was going to try)    Consulted and Agree with Plan of Care Patient             Patient will benefit from skilled therapeutic intervention in order to improve the following deficits and impairments:   Body Structure / Function / Physical Skills: Vision, Mobility, GMC, Strength, IADL, ADL, Decreased knowledge of use of DME       Visit Diagnosis: Visuospatial deficit  Unsteadiness on feet  Attention and concentration deficit  Other symptoms and signs involving the nervous system    Problem List Patient Active Problem List   Diagnosis Date Noted   Brain mass 10/21/2020   S/P resection of meningioma 10/21/2020   Chronic headaches 10/14/2020   Frontal mass of brain 10/13/2020   Elevated LFTs    Dilated cbd, acquired    Abdominal pain 12/06/2017   Hypothyroidism 12/06/2017   Night sweats 03/11/2015   S/P endometrial ablation 03/12/2013   Premenstrual syndrome 03/12/2013   Anemia 08/14/2012   Pelvic pain in female 08/13/2012   Menorrhagia     Mariah Milling 12/07/2020, 6:19 PM  [facility] [address] [place], [place], [address] Phone: [phone]   Fax:  [phone]  Name: DEMIANA CRUMBLEY MRN: [id number] Date of Birth: [birth year]

## 2020-12-08 ENCOUNTER — Encounter: Payer: Self-pay | Admitting: Physical Therapy

## 2020-12-08 ENCOUNTER — Encounter: Payer: Self-pay | Admitting: Occupational Therapy

## 2020-12-08 ENCOUNTER — Ambulatory Visit: Payer: 59 | Admitting: Occupational Therapy

## 2020-12-08 ENCOUNTER — Ambulatory Visit: Payer: 59

## 2020-12-08 ENCOUNTER — Ambulatory Visit: Payer: 59 | Admitting: Physical Therapy

## 2020-12-08 DIAGNOSIS — R2681 Unsteadiness on feet: Secondary | ICD-10-CM

## 2020-12-08 DIAGNOSIS — R41840 Attention and concentration deficit: Secondary | ICD-10-CM

## 2020-12-08 DIAGNOSIS — R41842 Visuospatial deficit: Secondary | ICD-10-CM | POA: Diagnosis not present

## 2020-12-08 DIAGNOSIS — R29818 Other symptoms and signs involving the nervous system: Secondary | ICD-10-CM

## 2020-12-08 DIAGNOSIS — R2689 Other abnormalities of gait and mobility: Secondary | ICD-10-CM

## 2020-12-08 NOTE — Therapy (Signed)
[facility] [address] [place], [place], [address] Phone: [phone]   Fax:  [phone]  Occupational Therapy Treatment  Patient Details  Name: Lydia Jenkins MRN: [id number] Date of Birth: 07-18-1970 Referring Provider (OT): Duffy Rhody, MD   Encounter Date: 12/08/2020   OT End of Session - 12/08/20 1854     Visit Number 9    Number of Visits 17    Date for OT Re-Evaluation 12/31/20    Authorization Time Period VL:MN No Auth    OT Start Time 1700    OT Stop Time 2376    OT Time Calculation (min) 45 min    Activity Tolerance Patient tolerated treatment well    Behavior During Therapy [facility] for tasks assessed/performed             Past Medical History:  Diagnosis Date   Abnormal Pap smear    Anemia    iron deficient   Anxiety    Breast disorder    precencerous lesion, marker placed   Complication of anesthesia    Depression    Family history of adverse reaction to anesthesia    Hiatal hernia    Hypothyroidism    Liver dysfunction    Menorrhagia    Migraine headache with aura    PONV (postoperative nausea and vomiting)     Past Surgical History:  Procedure Laterality Date   ABDOMINOPLASTY  2831   APPLICATION OF CRANIAL NAVIGATION N/A 10/21/2020   Procedure: APPLICATION OF CRANIAL NAVIGATION;  Surgeon: Vallarie Mare, MD;  Location: [facility];  Service: Neurosurgery;  Laterality: N/A;   AUGMENTATION MAMMAPLASTY Bilateral 2009   BILIARY STENT PLACEMENT N/A 12/10/2017   Procedure: BILIARY STENT PLACEMENT;  Surgeon: Carol Ada, MD;  Location: [facility];  Service: Endoscopy;  Laterality: N/A;  plastic 8.5 x 5 stent placed   BREAST BIOPSY Left 2010   precancerous bx   CRANIOTOMY Left 10/21/2020   Procedure: LEFT FRONTAL CRANIOTOMY FOR RESECTION OF MENINGIOMA;  Surgeon: Vallarie Mare, MD;  Location: [facility];  Service: Neurosurgery;  Laterality: Left;   CRYOTHERAPY     cervix-3x    ENDOMETRIAL ABLATION  2010   ERCP N/A 12/10/2017   Procedure: ENDOSCOPIC RETROGRADE CHOLANGIOPANCREATOGRAPHY (ERCP);  Surgeon: Carol Ada, MD;  Location: [facility];  Service: Endoscopy;  Laterality: N/A;  Balloon sweep of duct   LAPAROSCOPIC CHOLECYSTECTOMY  2008   SPHINCTEROTOMY  12/10/2017   Procedure: SPHINCTEROTOMY;  Surgeon: Carol Ada, MD;  Location: [facility];  Service: Endoscopy;;   TUBAL LIGATION  1998    There were no vitals filed for this visit.   Subjective Assessment - 12/08/20 1713     Subjective  I have had a busy day. Patient worked out with a Clinical research associate, went to [employer], and came to therapy    Pertinent History sphincter of oddi dysfunction, hiatal hernia, hypothyroidism, depression, anxiety    Limitations no driving    Patient Stated Goals "get back to what I was before if not better"    Currently in Pain? No/denies    Pain Score 0-No pain                          OT Treatments/Exercises (OP) - 12/08/20 0001       Visual/Perceptual Exercises   Other Exercises Reassessment of eye motor control.  Patient now without eye patching - able to see clearly/ singularly at arm's length in superior fields, at  eye level and at cheek level.  Patient still experiencing diplopia in inferior fields.  Patient lacks smooth pursuits, especially left eye (extraneous eye movement) in inferior fields.  Patient reports challenges with depth perception. Navigating stairs, curbs, uneven sidewalks.  Patient reports difficulty with more graded eye movement tasks - pouring water into coffee maker, pouring creamer into coffee, etc.  Patient continues to challenge herself to work with right eye occluded to force left eye use.  50% session without eye patch today.                      OT Short Term Goals - 12/08/20 1855       OT SHORT TERM GOAL #1   Title Pt will be independent with diplopia HEP    Time 4    Period Weeks    Status Achieved    [employer] Date  12/03/20      OT SHORT TERM GOAL #2   Title Pt will perform environmental scanning with 90% Accuracy and no reports of diplopia with occlusion PRN.    Time 4    Period Weeks    Status Achieved      OT SHORT TERM GOAL #3   Title Pt will be independent with occlusion schedule and recommendations per OT PRN    Time 4    Period Weeks    Status Achieved      OT SHORT TERM GOAL #4   Title Pt will perform simple warm meal prep and/or light housekeeping with no reports of diplopia with occlusion PRN and with mod I.    Time 4    Period Weeks    Status Achieved               OT Long Term Goals - 12/08/20 1856       OT LONG TERM GOAL #1   Title Pt will be independent with updated HEPs    Time 8    Period Weeks    Status On-going      OT LONG TERM GOAL #2   Title Pt will perform environmental scanning with cognitive component with 95% accuracy or greater and with no reports of diplopia with occlusion only PRN.    Time 8    Period Weeks    Status On-going      OT LONG TERM GOAL #3   Title Pt will perform mod complex meal prep (i.e. following instructions on side of rice) with mod I and no reports of diplopia with occlusion PRN    Time 8    Period Weeks    Status On-going      OT LONG TERM GOAL #4   Title Pt will tolerate visual activity with no occlusion for 5 minutes or greater with no reports of disturbance/dizziness.    Time 8    Period Weeks    Status On-going                   Plan - 12/08/20 1854     Clinical Impression Statement Patient continues to challenge herself to patch her stronger right eye with functional mobility and seems to be building tolerance / accomodating.  Patient with improved tolerance for no patching.    OT Occupational Profile and History Problem Focused Assessment - Including review of records relating to presenting problem    Occupational performance deficits (Please refer to evaluation for details): ADL's;IADL's    Body  Structure / Function / Physical Skills  Vision;Mobility;GMC;Strength;IADL;ADL;Decreased knowledge of use of DME    Clinical Decision Making Limited treatment options, no task modification necessary    Comorbidities Affecting Occupational Performance: None    Modification or Assistance to Complete Evaluation  No modification of tasks or assist necessary to complete eval    OT Frequency 2x / week    OT Duration 8 weeks    OT Treatment/Interventions Self-care/ADL training;DME and/or AE instruction;Visual/perceptual remediation/compensation;Patient/family education;Therapist, nutritional;Therapeutic exercise;Neuromuscular education;Therapeutic activities    Plan continue eye head movement, eye hand coord, eye hand foot coord, reading - able to read a page (she was going to try)    Consulted and Agree with Plan of Care Patient             Patient will benefit from skilled therapeutic intervention in order to improve the following deficits and impairments:   Body Structure / Function / Physical Skills: Vision, Mobility, GMC, Strength, IADL, ADL, Decreased knowledge of use of DME       Visit Diagnosis: Visuospatial deficit  Unsteadiness on feet  Attention and concentration deficit  Other symptoms and signs involving the nervous system    Problem List Patient Active Problem List   Diagnosis Date Noted   Brain mass 10/21/2020   S/P resection of meningioma 10/21/2020   Chronic headaches 10/14/2020   Frontal mass of brain 10/13/2020   Elevated LFTs    Dilated cbd, acquired    Abdominal pain 12/06/2017   Hypothyroidism 12/06/2017   Night sweats 03/11/2015   S/P endometrial ablation 03/12/2013   Premenstrual syndrome 03/12/2013   Anemia 08/14/2012   Pelvic pain in female 08/13/2012   Menorrhagia     Mariah Milling, OTR/L 12/08/2020, 6:56 PM  [facility] [address] [place], [place], [address] Phone:  [phone]   Fax:  [phone]  Name: Lydia Jenkins MRN: [id number] Date of Birth: [birth year]

## 2020-12-08 NOTE — Therapy (Signed)
[facility] [address] [place], [place], [address] Phone: [phone]   Fax:  [phone]  Physical Therapy Treatment  Patient Details  Name: Lydia Jenkins MRN: [id number] Date of Birth: [birth year] Referring Provider (PT): Vallarie Mare, MD   Encounter Date: 12/08/2020   PT End of Session - 12/09/20 0706     Visit Number 9    Number of Visits 17    Date for PT Re-Evaluation 01/03/21    Authorization Type [employer] 2022    PT Start Time 1619    PT Stop Time 1700    PT Time Calculation (min) 41 min    Equipment Utilized During Treatment Gait belt    Activity Tolerance Patient tolerated treatment well    Behavior During Therapy WFL for tasks assessed/performed             Past Medical History:  Diagnosis Date   Abnormal Pap smear    Anemia    iron deficient   Anxiety    Breast disorder    precencerous lesion, marker placed   Complication of anesthesia    Depression    Family history of adverse reaction to anesthesia    Hiatal hernia    Hypothyroidism    Liver dysfunction    Menorrhagia    Migraine headache with aura    PONV (postoperative nausea and vomiting)     Past Surgical History:  Procedure Laterality Date   ABDOMINOPLASTY  6962   APPLICATION OF CRANIAL NAVIGATION N/A 10/21/2020   Procedure: APPLICATION OF CRANIAL NAVIGATION;  Surgeon: Vallarie Mare, MD;  Location: [facility];  Service: Neurosurgery;  Laterality: N/A;   AUGMENTATION MAMMAPLASTY Bilateral 2009   BILIARY STENT PLACEMENT N/A 12/10/2017   Procedure: BILIARY STENT PLACEMENT;  Surgeon: Carol Ada, MD;  Location: [facility];  Service: Endoscopy;  Laterality: N/A;  plastic 8.5 x 5 stent placed   BREAST BIOPSY Left 2010   precancerous bx   CRANIOTOMY Left 10/21/2020   Procedure: LEFT FRONTAL CRANIOTOMY FOR RESECTION OF MENINGIOMA;  Surgeon: Vallarie Mare, MD;  Location: [facility];  Service: Neurosurgery;  Laterality: Left;    CRYOTHERAPY     cervix-3x   ENDOMETRIAL ABLATION  2010   ERCP N/A 12/10/2017   Procedure: ENDOSCOPIC RETROGRADE CHOLANGIOPANCREATOGRAPHY (ERCP);  Surgeon: Carol Ada, MD;  Location: [facility];  Service: Endoscopy;  Laterality: N/A;  Balloon sweep of duct   LAPAROSCOPIC CHOLECYSTECTOMY  2008   SPHINCTEROTOMY  12/10/2017   Procedure: SPHINCTEROTOMY;  Surgeon: Carol Ada, MD;  Location: [facility];  Service: Endoscopy;;   TUBAL LIGATION  1998    There were no vitals filed for this visit.   Subjective Assessment - 12/08/20 1621     Subjective Was absolutely exhausted from therapy yesterday. Had a busy day today and went to [employer]. Has been doing good with having the patch on her R eye.    Pertinent History hypothyroidism, hiatal hernia, spincter of Oddi dysfunciton, depression, and anxiety.    Limitations Walking    Patient Stated Goals wants to be back normal (if not better) - played tennis 3-5x a week and was doing crossfit, likes hiking    Currently in Pain? Yes    Pain Score 4     Pain Location Head    Pain Orientation Anterior;Left    Pain Descriptors / Indicators Tingling;Headache    Pain Type Surgical pain;Neuropathic pain  Access Code: [email or web address] URL: [email or web address] Date: 12/08/2020 Prepared by: Janann August  Upgraded pt's HEP bolded below:   Exercises Seated Gaze Stabilization with Head Nod - 1 x daily - 5 x weekly - 2 sets - 30 hold Romberg Stance Eyes Closed on Foam Pad - 1 x daily - 5 x weekly - 3 sets - 30 hold - on 2 pillows Standing Balance with Eyes Closed on Foam - 1 x daily - 5 x weekly - 2 sets - 10 reps - on 2 pillows with feet hip width x10 reps head nods, x10 reps head turns  Walking Tandem Stance - 1 x daily - 5 x weekly - 3 sets - forwards and backwards at countertop   Both below slow head motions at countertop to pt's tolerance:  Forward Walking with Head Rotations - 2 x daily -  5 x weekly - 3 sets Walking with Head Nod - 2 x daily - 5 x weekly - 3 sets     [facility] Adult PT Treatment/Exercise - 12/09/20 0708       Ambulation/Gait   Ambulation/Gait Yes    Ambulation/Gait Assistance 5: Supervision    Ambulation/Gait Assistance Details between activities with no AD and R eye occluded. no issues    Assistive device None    Gait Pattern Step-through pattern    Ambulation Surface Level;Indoor                 Balance Exercises - 12/09/20 0001       Balance Exercises: Standing   Standing Eyes Opened Narrow base of support (BOS)    Standing Eyes Opened Limitations on 2 pillows x10 reps head turns, x10 reps head nods    Marching Foam/compliant surface;Static;10 reps    Marching Limitations static marching on 2 pillows x10 reps, then adding reciprocal UE lift x10 reps    Other Standing Exercises on blue foam beam: cross over steps forwards down and back x2 reps               PT Education - 12/09/20 0706     Education Details updates to HEP, discussed pt can ambulate into therapy without her RW if she is having a good day fatigue wise, but to continue using RW for community ambulation/longer distances    Person(s) Educated Patient    Methods Explanation;Demonstration;Handout    Comprehension Verbalized understanding;Returned demonstration              PT Short Term Goals - 12/07/20 1637       PT SHORT TERM GOAL #1   Title Pt will be independent with initial HEP in order to build upon functional gains made in therapy. ALL STGS DUE 12/03/20.    Baseline met on 12/07/20    Time 4    Period Weeks    Status Achieved    [facility] Date 12/03/20      PT SHORT TERM GOAL #2   Title Pt will improve condition 4 of mCTSIB to at least 15 seconds in order to demo improved vestibular input for balance.    Baseline 6.3 seconds on 11/09/20; 30 seconds on 12/07/20    Time 4    Period Weeks    Status Achieved      PT SHORT TERM GOAL #3   Title Pt will undergo  further testing of BERG with STG/LTG written as appropriate.    Baseline 54/56 on 11/09/20, pt wearing glasses for double vision, would benefit from future assessment without glasses  Time 4    Period Weeks    Status Achieved      PT SHORT TERM GOAL #4   Title Pt will ambulate at least 69' with LRAD with supervision over indoor level surfaces in order to demo improved household mobility.    Baseline met with no AD.    Time 4    Period Weeks    Status Achieved      PT SHORT TERM GOAL #5   Title Pt will improve gait speed to at least 2.2 ft/sec with RW vs. LRAD in order to demo improved community mobility.    Baseline previously 1.84 ft/sec with RW, 9.19 = 3.56 ft/sec with no AD and R eye covered.    Time 4    Period Weeks    Status Achieved               PT Long Term Goals - 12/07/20 2045       PT LONG TERM GOAL #1   Title BERG goal/higher level balance test goal to be written as appropriate. ALL LTGS DUE 12/31/20    Time 8    Period Weeks    Status New      PT LONG TERM GOAL #2   Title Pt will improve gait speed to at least 3.9 ft/sec with no AD and no eye patch to demo improved community mobility.    Baseline 1.84 ft/sec with RW; 9.19 = 3.56 ft/sec R eye covered.    Time 8    Period Weeks    Status Revised      PT LONG TERM GOAL #3   Title Pt will ambulate at least 500' outdoors over paved/unlevel surfaces with supervision and LRAD/no AD in order to demo improved community mobility.    Time 8    Period Weeks    Status New      PT LONG TERM GOAL #4   Title Vestibular/mCTSIB goal to be written as appropriate.    Baseline not yet assessed.    Time 8    Period Weeks    Status New      PT LONG TERM GOAL #5   Title Pt will perform 16 steps using single handrail and step through pattern with supervision in order to be able to go up to the 2nd floor of her house.    Baseline unable to go upstairs    Time 8    Period Weeks    Status New                    Plan - 12/09/20 0711     Clinical Impression Statement Today's skilled session focused on upgrading pt's HEP to include incr vestibular input for balance with eyes closed on compliant surfaces, narrow BOS, and gait with head motions to pt's tolerance. Pt tolerated session well. continued to use patch over R eye with no AD used throughout session with no issues. Will continue to progress towards LTGs.    Personal Factors and Comorbidities Comorbidity 3+;Past/Current Experience    Comorbidities hypothyroidism, hiatal hernia, spincter of Oddi dysfunciton, depression, and anxiety.    Examination-Activity Limitations Stairs;Transfers;Squat;Locomotion Level    Examination-Participation Restrictions Community Activity;Driving;Cleaning;Laundry   playing tennis   Stability/Clinical Decision Making Evolving/Moderate complexity    Rehab Potential Good    PT Frequency 2x / week    PT Duration 8 weeks    PT Treatment/Interventions ADLs/Self Care Home Management;Stair training;Gait training;DME Instruction;Functional mobility training;Therapeutic activities;Therapeutic exercise;Neuromuscular re-education;Balance training;Patient/family  education;Vestibular;Visual/perceptual remediation/compensation    PT Next Visit Plan perform stairs. continue with coordinated eye and head movements and standing balance on unlevel surfaces/vision removed,  gaze stabilization (VOR x 1 and x 2 when able), visual motion exercises/tracking.  R eye occluded vs. no eyes occluded.    PT Home Exercise Plan [email or web address]    Consulted and Agree with Plan of Care Patient             Patient will benefit from skilled therapeutic intervention in order to improve the following deficits and impairments:  Abnormal gait, Decreased activity tolerance, Decreased balance, Dizziness, Decreased strength, Impaired vision/preception, Decreased endurance  Visit Diagnosis: Unsteadiness on feet  Other symptoms and signs involving the  nervous system  Other abnormalities of gait and mobility     Problem List Patient Active Problem List   Diagnosis Date Noted   Brain mass 10/21/2020   S/P resection of meningioma 10/21/2020   Chronic headaches 10/14/2020   Frontal mass of brain 10/13/2020   Elevated LFTs    Dilated cbd, acquired    Abdominal pain 12/06/2017   Hypothyroidism 12/06/2017   Night sweats 03/11/2015   S/P endometrial ablation 03/12/2013   Premenstrual syndrome 03/12/2013   Anemia 08/14/2012   Pelvic pain in female 08/13/2012   Menorrhagia     Arliss Journey, PT, DPT  12/09/2020, 7:13 AM  [facility] [address] [place], [place], [address] Phone: [phone]   Fax:  [phone]  Name: ALLAINA BROTZMAN MRN: [id number] Date of Birth: [birth year]

## 2020-12-09 ENCOUNTER — Ambulatory Visit: Payer: 59 | Admitting: Physical Therapy

## 2020-12-09 ENCOUNTER — Encounter: Payer: 59 | Admitting: Occupational Therapy

## 2020-12-09 NOTE — Patient Instructions (Signed)
Access Code: [email or web address] URL: [email or web address] Date: 12/08/2020 Prepared by: Janann August  Exercises Seated Gaze Stabilization with Head Nod - 1 x daily - 5 x weekly - 2 sets - 30 hold Romberg Stance Eyes Closed on Foam Pad - 1 x daily - 5 x weekly - 3 sets - 30 hold Standing Balance with Eyes Closed on Foam - 1 x daily - 5 x weekly - 2 sets - 10 reps Walking Tandem Stance - 1 x daily - 5 x weekly - 3 sets Forward Walking with Head Rotations - 2 x daily - 5 x weekly - 3 sets Walking with Head Nod - 2 x daily - 5 x weekly - 3 sets

## 2020-12-13 ENCOUNTER — Ambulatory Visit: Payer: 59 | Admitting: Physical Therapy

## 2020-12-13 ENCOUNTER — Ambulatory Visit: Payer: 59

## 2020-12-13 ENCOUNTER — Encounter: Payer: 59 | Admitting: Occupational Therapy

## 2020-12-13 DIAGNOSIS — R41841 Cognitive communication deficit: Secondary | ICD-10-CM

## 2020-12-13 DIAGNOSIS — R41842 Visuospatial deficit: Secondary | ICD-10-CM | POA: Diagnosis not present

## 2020-12-13 NOTE — Patient Instructions (Signed)
Memory Compensation Strategies  Use "WARM" strategy. W= write it down A=  associate it R=  repeat it M=  make a mental picture  You can keep a Memory Notebook. Use a 3-ring notebook with sections for the following:  calendar, important names and phone numbers, medications, doctors' names/phone numbers, "to do list"/reminders, and a section to journal what you did each day  Use a calendar to write appointments down.  Write yourself a schedule for the day.  This can be placed on the calendar or in a separate section of the Memory Notebook.  Keeping a regular schedule can help memory.  Use medication organizer with sections for each day or morning/evening pills  You may need help loading it  Keep a basket, or pegboard by the door.   Place items that you need to take out with you in the basket or on the pegboard.  You may also want to include a message board for reminders.  Use sticky notes. Place sticky notes with reminders in a place where the task is performed.  For example:  "turn off the stove" placed by the stove, "lock the door" placed on the door at eye level, "take your medications" on the bathroom mirror or by the place where you normally take your medications  Use alarms/timers.  Use while cooking to remind yourself to check on food or as a reminder to take your medicine, or as a reminder to make a call, or as a reminder to perform another task, etc.  Use a voice recorder app or small tape recorder to record important information and notes for yourself. Go back at the end of the day and listen to these.  Strategies for Improving Your Attention and Memory  Use good eye-contact Give the speaker your undivided attention Look directly at the speaker  Complete one task at a time Avoid multitasking Complete one task before starting a new one Write a note to yourself if you think of something else that needs to be done Let others know when you need quiet time and can't be  interrupted Don't answer the phone, texts, or emails while you are working on another task  Put aside distracting thoughts If you find your mind wandering, refocus your attention on the speaker Avoid off-topic comments or responses that may divert your attention  If something important comes to mind, let the speaker know and pause to write yourself a note: "Do you mind holding on a minute, I have to write something down." Put thoughts on hold and focus on salient information  Limit distractions in your environment Think about the environment around you Limit background noise by turning off the TV or music, putting your phone away Close the door and work in quiet  Use active listening Actively participate in the conversation to stay focused Paraphrase what you have heard to include the most important details Adding some associations may help you remember Ask questions to clarify certain points Summarize the speaker's comments periodically Avoid nodding your head and using "mhm" responses as these are more passive and don't help your attention  Alert the other person/people It may be helpful to alert your listener to the fact that you may need reminders to keep on track Tell the speaker in advance that you may need to stop them and have them repeat salient information If you lose focus, interject and let the person know, "I'm sorry, I lost you, can you tell me again?"  Write down information Write  down pertinent information as it comes up, such as telephone numbers, names of people, addresses, details from appointments and conversations, etc.

## 2020-12-13 NOTE — Therapy (Signed)
[facility] [address] [place], [place], [address] Phone: [phone]   Fax:  [phone]  Speech Language Pathology Evaluation  Patient Details  Name: RAYNIE STEINHAUS MRN: [id number] Date of Birth: 1970-06-07 Referring Provider (SLP): Vallarie Mare, MD   Encounter Date: 12/13/2020   End of Session - 12/13/20 1220     Visit Number 1    Number of Visits 17    Date for SLP Re-Evaluation 02/07/21    SLP Start Time 0623    SLP Stop Time  1230    SLP Time Calculation (min) 45 min    Activity Tolerance Patient tolerated treatment well             Past Medical History:  Diagnosis Date   Abnormal Pap smear    Anemia    iron deficient   Anxiety    Breast disorder    precencerous lesion, marker placed   Complication of anesthesia    Depression    Family history of adverse reaction to anesthesia    Hiatal hernia    Hypothyroidism    Liver dysfunction    Menorrhagia    Migraine headache with aura    PONV (postoperative nausea and vomiting)     Past Surgical History:  Procedure Laterality Date   ABDOMINOPLASTY  7628   APPLICATION OF CRANIAL NAVIGATION N/A 10/21/2020   Procedure: APPLICATION OF CRANIAL NAVIGATION;  Surgeon: Vallarie Mare, MD;  Location: [facility];  Service: Neurosurgery;  Laterality: N/A;   AUGMENTATION MAMMAPLASTY Bilateral 2009   BILIARY STENT PLACEMENT N/A 12/10/2017   Procedure: BILIARY STENT PLACEMENT;  Surgeon: Carol Ada, MD;  Location: [facility];  Service: Endoscopy;  Laterality: N/A;  plastic 8.5 x 5 stent placed   BREAST BIOPSY Left 2010   precancerous bx   CRANIOTOMY Left 10/21/2020   Procedure: LEFT FRONTAL CRANIOTOMY FOR RESECTION OF MENINGIOMA;  Surgeon: Vallarie Mare, MD;  Location: [facility];  Service: Neurosurgery;  Laterality: Left;   CRYOTHERAPY     cervix-3x   ENDOMETRIAL ABLATION  2010   ERCP N/A 12/10/2017   Procedure: ENDOSCOPIC RETROGRADE  CHOLANGIOPANCREATOGRAPHY (ERCP);  Surgeon: Carol Ada, MD;  Location: [facility];  Service: Endoscopy;  Laterality: N/A;  Balloon sweep of duct   LAPAROSCOPIC CHOLECYSTECTOMY  2008   SPHINCTEROTOMY  12/10/2017   Procedure: SPHINCTEROTOMY;  Surgeon: Carol Ada, MD;  Location: [facility];  Service: Endoscopy;;   TUBAL LIGATION  1998    There were no vitals filed for this visit.       SLP Evaluation [facility] - 12/13/20 1143       SLP Visit Information   SLP Received On 11/25/20    Referring Provider (SLP) Vallarie Mare, MD    Onset Date 10-21-20    Medical Diagnosis Meningioma resection   Persons encountering health services in other specified circumstances     Subjective   Patient/Family Stated Goal return to baseline      Pain Assessment   Currently in Pain? 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     Cognition   Overall  Cognitive Status Impaired/Different from baseline    Area of Impairment Orientation;Attention;Memory;Awareness;Problem solving    Orientation Level Time    General Comments Pt expresses difficulty with orientation to DOW    Current Attention Level Sustained    Attention Comments Pt reports difficulty focusing    Memory Decreased short-term memory    Memory Comments Pt indicates she must write everything down otherwise she won't be able to remember    Awareness Emergent;Anticipatory    Awareness Comments inconsistent error awareness    Problem Solving Slow processing;Requires verbal cues      Auditory Comprehension   Overall Auditory Comprehension Appears within functional limits for tasks assessed      Expression   Primary Mode of Expression  Verbal      Verbal Expression   Overall Verbal Expression Appears within functional limits for tasks assessed      Written Expression   Dominant Hand Right      Oral Motor/Sensory Function   Overall Oral Motor/Sensory Function Appears within functional limits for tasks assessed      Motor Speech   Overall Motor Speech Appears within functional limits for tasks assessed      Standardized Assessments   Standardized Assessments  Cognitive Linguistic Quick Test      Cognitive Linguistic Quick Test (Ages 18-69)   Attention WNL    Memory Mild    Executive Function WNL    Language WNL    Visuospatial Skills WNL    Severity Rating Total 19    Composite Severity Rating 15.8                             SLP Education - 12/13/20 1157     Education Details eval results, possible goals, memory compensations    Person(s) Educated Patient    Methods Explanation;Demonstration;Handout    Comprehension Verbalized understanding;Returned demonstration;Need further instruction              SLP Short Term Goals - 12/13/20 2123       SLP SHORT TERM GOAL #1   Title Pt will use memory compensations for appointments, medicine management, and other daily activities with occasional min A over 2 sessions    Time 4    Period Weeks    Status New      SLP SHORT TERM GOAL #2   Title Pt will demonstrate awareness of errors on mod complex structured tasks with 80% accuracy given rare min A over 2 sessions    Time 4    Period Weeks    Status New      SLP SHORT TERM GOAL #3   Title Pt will recall 3 items on daily to-do list with use of memory strategy with rare min A over 2 sessions    Time 4    Period Weeks    Status New      SLP SHORT TERM GOAL #4   Title Pt will independently orient self to day of week with use of memory strategy for 2/3 opportunities given rare min A    Time 4    Period Weeks    Status New      SLP SHORT TERM GOAL #5   Title Pt will complete  cognitive communication PROM in first 1-2 ST sessions    Time 2    Period Weeks    Status New  SLP Long Term Goals - 12/13/20 2134       SLP LONG TERM GOAL #1   Title Pt will use memory compensations for appointments, medicine management, and other daily activities with rare min A over 2 sessions    Time 8    Period Weeks    Status New      SLP LONG TERM GOAL #2   Title Pt will demonstrate awareness of errors on mod complex structured tasks with 90% accuracy given rare min A over 2 sessions    Time 8    Period Weeks    Status New      SLP LONG TERM GOAL #3   Title Pt will recall 5 items on daily to-do list with use of memory strategies with rare min A over 2 sessions    Time 8    Period Weeks    Status New      SLP LONG TERM GOAL #4   Title Pt will report improved cognitive functioning by 2 points by last ST session    Time [place] - 12/13/20 2115     Clinical Impression Statement Kendahl presents for OPST evaluation secondary to meningiomia ressection in May 2022. Pt reports changes in memory, attention, and orientation impacting daily functioning. Pt expressed difficulty with recall of DOW, recall and attention during medication management, and increased processing time. Occasional word finding also indicated. Pt is currently relying on writing down all information and requires increased effort to reduce potential errors. CLQT completed this session, which revealed mild memory deficits. Reduced error awareness, decreased selective attention, and delayed processing speed exhibited on clock drawing, story retell, and design generation subtests. Pt desires to return to baseline. Skilled ST is warranted to address mild cognitive communication changes to maximize functional independence and optimize functioning.    Speech Therapy Frequency 2x / week    Duration 8 weeks    Treatment/Interventions Compensatory  strategies;Cueing hierarchy;Functional tasks;Patient/family education;Environmental controls;Cognitive reorganization;Compensatory techniques;Multimodal communcation approach;Language facilitation;Internal/external aids;SLP instruction and feedback    Potential to Achieve Goals Good    Consulted and Agree with Plan of Care Patient             Patient will benefit from skilled therapeutic intervention in order to improve the following deficits and impairments:   Cognitive communication deficit    Problem List Patient Active Problem List   Diagnosis Date Noted   Brain mass 10/21/2020   S/P resection of meningioma 10/21/2020   Chronic headaches 10/14/2020   Frontal mass of brain 10/13/2020   Elevated LFTs    Dilated cbd, acquired    Abdominal pain 12/06/2017   Hypothyroidism 12/06/2017   Night sweats 03/11/2015   S/P endometrial ablation 03/12/2013   Premenstrual syndrome 03/12/2013   Anemia 08/14/2012   Pelvic pain in female 08/13/2012   Menorrhagia     Alinda Deem, MA CCC-SLP 12/13/2020, 9:48 PM  [facility] [address] [place], [place], [address] Phone: [phone]   Fax:  [phone]  Name: LAINA GUERRIERI MRN: [id number] Date of Birth: [birth year]

## 2020-12-14 ENCOUNTER — Encounter: Payer: Self-pay | Admitting: Occupational Therapy

## 2020-12-14 ENCOUNTER — Ambulatory Visit: Payer: 59 | Admitting: Occupational Therapy

## 2020-12-14 ENCOUNTER — Other Ambulatory Visit: Payer: Self-pay

## 2020-12-14 ENCOUNTER — Ambulatory Visit: Payer: 59

## 2020-12-14 ENCOUNTER — Encounter: Payer: Self-pay | Admitting: Physical Therapy

## 2020-12-14 ENCOUNTER — Ambulatory Visit: Payer: 59 | Admitting: Physical Therapy

## 2020-12-14 DIAGNOSIS — R29818 Other symptoms and signs involving the nervous system: Secondary | ICD-10-CM

## 2020-12-14 DIAGNOSIS — R41841 Cognitive communication deficit: Secondary | ICD-10-CM

## 2020-12-14 DIAGNOSIS — R2689 Other abnormalities of gait and mobility: Secondary | ICD-10-CM

## 2020-12-14 DIAGNOSIS — R2681 Unsteadiness on feet: Secondary | ICD-10-CM

## 2020-12-14 DIAGNOSIS — R41840 Attention and concentration deficit: Secondary | ICD-10-CM

## 2020-12-14 DIAGNOSIS — R41842 Visuospatial deficit: Secondary | ICD-10-CM

## 2020-12-14 NOTE — Therapy (Signed)
[facility] [address] [place], [place], [address] Phone: [phone]   Fax:  [phone]  Occupational Therapy Treatment  Patient Details  Name: Lydia Jenkins MRN: [id number] Date of Birth: 06-26-1970 Referring Provider (OT): Duffy Rhody, MD   Encounter Date: 12/14/2020   OT End of Session - 12/14/20 1022     Visit Number 10    Number of Visits 17    Date for OT Re-Evaluation 12/31/20    Authorization Time Period VL:MN No Auth    OT Start Time 1017    OT Stop Time 1100    OT Time Calculation (min) 43 min    Activity Tolerance Patient tolerated treatment well    Behavior During Therapy WFL for tasks assessed/performed             Past Medical History:  Diagnosis Date   Abnormal Pap smear    Anemia    iron deficient   Anxiety    Breast disorder    precencerous lesion, marker placed   Complication of anesthesia    Depression    Family history of adverse reaction to anesthesia    Hiatal hernia    Hypothyroidism    Liver dysfunction    Menorrhagia    Migraine headache with aura    PONV (postoperative nausea and vomiting)     Past Surgical History:  Procedure Laterality Date   ABDOMINOPLASTY  0254   APPLICATION OF CRANIAL NAVIGATION N/A 10/21/2020   Procedure: APPLICATION OF CRANIAL NAVIGATION;  Surgeon: Vallarie Mare, MD;  Location: [facility];  Service: Neurosurgery;  Laterality: N/A;   AUGMENTATION MAMMAPLASTY Bilateral 2009   BILIARY STENT PLACEMENT N/A 12/10/2017   Procedure: BILIARY STENT PLACEMENT;  Surgeon: Carol Ada, MD;  Location: [facility];  Service: Endoscopy;  Laterality: N/A;  plastic 8.5 x 5 stent placed   BREAST BIOPSY Left 2010   precancerous bx   CRANIOTOMY Left 10/21/2020   Procedure: LEFT FRONTAL CRANIOTOMY FOR RESECTION OF MENINGIOMA;  Surgeon: Vallarie Mare, MD;  Location: [facility];  Service: Neurosurgery;  Laterality: Left;   CRYOTHERAPY     cervix-3x    ENDOMETRIAL ABLATION  2010   ERCP N/A 12/10/2017   Procedure: ENDOSCOPIC RETROGRADE CHOLANGIOPANCREATOGRAPHY (ERCP);  Surgeon: Carol Ada, MD;  Location: [facility];  Service: Endoscopy;  Laterality: N/A;  Balloon sweep of duct   LAPAROSCOPIC CHOLECYSTECTOMY  2008   SPHINCTEROTOMY  12/10/2017   Procedure: SPHINCTEROTOMY;  Surgeon: Carol Ada, MD;  Location: [facility];  Service: Endoscopy;;   TUBAL LIGATION  1998    There were no vitals filed for this visit.   Subjective Assessment - 12/14/20 1019     Subjective  "I'm trying to schedule my pick up" Pt reports tripping up steps recently.    Pertinent History sphincter of oddi dysfunction, hiatal hernia, hypothyroidism, depression, anxiety    Limitations no driving    Patient Stated Goals "get back to what I was before if not better"    Currently in Pain? No/denies                          OT Treatments/Exercises (OP) - 12/14/20 1026       ADLs   Cooking pt reports doing a little bit of cooking but not full meals.      Visual/Perceptual Exercises   Scanning Tabletop    Scanning - Tabletop word search and writing word after finding for increased eye  movements and scanning at tabletop - pt started with right eye occluded for a few words and then moved to no occlusion. Pt unable to tolerate scanning, even on vertical surface, with no occlusion. Pt varied between glasses on without occlusion and glasses off without occlusion. Pt was able to see more clearly without the glasses but was able to tolerate word search without glasses and without occlusion with increased time    Other Exercises Pt reports having an appt with vision therapy on aug 24th or 25th. Continue to address and encourage vision therapy. Pt is rotating patch about 50% right eye and 50% left eye. Pt reports maybe going without patch about 10 minutes in morning and 10 minutes at night with reports of it getting clearer. Pt may need new Rx for glasses as  glasses seem to increase diplopia with tabletop scanning.                      OT Short Term Goals - 12/08/20 1855       OT SHORT TERM GOAL #1   Title Pt will be independent with diplopia HEP    Time 4    Period Weeks    Status Achieved    Target Date 12/03/20      OT SHORT TERM GOAL #2   Title Pt will perform environmental scanning with 90% Accuracy and no reports of diplopia with occlusion PRN.    Time 4    Period Weeks    Status Achieved      OT SHORT TERM GOAL #3   Title Pt will be independent with occlusion schedule and recommendations per OT PRN    Time 4    Period Weeks    Status Achieved      OT SHORT TERM GOAL #4   Title Pt will perform simple warm meal prep and/or light housekeeping with no reports of diplopia with occlusion PRN and with mod I.    Time 4    Period Weeks    Status Achieved               OT Long Term Goals - 12/08/20 1856       OT LONG TERM GOAL #1   Title Pt will be independent with updated HEPs    Time 8    Period Weeks    Status On-going      OT LONG TERM GOAL #2   Title Pt will perform environmental scanning with cognitive component with 95% accuracy or greater and with no reports of diplopia with occlusion only PRN.    Time 8    Period Weeks    Status On-going      OT LONG TERM GOAL #3   Title Pt will perform mod complex meal prep (i.e. following instructions on side of rice) with mod I and no reports of diplopia with occlusion PRN    Time 8    Period Weeks    Status On-going      OT LONG TERM GOAL #4   Title Pt will tolerate visual activity with no occlusion for 5 minutes or greater with no reports of disturbance/dizziness.    Time 8    Period Weeks    Status On-going                   Plan - 12/14/20 1405     Clinical Impression Statement Pt is doing well with alternating patch and building tolerance for no occlusion.  Pt continues to progress towards goals.    OT Occupational Profile and  History Problem Focused Assessment - Including review of records relating to presenting problem    Occupational performance deficits (Please refer to evaluation for details): ADL's;IADL's    Body Structure / Function / Physical Skills Vision;Mobility;GMC;Strength;IADL;ADL;Decreased knowledge of use of DME    Clinical Decision Making Limited treatment options, no task modification necessary    Comorbidities Affecting Occupational Performance: None    Modification or Assistance to Complete Evaluation  No modification of tasks or assist necessary to complete eval    OT Frequency 2x / week    OT Duration 8 weeks    OT Treatment/Interventions Self-care/ADL training;DME and/or AE instruction;Visual/perceptual remediation/compensation;Patient/family education;Therapist, nutritional;Therapeutic exercise;Neuromuscular education;Therapeutic activities    Plan continue eye head movement, eye hand coord, eye hand foot coord, reading - able to read a page (she was going to try)    Consulted and Agree with Plan of Care Patient             Patient will benefit from skilled therapeutic intervention in order to improve the following deficits and impairments:   Body Structure / Function / Physical Skills: Vision, Mobility, GMC, Strength, IADL, ADL, Decreased knowledge of use of DME       Visit Diagnosis: Unsteadiness on feet  Other symptoms and signs involving the nervous system  Other abnormalities of gait and mobility  Visuospatial deficit  Attention and concentration deficit    Problem List Patient Active Problem List   Diagnosis Date Noted   Brain mass 10/21/2020   S/P resection of meningioma 10/21/2020   Chronic headaches 10/14/2020   Frontal mass of brain 10/13/2020   Elevated LFTs    Dilated cbd, acquired    Abdominal pain 12/06/2017   Hypothyroidism 12/06/2017   Night sweats 03/11/2015   S/P endometrial ablation 03/12/2013   Premenstrual syndrome 03/12/2013   Anemia  08/14/2012   Pelvic pain in female 08/13/2012   Menorrhagia     Zachery Conch MOT, OTR/L  12/14/2020, 2:06 PM  [facility] [address] [place], [place], [address] Phone: [phone]   Fax:  [phone]  Name: SARANN TREGRE MRN: [id number] Date of Birth: 12/21/1970

## 2020-12-14 NOTE — Therapy (Signed)
[facility] [address] [place], [place], [address] Phone: [phone]   Fax:  [phone]  Physical Therapy Treatment  Patient Details  Name: Lydia Jenkins MRN: [id number] Date of Birth: [birth year] Referring Provider (PT): Vallarie Mare, MD   Encounter Date: 12/14/2020   PT End of Session - 12/14/20 1018     Visit Number 10    Number of Visits 17    Date for PT Re-Evaluation 01/03/21    Authorization Type [employer] [id number]    PT Start Time 0931    PT Stop Time 1015    PT Time Calculation (min) 44 min    Equipment Utilized During Treatment Gait belt    Activity Tolerance Patient tolerated treatment well    Behavior During Therapy WFL for tasks assessed/performed             Past Medical History:  Diagnosis Date   Abnormal Pap smear    Anemia    iron deficient   Anxiety    Breast disorder    precencerous lesion, marker placed   Complication of anesthesia    Depression    Family history of adverse reaction to anesthesia    Hiatal hernia    Hypothyroidism    Liver dysfunction    Menorrhagia    Migraine headache with aura    PONV (postoperative nausea and vomiting)     Past Surgical History:  Procedure Laterality Date   ABDOMINOPLASTY  3559   APPLICATION OF CRANIAL NAVIGATION N/A 10/21/2020   Procedure: APPLICATION OF CRANIAL NAVIGATION;  Surgeon: Vallarie Mare, MD;  Location: [facility];  Service: Neurosurgery;  Laterality: N/A;   AUGMENTATION MAMMAPLASTY Bilateral 2009   BILIARY STENT PLACEMENT N/A 12/10/2017   Procedure: BILIARY STENT PLACEMENT;  Surgeon: Carol Ada, MD;  Location: [facility];  Service: Endoscopy;  Laterality: N/A;  plastic 8.5 x 5 stent placed   BREAST BIOPSY Left 2010   precancerous bx   CRANIOTOMY Left 10/21/2020   Procedure: LEFT FRONTAL CRANIOTOMY FOR RESECTION OF MENINGIOMA;  Surgeon: Vallarie Mare, MD;  Location: [facility];  Service: Neurosurgery;  Laterality: Left;    CRYOTHERAPY     cervix-3x   ENDOMETRIAL ABLATION  2010   ERCP N/A 12/10/2017   Procedure: ENDOSCOPIC RETROGRADE CHOLANGIOPANCREATOGRAPHY (ERCP);  Surgeon: Carol Ada, MD;  Location: [facility];  Service: Endoscopy;  Laterality: N/A;  Balloon sweep of duct   LAPAROSCOPIC CHOLECYSTECTOMY  2008   SPHINCTEROTOMY  12/10/2017   Procedure: SPHINCTEROTOMY;  Surgeon: Carol Ada, MD;  Location: [facility];  Service: Endoscopy;;   TUBAL LIGATION  1998    There were no vitals filed for this visit.   Subjective Assessment - 12/14/20 0934     Subjective Will be going to [place] at the end of the month - was cleared to fly by her doctor. Stairs have been difficult due to depth perception. Not using her patch right away in the mornings until she goes to the kitchen - feels like it is better and not as blurry. Has appt with vision with therapy on august 24th.    Pertinent History hypothyroidism, hiatal hernia, spincter of Oddi dysfunciton, depression, and anxiety.    Limitations Walking    Patient Stated Goals wants to be back normal (if not better) - played tennis 3-5x a week and was doing crossfit, likes hiking    Currently in Pain? No/denies  [facility] Adult PT Treatment/Exercise - 12/14/20 0001       Ambulation/Gait   Ambulation/Gait Yes    Ambulation/Gait Assistance 5: Supervision    Ambulation/Gait Assistance Details between activities no AD and R eye covered. pt ambulating into clinic today without AD.    Assistive device None    Gait Pattern Step-through pattern    Ambulation Surface Level;Indoor    Stairs Yes    Stairs Assistance 5: Supervision    Stairs Assistance Details (indicate cue type and reason) performed stairs as pt does at home - with single handrail and step to pattern, cued for descending with weaker LLE first. also performed stairs with reciprocal pattern with no handrail ascending and single handrail while descending,     Stair Management Technique One rail Right;Step to pattern;Alternating pattern;Forwards;No rails    Number of Stairs 4   x6 reps   Height of Stairs 6      Therapeutic Activites    Therapeutic Activities Other Therapeutic Activities    Other Therapeutic Activities with pt going to [place] at beginning of august discussed contacting pt's airline/airport for airport shuttle transportation in between flights (esp at larger airport), making feet wider on pillows at home for balance exercises (pt reports feeling more off steady with eyes closed balance exercises at home), taping stairs with bright tape due to visual deficits for incr safety when performing (the tape on in-clinic stairs was very helpful)                 Balance Exercises - 12/14/20 1012       Balance Exercises: Standing   Standing Eyes Closed Narrow base of support (BOS);Foam/compliant surface    Standing Eyes Closed Limitations up incline with feet together x10 reps head turns, then on blue mat feet together 2 x 30 seconds, on blue mat descending on incline x30 seconds, incr difficulty when standing up incline    Rockerboard Anterior/posterior;Lateral;Head turns;EO    Rockerboard Limitations in M/L direction: weight shifting with no UE support x15 reps, head turns 2 x 10 reps, head nods 2 x 10 reps - pt with incr weight shift to R. holding board steady: performing ball toss in different directions with PT tech x10 reps, then performed with board in A/P direction: beginning with larger ball and progressing to catching/visual tracking with tennis ball and adding in cognitive challenge naming foods in alphabetical order A-Z    Step Ups Forward;4 inch;Intermittent UE support;Limitations    Step Ups Limitations with air ex on 4" step, alternating step ups and floating non stance leg, cues to look down for foot placement at step x7 reps B, then adding in a head turn to either R/L when performing step up x5 reps B, incr difficulty with  LLE               PT Education - 12/14/20 1018     Education Details see TA.    Person(s) Educated Patient    Methods Explanation    Comprehension Verbalized understanding              PT Short Term Goals - 12/07/20 1637       PT SHORT TERM GOAL #1   Title Pt will be independent with initial HEP in order to build upon functional gains made in therapy. ALL STGS DUE 12/03/20.    Baseline met on 12/07/20    Time 4    Period Weeks    Status Achieved    Target  Date 12/03/20      PT SHORT TERM GOAL #2   Title Pt will improve condition 4 of mCTSIB to at least 15 seconds in order to demo improved vestibular input for balance.    Baseline 6.3 seconds on 11/09/20; 30 seconds on 12/07/20    Time 4    Period Weeks    Status Achieved      PT SHORT TERM GOAL #3   Title Pt will undergo further testing of BERG with STG/LTG written as appropriate.    Baseline 54/56 on 11/09/20, pt wearing glasses for double vision, would benefit from future assessment without glasses    Time 4    Period Weeks    Status Achieved      PT SHORT TERM GOAL #4   Title Pt will ambulate at least 61' with LRAD with supervision over indoor level surfaces in order to demo improved household mobility.    Baseline met with no AD.    Time 4    Period Weeks    Status Achieved      PT SHORT TERM GOAL #5   Title Pt will improve gait speed to at least 2.2 ft/sec with RW vs. LRAD in order to demo improved community mobility.    Baseline previously 1.84 ft/sec with RW, 9.19 = 3.56 ft/sec with no AD and R eye covered.    Time 4    Period Weeks    Status Achieved               PT Long Term Goals - 12/07/20 2045       PT LONG TERM GOAL #1   Title BERG goal/higher level balance test goal to be written as appropriate. ALL LTGS DUE 12/31/20    Time 8    Period Weeks    Status New      PT LONG TERM GOAL #2   Title Pt will improve gait speed to at least 3.9 ft/sec with no AD and no eye patch to demo  improved community mobility.    Baseline 1.84 ft/sec with RW; 9.19 = 3.56 ft/sec R eye covered.    Time 8    Period Weeks    Status Revised      PT LONG TERM GOAL #3   Title Pt will ambulate at least 500' outdoors over paved/unlevel surfaces with supervision and LRAD/no AD in order to demo improved community mobility.    Time 8    Period Weeks    Status New      PT LONG TERM GOAL #4   Title Vestibular/mCTSIB goal to be written as appropriate.    Baseline not yet assessed.    Time 8    Period Weeks    Status New      PT LONG TERM GOAL #5   Title Pt will perform 16 steps using single handrail and step through pattern with supervision in order to be able to go up to the 2nd floor of her house.    Baseline unable to go upstairs    Time 8    Period Weeks    Status New                   Plan - 12/14/20 1122     Clinical Impression Statement Continued to work in session with patch occluding R eye. Performed stairs today with step to pattern and single handrail with pt doing well with use of tape on clinic stairs due to visual  deficits, discussed pt using tape on her stairs at home. Remainder of session focused on balance strategies on compliant surfaces with vision removed, head motions, SLS, and dynamic/cognitive tasks. Pt tolerated session well, will continue to progress towards LTGs.    Personal Factors and Comorbidities Comorbidity 3+;Past/Current Experience    Comorbidities hypothyroidism, hiatal hernia, spincter of Oddi dysfunciton, depression, and anxiety.    Examination-Activity Limitations Stairs;Transfers;Squat;Locomotion Level    Examination-Participation Restrictions Community Activity;Driving;Cleaning;Laundry   playing tennis   Stability/Clinical Decision Making Evolving/Moderate complexity    Rehab Potential Good    PT Frequency 2x / week    PT Duration 8 weeks    PT Treatment/Interventions ADLs/Self Care Home Management;Stair training;Gait training;DME  Instruction;Functional mobility training;Therapeutic activities;Therapeutic exercise;Neuromuscular re-education;Balance training;Patient/family education;Vestibular;Visual/perceptual remediation/compensation    PT Next Visit Plan continue with coordinated eye and head movements and standing balance on unlevel surfaces/vision removed and head motions (and SLS tasks). dynamic gait with no AD. progress gaze stabilzation (VOR x1 with R eye occluded).  R eye occluded vs. no eyes occluded.    PT Home Exercise Plan [id number]    Consulted and Agree with Plan of Care Patient             Patient will benefit from skilled therapeutic intervention in order to improve the following deficits and impairments:  Abnormal gait, Decreased activity tolerance, Decreased balance, Dizziness, Decreased strength, Impaired vision/preception, Decreased endurance  Visit Diagnosis: Unsteadiness on feet  Other symptoms and signs involving the nervous system  Other abnormalities of gait and mobility     Problem List Patient Active Problem List   Diagnosis Date Noted   Brain mass 10/21/2020   S/P resection of meningioma 10/21/2020   Chronic headaches 10/14/2020   Frontal mass of brain 10/13/2020   Elevated LFTs    Dilated cbd, acquired    Abdominal pain 12/06/2017   Hypothyroidism 12/06/2017   Night sweats 03/11/2015   S/P endometrial ablation 03/12/2013   Premenstrual syndrome 03/12/2013   Anemia 08/14/2012   Pelvic pain in female 08/13/2012   Menorrhagia     Arliss Journey, PT, DPT  12/14/2020, 11:25 AM  [facility] [address] [place], [place], [address] Phone: [phone]   Fax:  [phone]  Name: Lydia Jenkins MRN: [id number] Date of Birth: 10/29/1970

## 2020-12-14 NOTE — Patient Instructions (Signed)
Memory Compensation Strategies   Use "WARM" strategy. W= write it down A=  associate it R=  repeat it M=  make a mental picture   You can keep a Memory Notebook. Use a 3-ring notebook with sections for the following:  calendar, important names and phone numbers, medications, doctors' names/phone numbers, "to do list"/reminders, and a section to journal what you did each day    Use a calendar to write appointments down.   Write yourself a schedule for the day. This can be placed on the calendar or in a separate section of the Memory Notebook.  Keeping a regular schedule can help memory.   Use medication organizer with sections for each day or morning/evening pills You may need help loading it    Keep a basket, or pegboard by the door.   Place items that you need to take out with you in the basket or on the pegboard.  You may also want to include a message board for reminders.    Use sticky notes. Place sticky notes with reminders in a place where the task is performed.  For example:  "turn off the stove" placed by the stove, "lock the door" placed on the door at eye level, "take your medications" on the bathroom mirror or by the place where you normally take your medications    Use alarms/timers.     Use while cooking to remind yourself to check on food or as a reminder to take your medicine, or as a reminder to make a call, or as a reminder to perform another task, etc.    Use a voice recorder app or small tape recorder to record important information and notes for yourself. Go back at the end of the day and listen to these.   Strategies for Improving Your Attention and Memory   Use good eye-contact Give the speaker your undivided attention Look directly at the speaker   Complete one task at a time Avoid multitasking Complete one task before starting a new one Write a note to yourself if you think of something else that needs to be done Let others know when you need quiet  time and can't be interrupted Don't answer the phone, texts, or emails while you are working on another task   Put aside distracting thoughts If you find your mind wandering, refocus your attention on the speaker Avoid off-topic comments or responses that may divert your attention If something important comes to mind, let the speaker know and pause to write yourself a note: "Do you mind holding on a minute, I have to write something down." Put thoughts on hold and focus on salient information   Limit distractions in your environment Think about the environment around you Limit background noise by turning off the TV or music, putting your phone away Close the door and work in quiet   Use active listening Actively participate in the conversation to stay focused Paraphrase what you have heard to include the most important details Adding some associations may help you remember Ask questions to clarify certain points Summarize the speaker's comments periodically Avoid nodding your head and using "mhm" responses as these are more passive and don't help your attention   Alert the other person/people It may be helpful to alert your listener to the fact that you may need reminders to keep on track Tell the speaker in advance that you may need to stop them and have them repeat salient information If you lose focus,  interject and let the person know, "I'm sorry, I lost you, can you tell me again?"   Write down information Write down pertinent information as it comes up, such as telephone numbers, names of people, addresses, details from appointments and conversations, etc.

## 2020-12-14 NOTE — Therapy (Signed)
[facility] [address] [place], [place], [address] Phone: [phone]   Fax:  [phone]  Speech Language Pathology Treatment  Patient Details  Name: Lydia Jenkins MRN: [id number] Date of Birth: [birth year] Referring Provider (SLP): Vallarie Mare, MD   Encounter Date: 12/14/2020   End of Session - 12/14/20 1421     Visit Number 2    Number of Visits 17    Date for SLP Re-Evaluation 02/07/21    SLP Start Time 1100    SLP Stop Time  3536    SLP Time Calculation (min) 45 min    Activity Tolerance Patient tolerated treatment well             Past Medical History:  Diagnosis Date   Abnormal Pap smear    Anemia    iron deficient   Anxiety    Breast disorder    precencerous lesion, marker placed   Complication of anesthesia    Depression    Family history of adverse reaction to anesthesia    Hiatal hernia    Hypothyroidism    Liver dysfunction    Menorrhagia    Migraine headache with aura    PONV (postoperative nausea and vomiting)     Past Surgical History:  Procedure Laterality Date   ABDOMINOPLASTY  1443   APPLICATION OF CRANIAL NAVIGATION N/A 10/21/2020   Procedure: APPLICATION OF CRANIAL NAVIGATION;  Surgeon: Vallarie Mare, MD;  Location: [facility];  Service: Neurosurgery;  Laterality: N/A;   AUGMENTATION MAMMAPLASTY Bilateral 2009   BILIARY STENT PLACEMENT N/A 12/10/2017   Procedure: BILIARY STENT PLACEMENT;  Surgeon: Carol Ada, MD;  Location: [facility];  Service: Endoscopy;  Laterality: N/A;  plastic 8.5 x 5 stent placed   BREAST BIOPSY Left 2010   precancerous bx   CRANIOTOMY Left 10/21/2020   Procedure: LEFT FRONTAL CRANIOTOMY FOR RESECTION OF MENINGIOMA;  Surgeon: Vallarie Mare, MD;  Location: [facility];  Service: Neurosurgery;  Laterality: Left;   CRYOTHERAPY     cervix-3x   ENDOMETRIAL ABLATION  2010   ERCP N/A 12/10/2017   Procedure: ENDOSCOPIC RETROGRADE  CHOLANGIOPANCREATOGRAPHY (ERCP);  Surgeon: Carol Ada, MD;  Location: [facility];  Service: Endoscopy;  Laterality: N/A;  Balloon sweep of duct   LAPAROSCOPIC CHOLECYSTECTOMY  2008   SPHINCTEROTOMY  12/10/2017   Procedure: SPHINCTEROTOMY;  Surgeon: Carol Ada, MD;  Location: [facility];  Service: Endoscopy;;   TUBAL LIGATION  1998    There were no vitals filed for this visit.   Subjective Assessment - 12/14/20 1102     Subjective "I don't have the double vision without glasses"    Currently in Pain? No/denies                   ADULT SLP TREATMENT - 12/14/20 1100       General Information   Behavior/Cognition Alert;Pleasant mood;Cooperative      Treatment Provided   Treatment provided Cognitive-Linquistic      Cognitive-Linquistic Treatment   Treatment focused on Cognition;Patient/family/caregiver education    Skilled Treatment Pt reported findings of OT session with seemingly good recall and attention to detail demonstrated. SLP analyzed weekly to-do lists utilized by patient, with good recall system initiated. SLP provided education re: memory compensations, including use of mental picture, repetition, and associations. SLP demonstrated functional application for daily life to aid recall of pertinent information at home as well as recall 3 items on daily to-do list. Pt able to recall  3 items from today's to-do list after 10+ minutes. SLP educated and modeled how to use reminder section on phone to aid recall of morning medication.      Assessment / Recommendations / Plan   Plan Continue with current plan of care      Progression Toward Goals   Progression toward goals Progressing toward goals              SLP Education - 12/14/20 1421     Education Details memory and attention compensations, functional application    Person(s) Educated Patient    Methods Explanation;Demonstration;Handout    Comprehension Verbalized understanding;Returned  demonstration;Need further instruction              SLP Short Term Goals - 12/14/20 1422       SLP SHORT TERM GOAL #1   Title Pt will use memory compensations for appointments, medicine management, and other daily activities with occasional min A over 2 sessions    Time 4    Period Weeks    Status On-going      SLP SHORT TERM GOAL #2   Title Pt will demonstrate awareness of errors on mod complex structured tasks with 80% accuracy given rare min A over 2 sessions    Time 4    Period Weeks    Status On-going      SLP SHORT TERM GOAL #3   Title Pt will recall 3 items on daily to-do list with use of memory strategy with rare min A over 2 sessions    Time 4    Period Weeks    Status On-going      SLP SHORT TERM GOAL #4   Title Pt will independently orient self to day of week with use of memory strategy for 2/3 opportunities given rare min A    Time 4    Period Weeks    Status On-going      SLP SHORT TERM GOAL #5   Title Pt will complete cognitive communication PROM in first 1-2 ST sessions    Time 2    Period Weeks    Status On-going              SLP Long Term Goals - 12/14/20 1422       SLP LONG TERM GOAL #1   Title Pt will use memory compensations for appointments, medicine management, and other daily activities with rare min A over 2 sessions    Time 8    Period Weeks    Status On-going      SLP LONG TERM GOAL #2   Title Pt will demonstrate awareness of errors on mod complex structured tasks with 90% accuracy given rare min A over 2 sessions    Time 8    Period Weeks    Status On-going      SLP LONG TERM GOAL #3   Title Pt will recall 5 items on daily to-do list with use of memory strategies with rare min A over 2 sessions    Time 8    Period Weeks    Status On-going      SLP LONG TERM GOAL #4   Title Pt will report improved cognitive functioning by 2 points by last ST session    Time 8    Period Weeks    Status On-going              Plan  - 12/14/20 1423     Clinical Impression Statement  Lydia Jenkins presents for OPST intervention secondary to meningiomia resection in May 2022. SLP educated and demonstrated use of memory and attention compensations to aid functioning for daily life. SLP trained use of external aids for medication management and use of compensations to recall items from daily to-do lists. Skilled ST is warranted to address mild cognitive communication changes to maximize functional independence and optimize functioning.    Speech Therapy Frequency 2x / week    Duration 8 weeks    Treatment/Interventions Compensatory strategies;Cueing hierarchy;Functional tasks;Patient/family education;Environmental controls;Cognitive reorganization;Compensatory techniques;Multimodal communcation approach;Language facilitation;Internal/external aids;SLP instruction and feedback    Potential to Achieve Goals Good    SLP Home Exercise Plan provided    Consulted and Agree with Plan of Care Patient             Patient will benefit from skilled therapeutic intervention in order to improve the following deficits and impairments:   Cognitive communication deficit    Problem List Patient Active Problem List   Diagnosis Date Noted   Brain mass 10/21/2020   S/P resection of meningioma 10/21/2020   Chronic headaches 10/14/2020   Frontal mass of brain 10/13/2020   Elevated LFTs    Dilated cbd, acquired    Abdominal pain 12/06/2017   Hypothyroidism 12/06/2017   Night sweats 03/11/2015   S/P endometrial ablation 03/12/2013   Premenstrual syndrome 03/12/2013   Anemia 08/14/2012   Pelvic pain in female 08/13/2012   Menorrhagia     Alinda Deem, MA CCC-SLP 12/14/2020, 2:24 PM  [facility] [address] [place], [place], [address] Phone: [phone]   Fax:  [phone]   Name: Lydia Jenkins MRN: [id number] Date of Birth: [birth year]

## 2020-12-17 ENCOUNTER — Ambulatory Visit: Payer: 59

## 2020-12-17 ENCOUNTER — Other Ambulatory Visit: Payer: Self-pay

## 2020-12-17 ENCOUNTER — Encounter: Payer: 59 | Admitting: Occupational Therapy

## 2020-12-17 ENCOUNTER — Encounter: Payer: Self-pay | Admitting: Occupational Therapy

## 2020-12-17 ENCOUNTER — Ambulatory Visit: Payer: 59 | Admitting: Physical Therapy

## 2020-12-17 ENCOUNTER — Ambulatory Visit: Payer: 59 | Admitting: Occupational Therapy

## 2020-12-17 DIAGNOSIS — R2681 Unsteadiness on feet: Secondary | ICD-10-CM

## 2020-12-17 DIAGNOSIS — R41840 Attention and concentration deficit: Secondary | ICD-10-CM

## 2020-12-17 DIAGNOSIS — R29818 Other symptoms and signs involving the nervous system: Secondary | ICD-10-CM

## 2020-12-17 DIAGNOSIS — R42 Dizziness and giddiness: Secondary | ICD-10-CM

## 2020-12-17 DIAGNOSIS — R41842 Visuospatial deficit: Secondary | ICD-10-CM | POA: Diagnosis not present

## 2020-12-17 DIAGNOSIS — R2689 Other abnormalities of gait and mobility: Secondary | ICD-10-CM

## 2020-12-17 NOTE — Therapy (Signed)
[facility] [address] [place], [place], [address] Phone: [phone]   Fax:  [phone]  Physical Therapy Treatment  Patient Details  Name: Lydia Jenkins MRN: [id number] Date of Birth: May 07, 1971 Referring Provider (PT): Vallarie Mare, MD   Encounter Date: 12/17/2020   PT End of Session - 12/17/20 0857     Visit Number 11    Number of Visits 17    Date for PT Re-Evaluation 01/03/21    Authorization Type [employer] 2022    PT Start Time 0848    PT Stop Time 0930    PT Time Calculation (min) 42 min    Equipment Utilized During Treatment Gait belt    Activity Tolerance Patient tolerated treatment well    Behavior During Therapy WFL for tasks assessed/performed             Past Medical History:  Diagnosis Date   Abnormal Pap smear    Anemia    iron deficient   Anxiety    Breast disorder    precencerous lesion, marker placed   Complication of anesthesia    Depression    Family history of adverse reaction to anesthesia    Hiatal hernia    Hypothyroidism    Liver dysfunction    Menorrhagia    Migraine headache with aura    PONV (postoperative nausea and vomiting)     Past Surgical History:  Procedure Laterality Date   ABDOMINOPLASTY  7416   APPLICATION OF CRANIAL NAVIGATION N/A 10/21/2020   Procedure: APPLICATION OF CRANIAL NAVIGATION;  Surgeon: Vallarie Mare, MD;  Location: [facility];  Service: Neurosurgery;  Laterality: N/A;   AUGMENTATION MAMMAPLASTY Bilateral 2009   BILIARY STENT PLACEMENT N/A 12/10/2017   Procedure: BILIARY STENT PLACEMENT;  Surgeon: Carol Ada, MD;  Location: [facility];  Service: Endoscopy;  Laterality: N/A;  plastic 8.5 x 5 stent placed   BREAST BIOPSY Left 2010   precancerous bx   CRANIOTOMY Left 10/21/2020   Procedure: LEFT FRONTAL CRANIOTOMY FOR RESECTION OF MENINGIOMA;  Surgeon: Vallarie Mare, MD;  Location: [facility];  Service: Neurosurgery;  Laterality: Left;    CRYOTHERAPY     cervix-3x   ENDOMETRIAL ABLATION  2010   ERCP N/A 12/10/2017   Procedure: ENDOSCOPIC RETROGRADE CHOLANGIOPANCREATOGRAPHY (ERCP);  Surgeon: Carol Ada, MD;  Location: [facility];  Service: Endoscopy;  Laterality: N/A;  Balloon sweep of duct   LAPAROSCOPIC CHOLECYSTECTOMY  2008   SPHINCTEROTOMY  12/10/2017   Procedure: SPHINCTEROTOMY;  Surgeon: Carol Ada, MD;  Location: [facility];  Service: Endoscopy;;   TUBAL LIGATION  1998    There were no vitals filed for this visit.   Subjective Assessment - 12/17/20 0848     Subjective Pt states she did put tape on her steps so she can see it better. Reports no falls at home.    Pertinent History hypothyroidism, hiatal hernia, spincter of Oddi dysfunciton, depression, and anxiety.    Limitations Walking    Patient Stated Goals wants to be back normal (if not better) - played tennis 3-5x a week and was doing crossfit, likes hiking    Currently in Pain? No/denies                                Vestibular Treatment/Exercise - 12/17/20 0001       X1 Viewing Horizontal   Foot Position Standing on foam feet together  Comments 2x30 sec R eye occluded 2x30 sec      X1 Viewing Vertical   Foot Position standing on foam feet together   reports seeing double   Comments 2x30 sec R eye occluded 2x30 sec                Balance Exercises - 12/17/20 0001       Balance Exercises: Standing   Standing Eyes Opened Narrow base of support (BOS);Foam/compliant surface;30 secs;Wide (BOA)   x30 sec wide and then narrow   Standing Eyes Closed Narrow base of support (BOS);Foam/compliant surface;30 secs;2 reps;Head turns   head nods and then head turns   SLS with Vectors Foam/compliant surface   x10 forward, x10 side on to cone; x10 forward + side on to cone   Step Ups Forward;Intermittent UE support;Limitations;6 inch    Step Ups Limitations with air ex on 6" step, alternating step ups and floating non stance  leg, cues to look down for foot placement at step x10 reps B, then adding in a head turn to either R/L when performing step up x5 reps B, incr difficulty with LLE    Tandem Gait Forward;Retro;Intermittent upper extremity support;Foam/compliant surface   x3 trips next to counter; x3 trips with head turns   Marching Foam/compliant surface;Static;Head turns   2x30 sec head turns and then head nods   Other Standing Exercises on blue mat on incline 2x30 sec with head turns EO and then [facility]                 PT Short Term Goals - 12/07/20 1637       PT SHORT TERM GOAL #1   Title Pt will be independent with initial HEP in order to build upon functional gains made in therapy. ALL STGS DUE 12/03/20.    Baseline met on 12/07/20    Time 4    Period Weeks    Status Achieved    Target Date 12/03/20      PT SHORT TERM GOAL #2   Title Pt will improve condition 4 of mCTSIB to at least 15 seconds in order to demo improved vestibular input for balance.    Baseline 6.3 seconds on 11/09/20; 30 seconds on 12/07/20    Time 4    Period Weeks    Status Achieved      PT SHORT TERM GOAL #3   Title Pt will undergo further testing of BERG with STG/LTG written as appropriate.    Baseline 54/56 on 11/09/20, pt wearing glasses for double vision, would benefit from future assessment without glasses    Time 4    Period Weeks    Status Achieved      PT SHORT TERM GOAL #4   Title Pt will ambulate at least 60' with LRAD with supervision over indoor level surfaces in order to demo improved household mobility.    Baseline met with no AD.    Time 4    Period Weeks    Status Achieved      PT SHORT TERM GOAL #5   Title Pt will improve gait speed to at least 2.2 ft/sec with RW vs. LRAD in order to demo improved community mobility.    Baseline previously 1.84 ft/sec with RW, 9.19 = 3.56 ft/sec with no AD and R eye covered.    Time 4    Period Weeks    Status Achieved  PT Long Term Goals -  12/07/20 2045       PT LONG TERM GOAL #1   Title BERG goal/higher level balance test goal to be written as appropriate. ALL LTGS DUE 12/31/20    Time 8    Period Weeks    Status New      PT LONG TERM GOAL #2   Title Pt will improve gait speed to at least 3.9 ft/sec with no AD and no eye patch to demo improved community mobility.    Baseline 1.84 ft/sec with RW; 9.19 = 3.56 ft/sec R eye covered.    Time 8    Period Weeks    Status Revised      PT LONG TERM GOAL #3   Title Pt will ambulate at least 500' outdoors over paved/unlevel surfaces with supervision and LRAD/no AD in order to demo improved community mobility.    Time 8    Period Weeks    Status New      PT LONG TERM GOAL #4   Title Vestibular/mCTSIB goal to be written as appropriate.    Baseline not yet assessed.    Time 8    Period Weeks    Status New      PT LONG TERM GOAL #5   Title Pt will perform 16 steps using single handrail and step through pattern with supervision in order to be able to go up to the 2nd floor of her house.    Baseline unable to go upstairs    Time 8    Period Weeks    Status New                   Plan - 12/17/20 5462     Clinical Impression Statement R eye occluded throughout treatment. Continued to work on [employer]. Pt with blurred vision performing VOR exercises.    Personal Factors and Comorbidities Comorbidity 3+;Past/Current Experience    Comorbidities hypothyroidism, hiatal hernia, spincter of Oddi dysfunciton, depression, and anxiety.    Examination-Activity Limitations Stairs;Transfers;Squat;Locomotion Level    Examination-Participation Restrictions Community Activity;Driving;Cleaning;Laundry   playing tennis   Stability/Clinical Decision Making Evolving/Moderate complexity    Rehab Potential Good    PT Frequency 2x / week    PT Duration 8 weeks    PT Treatment/Interventions ADLs/Self Care Home Management;Stair training;Gait training;DME  Instruction;Functional mobility training;Therapeutic activities;Therapeutic exercise;Neuromuscular re-education;Balance training;Patient/family education;Vestibular;Visual/perceptual remediation/compensation    PT Next Visit Plan continue with coordinated eye and head movements and standing balance on unlevel surfaces/vision removed and head motions (and SLS tasks). dynamic gait with no AD. progress gaze stabilzation (VOR x1 with R eye occluded).  R eye occluded vs. no eyes occluded.    PT Home Exercise Plan [id number]    Consulted and Agree with Plan of Care Patient             Patient will benefit from skilled therapeutic intervention in order to improve the following deficits and impairments:  Abnormal gait, Decreased activity tolerance, Decreased balance, Dizziness, Decreased strength, Impaired vision/preception, Decreased endurance  Visit Diagnosis: Unsteadiness on feet  Other symptoms and signs involving the nervous system  Other abnormalities of gait and mobility  Dizziness and giddiness     Problem List Patient Active Problem List   Diagnosis Date Noted   Brain mass 10/21/2020   S/P resection of meningioma 10/21/2020   Chronic headaches 10/14/2020   Frontal mass of brain 10/13/2020   Elevated LFTs    Dilated cbd, acquired  Abdominal pain 12/06/2017   Hypothyroidism 12/06/2017   Night sweats 03/11/2015   S/P endometrial ablation 03/12/2013   Premenstrual syndrome 03/12/2013   Anemia 08/14/2012   Pelvic pain in female 08/13/2012   Menorrhagia     Cambri Plourde April Ma L [place] PT, DPT 12/17/2020, 9:34 AM  [facility] [address] [place], [place], [address] Phone: [phone]   Fax:  [phone]  Name: Lydia Jenkins MRN: [id number] Date of Birth: Feb 05, 1971

## 2020-12-17 NOTE — Therapy (Signed)
[facility] [address] [place], [place], [address] Phone: [phone]   Fax:  [phone]  Occupational Therapy Treatment  Patient Details  Name: Lydia Jenkins MRN: [id number] Date of Birth: [birth year] Referring Provider (OT): Duffy Rhody, MD   Encounter Date: 12/17/2020   OT End of Session - 12/17/20 0806     Visit Number 11    Number of Visits 17    Date for OT Re-Evaluation 12/31/20    Authorization Time Period VL:MN No Auth    OT Start Time 0803    OT Stop Time 0845    OT Time Calculation (min) 42 min    Activity Tolerance Patient tolerated treatment well    Behavior During Therapy [facility] for tasks assessed/performed             Past Medical History:  Diagnosis Date   Abnormal Pap smear    Anemia    iron deficient   Anxiety    Breast disorder    precencerous lesion, marker placed   Complication of anesthesia    Depression    Family history of adverse reaction to anesthesia    Hiatal hernia    Hypothyroidism    Liver dysfunction    Menorrhagia    Migraine headache with aura    PONV (postoperative nausea and vomiting)     Past Surgical History:  Procedure Laterality Date   ABDOMINOPLASTY  [id number]   APPLICATION OF CRANIAL NAVIGATION N/A 10/21/2020   Procedure: APPLICATION OF CRANIAL NAVIGATION;  Surgeon: Vallarie Mare, MD;  Location: [facility];  Service: Neurosurgery;  Laterality: N/A;   AUGMENTATION MAMMAPLASTY Bilateral 2009   BILIARY STENT PLACEMENT N/A 12/10/2017   Procedure: BILIARY STENT PLACEMENT;  Surgeon: Carol Ada, MD;  Location: [facility];  Service: Endoscopy;  Laterality: N/A;  plastic 8.5 x 5 stent placed   BREAST BIOPSY Left 2010   precancerous bx   CRANIOTOMY Left 10/21/2020   Procedure: LEFT FRONTAL CRANIOTOMY FOR RESECTION OF MENINGIOMA;  Surgeon: Vallarie Mare, MD;  Location: [facility];  Service: Neurosurgery;  Laterality: Left;   CRYOTHERAPY     cervix-3x    ENDOMETRIAL ABLATION  2010   ERCP N/A 12/10/2017   Procedure: ENDOSCOPIC RETROGRADE CHOLANGIOPANCREATOGRAPHY (ERCP);  Surgeon: Carol Ada, MD;  Location: [facility];  Service: Endoscopy;  Laterality: N/A;  Balloon sweep of duct   LAPAROSCOPIC CHOLECYSTECTOMY  2008   SPHINCTEROTOMY  12/10/2017   Procedure: SPHINCTEROTOMY;  Surgeon: Carol Ada, MD;  Location: [facility];  Service: Endoscopy;;   TUBAL LIGATION  1998    There were no vitals filed for this visit.   Subjective Assessment - 12/17/20 0804     Subjective  "i'm doing great today" "i got my recordds from my surgery and will not complain about being patient and my progress" "trying a little more without the patch on and not getting as woozie"    Pertinent History sphincter of oddi dysfunction, hiatal hernia, hypothyroidism, depression, anxiety    Limitations no driving    Patient Stated Goals "get back to what I was before if not better"    Currently in Pain? No/denies                          OT Treatments/Exercises (OP) - 12/17/20 [id number]       Visual/Perceptual Exercises   Other Exercises Resistance Clothespins without occlusion with looking down with slight head or eye movements up for placing  clothespins on antenna. Pt with report of good/single vision inferior and with gazing up for placing clothespins. As long as looking straight at activity, patient had single vision with completing resistance clothespin activity. Disruption occurred with any side movements with eyes/head.  Pt reports not being able to do head turns or movements with eyes open cause it never resets but if she closes eyes and reopens at target she can get it to align when she opens her eyes. Therapist explained eye movements vs head movements. Lateral movements with eyes not head causes the most disruption currently.   Eye movements while following yellow/gold ball with chest press x 13 reps - no reports of diplopia. Pt reports if she  looks slightly past ball has diplopia (approx 86f distance). Moved ball superior and inferior x 13 reps with increased disturbance (blurriness over diplopia per pt report) occurring slightly above eye level. none occuring in inferior range this day. Did not go below navel. X 13 reps with horizontal abduction with Diplopia present at approx 45* to right and pt reports eye movements to left and back to center produces increased disturabance and blurriness. Pt reports some blurriness (no diplopia) to left at 45* arc but reports not as significant as to the right. Pt does not report increased disturbance with moving to right back to center from left.   Medium Pegs/Copying pattern on vertical surface approx 5.5-6 ft away. Pt with RIGHT eye occluded during activity.   Reading pt reports needing single line with font size in book but needing to make font bigger in kindle and does fine reading that and her notes in her notebook.   Pt continues to report that she sees more clearly with her glasses OFF. This is a new prescription but received a couple of months prior to surgery. Visual acuity may need to be assessed again. Will defer to vision therapist for determining need at this time or waiting.                      OT Short Term Goals - 12/08/20 1855       OT SHORT TERM GOAL #1   Title Pt will be independent with diplopia HEP    Time 4    Period Weeks    Status Achieved    Target Date 12/03/20      OT SHORT TERM GOAL #2   Title Pt will perform environmental scanning with 90% Accuracy and no reports of diplopia with occlusion PRN.    Time 4    Period Weeks    Status Achieved      OT SHORT TERM GOAL #3   Title Pt will be independent with occlusion schedule and recommendations per OT PRN    Time 4    Period Weeks    Status Achieved      OT SHORT TERM GOAL #4   Title Pt will perform simple warm meal prep and/or light housekeeping with no reports of diplopia with occlusion PRN and  with mod I.    Time 4    Period Weeks    Status Achieved               OT Long Term Goals - 12/08/20 1856       OT LONG TERM GOAL #1   Title Pt will be independent with updated HEPs    Time 8    Period Weeks    Status On-going      OT LONG  TERM GOAL #2   Title Pt will perform environmental scanning with cognitive component with 95% accuracy or greater and with no reports of diplopia with occlusion only PRN.    Time 8    Period Weeks    Status On-going      OT LONG TERM GOAL #3   Title Pt will perform mod complex meal prep (i.e. following instructions on side of rice) with mod I and no reports of diplopia with occlusion PRN    Time 8    Period Weeks    Status On-going      OT LONG TERM GOAL #4   Title Pt will tolerate visual activity with no occlusion for 5 minutes or greater with no reports of disturbance/dizziness.    Time 8    Period Weeks    Status On-going                   Plan - 12/17/20 [id number]     Clinical Impression Statement Pt is tolerating more time and activities without occlusion and continues to tolerate alternating patch more throughout the day. Pt continues with significant disturbance and diplopia with side to side eye movements or head turns. Single vision arc/area is getting greater.    OT Occupational Profile and History Problem Focused Assessment - Including review of records relating to presenting problem    Occupational performance deficits (Please refer to evaluation for details): ADL's;IADL's    Body Structure / Function / Physical Skills Vision;Mobility;GMC;Strength;IADL;ADL;Decreased knowledge of use of DME    Clinical Decision Making Limited treatment options, no task modification necessary    Comorbidities Affecting Occupational Performance: None    Modification or Assistance to Complete Evaluation  No modification of tasks or assist necessary to complete eval    OT Frequency 2x / week    OT Duration 8 weeks    OT  Treatment/Interventions Self-care/ADL training;DME and/or AE instruction;Visual/perceptual remediation/compensation;Patient/family education;Therapist, nutritional;Therapeutic exercise;Neuromuscular education;Therapeutic activities    Plan continue eye head movement, eye hand coord, eye hand foot coord, reading - able to read a page (she was going to try)    Consulted and Agree with Plan of Care Patient             Patient will benefit from skilled therapeutic intervention in order to improve the following deficits and impairments:   Body Structure / Function / Physical Skills: Vision, Mobility, GMC, Strength, IADL, ADL, Decreased knowledge of use of DME       Visit Diagnosis: Unsteadiness on feet  Attention and concentration deficit  Visuospatial deficit  Other symptoms and signs involving the nervous system    Problem List Patient Active Problem List   Diagnosis Date Noted   Brain mass 10/21/2020   S/P resection of meningioma 10/21/2020   Chronic headaches 10/14/2020   Frontal mass of brain 10/13/2020   Elevated LFTs    Dilated cbd, acquired    Abdominal pain 12/06/2017   Hypothyroidism 12/06/2017   Night sweats 03/11/2015   S/P endometrial ablation 03/12/2013   Premenstrual syndrome 03/12/2013   Anemia 08/14/2012   Pelvic pain in female 08/13/2012   Menorrhagia     Zachery Conch MOT, OTR/L  12/17/2020, 8:49 AM  [facility] [address] [place], [place], [address] Phone: [phone]   Fax:  [phone]  Name: Lydia Jenkins MRN: [id number] Date of Birth: 1970-07-03

## 2020-12-20 ENCOUNTER — Encounter: Payer: 59 | Admitting: Occupational Therapy

## 2020-12-20 ENCOUNTER — Ambulatory Visit: Payer: 59 | Admitting: Physical Therapy

## 2020-12-21 ENCOUNTER — Encounter: Payer: Self-pay | Admitting: Occupational Therapy

## 2020-12-21 ENCOUNTER — Ambulatory Visit: Payer: 59 | Admitting: Occupational Therapy

## 2020-12-21 ENCOUNTER — Other Ambulatory Visit: Payer: Self-pay

## 2020-12-21 ENCOUNTER — Ambulatory Visit: Payer: 59

## 2020-12-21 DIAGNOSIS — R2681 Unsteadiness on feet: Secondary | ICD-10-CM

## 2020-12-21 DIAGNOSIS — R42 Dizziness and giddiness: Secondary | ICD-10-CM

## 2020-12-21 DIAGNOSIS — R41842 Visuospatial deficit: Secondary | ICD-10-CM

## 2020-12-21 DIAGNOSIS — R2689 Other abnormalities of gait and mobility: Secondary | ICD-10-CM

## 2020-12-21 DIAGNOSIS — R41840 Attention and concentration deficit: Secondary | ICD-10-CM

## 2020-12-21 DIAGNOSIS — R29818 Other symptoms and signs involving the nervous system: Secondary | ICD-10-CM

## 2020-12-21 NOTE — Therapy (Signed)
[facility] [address] [place], [place], [address] Phone: [phone]   Fax:  [phone]  Physical Therapy Treatment  Patient Details  Name: Lydia Jenkins MRN: [id number] Date of Birth: [birth year] Referring Provider (PT): Vallarie Mare, MD   Encounter Date: 12/21/2020   PT End of Session - 12/21/20 0930     Visit Number 12    Number of Visits 17    Date for PT Re-Evaluation 01/03/21    Authorization Type UHC [id number]    PT Start Time 0931    PT Stop Time 1015    PT Time Calculation (min) 44 min    Equipment Utilized During Treatment Gait belt    Activity Tolerance Patient tolerated treatment well    Behavior During Therapy WFL for tasks assessed/performed             Past Medical History:  Diagnosis Date   Abnormal Pap smear    Anemia    iron deficient   Anxiety    Breast disorder    precencerous lesion, marker placed   Complication of anesthesia    Depression    Family history of adverse reaction to anesthesia    Hiatal hernia    Hypothyroidism    Liver dysfunction    Menorrhagia    Migraine headache with aura    PONV (postoperative nausea and vomiting)     Past Surgical History:  Procedure Laterality Date   ABDOMINOPLASTY  5885   APPLICATION OF CRANIAL NAVIGATION N/A 10/21/2020   Procedure: APPLICATION OF CRANIAL NAVIGATION;  Surgeon: Vallarie Mare, MD;  Location: [facility];  Service: Neurosurgery;  Laterality: N/A;   AUGMENTATION MAMMAPLASTY Bilateral 2009   BILIARY STENT PLACEMENT N/A 12/10/2017   Procedure: BILIARY STENT PLACEMENT;  Surgeon: Carol Ada, MD;  Location: [facility];  Service: Endoscopy;  Laterality: N/A;  plastic 8.5 x 5 stent placed   BREAST BIOPSY Left 2010   precancerous bx   CRANIOTOMY Left 10/21/2020   Procedure: LEFT FRONTAL CRANIOTOMY FOR RESECTION OF MENINGIOMA;  Surgeon: Vallarie Mare, MD;  Location: [facility];  Service: Neurosurgery;  Laterality: Left;    CRYOTHERAPY     cervix-3x   ENDOMETRIAL ABLATION  2010   ERCP N/A 12/10/2017   Procedure: ENDOSCOPIC RETROGRADE CHOLANGIOPANCREATOGRAPHY (ERCP);  Surgeon: Carol Ada, MD;  Location: [facility];  Service: Endoscopy;  Laterality: N/A;  Balloon sweep of duct   LAPAROSCOPIC CHOLECYSTECTOMY  2008   SPHINCTEROTOMY  12/10/2017   Procedure: SPHINCTEROTOMY;  Surgeon: Carol Ada, MD;  Location: [facility];  Service: Endoscopy;;   TUBAL LIGATION  1998    There were no vitals filed for this visit.   Subjective Assessment - 12/21/20 0934     Subjective No falls. Patient denies new changes/complaints. Did wake up this morning with a HA this morning. Reports it is very mild    Pertinent History hypothyroidism, hiatal hernia, spincter of Oddi dysfunciton, depression, and anxiety.    Limitations Walking    Patient Stated Goals wants to be back normal (if not better) - played tennis 3-5x a week and was doing crossfit, likes hiking    Pain Score 1     Pain Location Head    Pain Orientation Anterior;Right    Pain Descriptors / Indicators Headache    Pain Type Acute pain    Pain Onset Today                [facility] Adult PT Treatment/Exercise - 12/21/20 0001  Ambulation/Gait   Ambulation/Gait Yes    Ambulation/Gait Assistance 5: Supervision    Ambulation/Gait Assistance Details completed ambulation in hallway without either eye occluded 2 x 50' with no AD and close supervision from PT.    Ambulation Distance (Feet) 50 Feet   x 2   Assistive device None    Gait Pattern Step-through pattern    Ambulation Surface Level;Indoor      Therapeutic Activites    Therapeutic Activities Other Therapeutic Activities    Other Therapeutic Activities In hallway completed ambulation with addition of head turns initially with R eye occluded 4 x 25', then progressed to L eye occluded 4 x 25', increased symptoms with L eye occluded. mild veering noted but no assistance required from PT.              Vestibular Treatment/Exercise - 12/21/20 0001       Vestibular Treatment/Exercise   Vestibular Treatment Provided Gaze;Habituation    Habituation Exercises 180 degree Turns    Gaze Exercises X1 Viewing Horizontal;X1 Viewing Vertical      180 degree Turns   Number of Reps  5    Symptom Description  completed 180 deg turns in hallway, with 3-4 steps between and turning to various directions. Mild symptoms with R Eye Occluded. Eventually progressd to no eye occluded with ambulation with head turns and patietn did have increased symptoms but still able to maintain balance w/o assistance.      X1 Viewing Horizontal   Foot Position standing feet apart (R Eye Occluded, then L Eye Occluded)    Comments x 60 secs with R eye occluded, with L eye occluded x 30 seconds. require close placement of X with L eye occluded      X1 Viewing Vertical   Foot Position standing feet apart (R Eye Occluded, then L Eye Occluded)    Comments x 60 secs with R eye occluded, with L eye occluded x 30 seconds. require close placement of X with L eye occluded                     PT Short Term Goals - 12/07/20 1637       PT SHORT TERM GOAL #1   Title Pt will be independent with initial HEP in order to build upon functional gains made in therapy. ALL STGS DUE 12/03/20.    Baseline met on 12/07/20    Time 4    Period Weeks    Status Achieved    Target Date 12/03/20      PT SHORT TERM GOAL #2   Title Pt will improve condition 4 of mCTSIB to at least 15 seconds in order to demo improved vestibular input for balance.    Baseline 6.3 seconds on 11/09/20; 30 seconds on 12/07/20    Time 4    Period Weeks    Status Achieved      PT SHORT TERM GOAL #3   Title Pt will undergo further testing of BERG with STG/LTG written as appropriate.    Baseline 54/56 on 11/09/20, pt wearing glasses for double vision, would benefit from future assessment without glasses    Time 4    Period Weeks    Status Achieved       PT SHORT TERM GOAL #4   Title Pt will ambulate at least 36' with LRAD with supervision over indoor level surfaces in order to demo improved household mobility.    Baseline met with no AD.  Time 4    Period Weeks    Status Achieved      PT SHORT TERM GOAL #5   Title Pt will improve gait speed to at least 2.2 ft/sec with RW vs. LRAD in order to demo improved community mobility.    Baseline previously 1.84 ft/sec with RW, 9.19 = 3.56 ft/sec with no AD and R eye covered.    Time 4    Period Weeks    Status Achieved               PT Long Term Goals - 12/07/20 2045       PT LONG TERM GOAL #1   Title BERG goal/higher level balance test goal to be written as appropriate. ALL LTGS DUE 12/31/20    Time 8    Period Weeks    Status New      PT LONG TERM GOAL #2   Title Pt will improve gait speed to at least 3.9 ft/sec with no AD and no eye patch to demo improved community mobility.    Baseline 1.84 ft/sec with RW; 9.19 = 3.56 ft/sec R eye covered.    Time 8    Period Weeks    Status Revised      PT LONG TERM GOAL #3   Title Pt will ambulate at least 500' outdoors over paved/unlevel surfaces with supervision and LRAD/no AD in order to demo improved community mobility.    Time 8    Period Weeks    Status New      PT LONG TERM GOAL #4   Title Vestibular/mCTSIB goal to be written as appropriate.    Baseline not yet assessed.    Time 8    Period Weeks    Status New      PT LONG TERM GOAL #5   Title Pt will perform 16 steps using single handrail and step through pattern with supervision in order to be able to go up to the 2nd floor of her house.    Baseline unable to go upstairs    Time 8    Period Weeks    Status New                   Plan - 12/21/20 1255     Clinical Impression Statement Continued session focused on VOR and habituation with trialing alternating eye occluded and progressing to no occlusion to further slowly progession visual input. Patient  tolerating well. Most challenge noted without vision occluded and 180 deg turns. Will continue to progress toward all LTGs.    Personal Factors and Comorbidities Comorbidity 3+;Past/Current Experience    Comorbidities hypothyroidism, hiatal hernia, spincter of Oddi dysfunciton, depression, and anxiety.    Examination-Activity Limitations Stairs;Transfers;Squat;Locomotion Level    Examination-Participation Restrictions Community Activity;Driving;Cleaning;Laundry   playing tennis   Stability/Clinical Decision Making Evolving/Moderate complexity    Rehab Potential Good    PT Frequency 2x / week    PT Duration 8 weeks    PT Treatment/Interventions ADLs/Self Care Home Management;Stair training;Gait training;DME Instruction;Functional mobility training;Therapeutic activities;Therapeutic exercise;Neuromuscular re-education;Balance training;Patient/family education;Vestibular;Visual/perceptual remediation/compensation    PT Next Visit Plan continue with coordinated eye and head movements and standing balance on unlevel surfaces/vision removed and head motions (and SLS tasks). dynamic gait with no AD. progress gaze stabilzation (VOR x1 with R eye occluded). 180 deg turn habituation.  R eye occluded vs. no eyes occluded.    PT Home Exercise Plan [id number]    Consulted and Agree with Plan  of Care Patient             Patient will benefit from skilled therapeutic intervention in order to improve the following deficits and impairments:  Abnormal gait, Decreased activity tolerance, Decreased balance, Dizziness, Decreased strength, Impaired vision/preception, Decreased endurance  Visit Diagnosis: Unsteadiness on feet  Other abnormalities of gait and mobility  Dizziness and giddiness     Problem List Patient Active Problem List   Diagnosis Date Noted   Brain mass 10/21/2020   S/P resection of meningioma 10/21/2020   Chronic headaches 10/14/2020   Frontal mass of brain 10/13/2020   Elevated LFTs     Dilated cbd, acquired    Abdominal pain 12/06/2017   Hypothyroidism 12/06/2017   Night sweats 03/11/2015   S/P endometrial ablation 03/12/2013   Premenstrual syndrome 03/12/2013   Anemia 08/14/2012   Pelvic pain in female 08/13/2012   Menorrhagia     Jones Bales, PT, DPT 12/21/2020, 12:58 PM  [facility] [address] [place], [place], [address] Phone: [phone]   Fax:  [phone]  Name: Lydia Jenkins MRN: [id number] Date of Birth: 04-04-1971

## 2020-12-22 NOTE — Therapy (Signed)
[facility] [address] [place], [place], [address] Phone: [phone]   Fax:  [phone]  Occupational Therapy Treatment & Recertification  Patient Details  Name: Lydia Jenkins MRN: [id number] Date of Birth: 05/08/1971 Referring Provider (OT): Duffy Rhody, MD   Encounter Date: 12/21/2020   OT End of Session - 12/21/20 1016     Visit Number 12    Number of Visits 24   + 12 visits at renewal 12/21/20   Date for OT Re-Evaluation 02/01/21    Authorization Type UHC    Authorization Time Period [id number] No Auth    OT Start Time 1015    OT Stop Time 1100    OT Time Calculation (min) 45 min    Activity Tolerance Patient tolerated treatment well    Behavior During Therapy WFL for tasks assessed/performed             Past Medical History:  Diagnosis Date   Abnormal Pap smear    Anemia    iron deficient   Anxiety    Breast disorder    precencerous lesion, marker placed   Complication of anesthesia    Depression    Family history of adverse reaction to anesthesia    Hiatal hernia    Hypothyroidism    Liver dysfunction    Menorrhagia    Migraine headache with aura    PONV (postoperative nausea and vomiting)     Past Surgical History:  Procedure Laterality Date   ABDOMINOPLASTY  [id number]   APPLICATION OF CRANIAL NAVIGATION N/A 10/21/2020   Procedure: APPLICATION OF CRANIAL NAVIGATION;  Surgeon: Vallarie Mare, MD;  Location: [facility];  Service: Neurosurgery;  Laterality: N/A;   AUGMENTATION MAMMAPLASTY Bilateral 2009   BILIARY STENT PLACEMENT N/A 12/10/2017   Procedure: BILIARY STENT PLACEMENT;  Surgeon: Carol Ada, MD;  Location: [facility];  Service: Endoscopy;  Laterality: N/A;  plastic 8.5 x 5 stent placed   BREAST BIOPSY Left 2010   precancerous bx   CRANIOTOMY Left 10/21/2020   Procedure: LEFT FRONTAL CRANIOTOMY FOR RESECTION OF MENINGIOMA;  Surgeon: Vallarie Mare, MD;  Location: [facility];   Service: Neurosurgery;  Laterality: Left;   CRYOTHERAPY     cervix-3x   ENDOMETRIAL ABLATION  2010   ERCP N/A 12/10/2017   Procedure: ENDOSCOPIC RETROGRADE CHOLANGIOPANCREATOGRAPHY (ERCP);  Surgeon: Carol Ada, MD;  Location: [facility];  Service: Endoscopy;  Laterality: N/A;  Balloon sweep of duct   LAPAROSCOPIC CHOLECYSTECTOMY  2008   SPHINCTEROTOMY  12/10/2017   Procedure: SPHINCTEROTOMY;  Surgeon: Carol Ada, MD;  Location: [facility];  Service: Endoscopy;;   TUBAL LIGATION  1998    There were no vitals filed for this visit.   Subjective Assessment - 12/21/20 1015     Subjective  "my anxiety is getting higher" (leaving for trip on saturday). Pt reports nausea d/t vestibular therapy with PT.    Pertinent History sphincter of oddi dysfunction, hiatal hernia, hypothyroidism, depression, anxiety    Limitations no driving    Patient Stated Goals "get back to what I was before if not better"    Currently in Pain? No/denies                          OT Treatments/Exercises (OP) - 12/21/20 1023       Visual/Perceptual Exercises   Copy this Image Pegboard    Pegboard At approx 5 feet distance and vertical head and eye turns  for copying pattern at table top. Pt reports min diplopia for approx 1 sec before focusing after each vertical head and eye movement.    Scanning Tabletop;Environmental    Scanning - Environmental Environmental Scanning with locating 16/16 on first pass and with 100% accuracy.Pt reports significant diplopia with looking into the distance but reports could focus on the cards after spotting them. Pt reports a little nausea after activity. Pt only reports disturbance with gazing into the distance vs short distance focus.    Scanning - Tabletop Connect Four Chips placing chips into board/frame with scanning vertical while placing chips into board (elevated surface approx 8"). Pt with mod difficulty. Depth perception challenges with right eye occluded.  moved to no occlusion with no difficulty with task and no reports of double vision. Pt reports double vision when looking in the distance past activity.  Tabletop Scanning with no occlusion (and without glasses) and  80/80 and 100% accuracy. Pt reports no double vision but fatigue and blurriness after looking at it for a while. Resets with closing eyes for a second.  Constant Therapy Same Symbol Level 7 with 91% accuracy and 33.91s response time.                      OT Short Term Goals - 12/21/20 1032       OT SHORT TERM GOAL #1   Title Pt will be independent with diplopia HEP    Time 4    Period Weeks    Status Achieved    Target Date 12/03/20      OT SHORT TERM GOAL #2   Title Pt will perform environmental scanning with 90% Accuracy and no reports of diplopia with occlusion PRN.    Time 4    Period Weeks    Status Achieved      OT SHORT TERM GOAL #3   Title Pt will be independent with occlusion schedule and recommendations per OT PRN    Time 4    Period Weeks    Status Achieved      OT SHORT TERM GOAL #4   Title Pt will perform simple warm meal prep and/or light housekeeping with no reports of diplopia with occlusion PRN and with mod I.    Time 4    Period Weeks    Status Achieved               OT Long Term Goals - 12/21/20 1033       OT LONG TERM GOAL #1   Title Pt will be independent with updated HEPs    Time 8    Period Weeks    Status On-going      OT LONG TERM GOAL #2   Title Pt will perform environmental scanning with cognitive component with 95% accuracy or greater and with no reports of diplopia with occlusion only PRN.    Time 8    Period Weeks    Status On-going   100% standard environmental scanning without cog component with no occlusion. distance vision/gaze with diplopia present but able to focus on cards with single vision.     OT LONG TERM GOAL #3   Title Pt will perform mod complex meal prep (i.e. following instructions on side  of rice) with mod I and no reports of diplopia with occlusion PRN    Time 8    Period Weeks    Status Achieved   completing this at home per pt report.  OT LONG TERM GOAL #4   Title Pt will tolerate visual activity with head turns and/or eye movements with no occlusion for 5 minutes or greater with no reports of disturbance/dizziness.    Time 8    Period Weeks    Status Revised                   Plan - 12/21/20 1040     Clinical Impression Statement This note is to recertify for continued occupational therapy. Pt has progressed with increasing area for single vision and tolerating more activities without occlusion and continues to progress towards unmet goals. Skilled occupational therapy continues to be recommended to target remaining goals.    OT Occupational Profile and History Problem Focused Assessment - Including review of records relating to presenting problem    Occupational performance deficits (Please refer to evaluation for details): ADL's;IADL's    Body Structure / Function / Physical Skills Vision;Mobility;GMC;Strength;IADL;ADL;Decreased knowledge of use of DME    Clinical Decision Making Limited treatment options, no task modification necessary    Comorbidities Affecting Occupational Performance: None    Modification or Assistance to Complete Evaluation  No modification of tasks or assist necessary to complete eval    OT Frequency 2x / week    OT Duration 6 weeks   2x/week for 6 weeks at renewal 12/21/20   OT Treatment/Interventions Self-care/ADL training;DME and/or AE instruction;Visual/perceptual remediation/compensation;Patient/family education;Therapist, nutritional;Therapeutic exercise;Neuromuscular education;Therapeutic activities    Plan recertification completed 12/21/20, continue visual activities with head and eye turns without occlusion for increased tolerance    Consulted and Agree with Plan of Care Patient             Patient will benefit  from skilled therapeutic intervention in order to improve the following deficits and impairments:   Body Structure / Function / Physical Skills: Vision, Mobility, GMC, Strength, IADL, ADL, Decreased knowledge of use of DME       Visit Diagnosis: Unsteadiness on feet  Other abnormalities of gait and mobility  Dizziness and giddiness  Attention and concentration deficit  Visuospatial deficit  Other symptoms and signs involving the nervous system    Problem List Patient Active Problem List   Diagnosis Date Noted   Brain mass 10/21/2020   S/P resection of meningioma 10/21/2020   Chronic headaches 10/14/2020   Frontal mass of brain 10/13/2020   Elevated LFTs    Dilated cbd, acquired    Abdominal pain 12/06/2017   Hypothyroidism 12/06/2017   Night sweats 03/11/2015   S/P endometrial ablation 03/12/2013   Premenstrual syndrome 03/12/2013   Anemia 08/14/2012   Pelvic pain in female 08/13/2012   Menorrhagia     Zachery Conch MOT, OTR/L  12/22/2020, 7:38 AM  [facility] [address] [place], [place], [address] Phone: [phone]   Fax:  [phone]  Name: TYSHAUNA ROSENBERG MRN: [id number] Date of Birth: 24-Dec-1970

## 2020-12-23 ENCOUNTER — Ambulatory Visit: Payer: 59

## 2020-12-23 ENCOUNTER — Other Ambulatory Visit: Payer: Self-pay

## 2020-12-23 ENCOUNTER — Encounter: Payer: 59 | Admitting: Occupational Therapy

## 2020-12-23 ENCOUNTER — Ambulatory Visit: Payer: 59 | Admitting: Occupational Therapy

## 2020-12-23 ENCOUNTER — Encounter: Payer: Self-pay | Admitting: Occupational Therapy

## 2020-12-23 DIAGNOSIS — R2689 Other abnormalities of gait and mobility: Secondary | ICD-10-CM

## 2020-12-23 DIAGNOSIS — R41842 Visuospatial deficit: Secondary | ICD-10-CM | POA: Diagnosis not present

## 2020-12-23 DIAGNOSIS — R42 Dizziness and giddiness: Secondary | ICD-10-CM

## 2020-12-23 DIAGNOSIS — R41840 Attention and concentration deficit: Secondary | ICD-10-CM

## 2020-12-23 DIAGNOSIS — R41841 Cognitive communication deficit: Secondary | ICD-10-CM

## 2020-12-23 DIAGNOSIS — R2681 Unsteadiness on feet: Secondary | ICD-10-CM

## 2020-12-23 NOTE — Therapy (Signed)
[facility] [address] [place], [place], [address] Phone: [phone]   Fax:  [phone]  Speech Language Pathology Treatment  Patient Details  Name: Lydia Jenkins MRN: [id number] Date of Birth: Jul 11, 1970 Referring Provider (SLP): Vallarie Mare, MD   Encounter Date: 12/23/2020   End of Session - 12/23/20 2220     Visit Number 3    Number of Visits 17    Date for SLP Re-Evaluation 02/07/21    SLP Start Time 1017    SLP Stop Time  1100    SLP Time Calculation (min) 43 min    Activity Tolerance Patient tolerated treatment well             Past Medical History:  Diagnosis Date   Abnormal Pap smear    Anemia    iron deficient   Anxiety    Breast disorder    precencerous lesion, marker placed   Complication of anesthesia    Depression    Family history of adverse reaction to anesthesia    Hiatal hernia    Hypothyroidism    Liver dysfunction    Menorrhagia    Migraine headache with aura    PONV (postoperative nausea and vomiting)     Past Surgical History:  Procedure Laterality Date   ABDOMINOPLASTY  [id number]   APPLICATION OF CRANIAL NAVIGATION N/A 10/21/2020   Procedure: APPLICATION OF CRANIAL NAVIGATION;  Surgeon: Vallarie Mare, MD;  Location: [facility];  Service: Neurosurgery;  Laterality: N/A;   AUGMENTATION MAMMAPLASTY Bilateral 2009   BILIARY STENT PLACEMENT N/A 12/10/2017   Procedure: BILIARY STENT PLACEMENT;  Surgeon: Carol Ada, MD;  Location: [facility];  Service: Endoscopy;  Laterality: N/A;  plastic 8.5 x 5 stent placed   BREAST BIOPSY Left 2010   precancerous bx   CRANIOTOMY Left 10/21/2020   Procedure: LEFT FRONTAL CRANIOTOMY FOR RESECTION OF MENINGIOMA;  Surgeon: Vallarie Mare, MD;  Location: [facility];  Service: Neurosurgery;  Laterality: Left;   CRYOTHERAPY     cervix-3x   ENDOMETRIAL ABLATION  2010   ERCP N/A 12/10/2017   Procedure: ENDOSCOPIC RETROGRADE  CHOLANGIOPANCREATOGRAPHY (ERCP);  Surgeon: Carol Ada, MD;  Location: [facility];  Service: Endoscopy;  Laterality: N/A;  Balloon sweep of duct   LAPAROSCOPIC CHOLECYSTECTOMY  2008   SPHINCTEROTOMY  12/10/2017   Procedure: SPHINCTEROTOMY;  Surgeon: Carol Ada, MD;  Location: [facility];  Service: Endoscopy;;   TUBAL LIGATION  1998    There were no vitals filed for this visit.          ADULT SLP TREATMENT - 12/23/20 2217       General Information   Behavior/Cognition Alert;Pleasant mood;Cooperative      Treatment Provided   Treatment provided Cognitive-Linquistic      Cognitive-Linquistic Treatment   Treatment focused on Cognition;Patient/family/caregiver education    Skilled Treatment Pt reports alarm/reminder for AM meds has been helpful mostly but when she wakes up early and reminder goes off for 7 am she remains unsure if she has taken her AM med and this is concerning to her when it occurs. So SLP assisted pt thinking of way to modify her compensation so that there was no question for AM med administration. Curtistine told SLP her children are getting frustrated because she texts them multiple times a day about things to do for vacation and many times it is the same things. SLP assisted pt realize she should work on "to-do" for vacation for no more  than 45 mintues at the same time each weekday so as to not email/text people multiple times perday. If she has something she wants to tell peoplr about vacation she will add it to her to-do list and then she has a way to track previous items she has informed others about. Pt told SLP 5 things from her to-do list/reminder list. She states she is using her phone more now than she was prior to sx. SLP and pt agrees that appointmetn tomorrow is too early adn so she will come to next scheduled ST appointment.      Assessment / Recommendations / Plan   Plan Continue with current plan of care      Progression Toward Goals   Progression  toward goals Progressing toward goals              SLP Education - 12/23/20 2225     Education Details compensation strategies    Person(s) Educated Patient    Methods Explanation;Demonstration    Comprehension Verbalized understanding              SLP Short Term Goals - 12/23/20 2226       SLP SHORT TERM GOAL #1   Title Pt will use memory compensations for appointments, medicine management, and other daily activities with occasional min A over 2 sessions    Baseline 12-23-20    Time 4    Period Weeks    Status On-going      SLP SHORT TERM GOAL #2   Title Pt will demonstrate awareness of errors on mod complex structured tasks with 80% accuracy given rare min A over 2 sessions    Time 4    Period Weeks    Status On-going      SLP SHORT TERM GOAL #3   Title Pt will recall 3 items on daily to-do list with use of memory strategy with rare min A over 2 sessions    Baseline 12-23-20    Time 4    Period Weeks    Status On-going      SLP SHORT TERM GOAL #4   Title Pt will independently orient self to day of week with use of memory strategy for 2/3 opportunities given rare min A    Baseline 12-23-20    Time 4    Period Weeks    Status On-going      SLP SHORT TERM GOAL #5   Title Pt will complete cognitive communication PROM in first 1-2 ST sessions    Time 2    Period Weeks    Status On-going              SLP Long Term Goals - 12/23/20 2227       SLP LONG TERM GOAL #1   Title Pt will use memory compensations for appointments, medicine management, and other daily activities with rare min A over 2 sessions    Baseline 12-23-20    Time 8    Period Weeks    Status On-going      SLP LONG TERM GOAL #2   Title Pt will demonstrate awareness of errors on mod complex structured tasks with 90% accuracy given rare min A over 2 sessions    Time 8    Period Weeks    Status On-going      SLP LONG TERM GOAL #3   Title Pt will recall 5 items on daily to-do list with  use of memory strategies with rare min  A over 2 sessions    Baseline 12-23-20    Time 8    Period Weeks    Status On-going      SLP LONG TERM GOAL #4   Title Pt will report improved cognitive functioning by 2 points by last ST session    Time 8    Period Weeks    Status On-going              Plan - 12/23/20 2226     Clinical Impression Statement Darleene presents for OPST intervention secondary to meningiomia resection in May 2022. SLP educated and demonstrated use of memory and attention compensations to aid functioning for daily life. SLP trained use of external aids for medication management and use of compensations to recall items from daily to-do lists. Skilled ST is warranted to address mild cognitive communication changes to maximize functional independence and optimize functioning.    Speech Therapy Frequency 2x / week    Duration 8 weeks    Treatment/Interventions Compensatory strategies;Cueing hierarchy;Functional tasks;Patient/family education;Environmental controls;Cognitive reorganization;Compensatory techniques;Multimodal communcation approach;Language facilitation;Internal/external aids;SLP instruction and feedback    Potential to Achieve Goals Good    SLP Home Exercise Plan provided    Consulted and Agree with Plan of Care Patient             Patient will benefit from skilled therapeutic intervention in order to improve the following deficits and impairments:   Cognitive communication deficit    Problem List Patient Active Problem List   Diagnosis Date Noted   Brain mass 10/21/2020   S/P resection of meningioma 10/21/2020   Chronic headaches 10/14/2020   Frontal mass of brain 10/13/2020   Elevated LFTs    Dilated cbd, acquired    Abdominal pain 12/06/2017   Hypothyroidism 12/06/2017   Night sweats 03/11/2015   S/P endometrial ablation 03/12/2013   Premenstrual syndrome 03/12/2013   Anemia 08/14/2012   Pelvic pain in female 08/13/2012   Menorrhagia      Brezlyn Manrique. ,MS, CCC-SLP  12/23/2020, 10:28 PM  [facility] [address] [place], [place], [address] Phone: [phone]   Fax:  [phone]   Name: VENISA ESTUPINAN MRN: [id number] Date of Birth: 03-Jul-1970

## 2020-12-23 NOTE — Therapy (Addendum)
[facility] [address] [place], [place], [address] Phone: [phone]   Fax:  [phone]  Occupational Therapy Treatment  Patient Details  Name: Lydia Jenkins MRN: [id number] Date of Birth: January 24, 1971 Referring Provider (OT): Duffy Rhody, MD   Encounter Date: 12/23/2020   OT End of Session - 12/23/20 1108     Visit Number 13    Number of Visits 24   + 12 visits at renewal 12/21/20   Date for OT Re-Evaluation 02/01/21    Authorization Type [employer]    Authorization Time Period VL:MN No Auth    OT Start Time 1107    OT Stop Time 1145    OT Time Calculation (min) 38 min    Activity Tolerance Patient tolerated treatment well    Behavior During Therapy WFL for tasks assessed/performed             Past Medical History:  Diagnosis Date   Abnormal Pap smear    Anemia    iron deficient   Anxiety    Breast disorder    precencerous lesion, marker placed   Complication of anesthesia    Depression    Family history of adverse reaction to anesthesia    Hiatal hernia    Hypothyroidism    Liver dysfunction    Menorrhagia    Migraine headache with aura    PONV (postoperative nausea and vomiting)     Past Surgical History:  Procedure Laterality Date   ABDOMINOPLASTY  [id number]   APPLICATION OF CRANIAL NAVIGATION N/A 10/21/2020   Procedure: APPLICATION OF CRANIAL NAVIGATION;  Surgeon: Vallarie Mare, MD;  Location: [facility];  Service: Neurosurgery;  Laterality: N/A;   AUGMENTATION MAMMAPLASTY Bilateral 2009   BILIARY STENT PLACEMENT N/A 12/10/2017   Procedure: BILIARY STENT PLACEMENT;  Surgeon: Carol Ada, MD;  Location: [facility];  Service: Endoscopy;  Laterality: N/A;  plastic 8.5 x 5 stent placed   BREAST BIOPSY Left 2010   precancerous bx   CRANIOTOMY Left 10/21/2020   Procedure: LEFT FRONTAL CRANIOTOMY FOR RESECTION OF MENINGIOMA;  Surgeon: Vallarie Mare, MD;  Location: [facility];  Service: Neurosurgery;   Laterality: Left;   CRYOTHERAPY     cervix-3x   ENDOMETRIAL ABLATION  2010   ERCP N/A 12/10/2017   Procedure: ENDOSCOPIC RETROGRADE CHOLANGIOPANCREATOGRAPHY (ERCP);  Surgeon: Carol Ada, MD;  Location: [facility];  Service: Endoscopy;  Laterality: N/A;  Balloon sweep of duct   LAPAROSCOPIC CHOLECYSTECTOMY  2008   SPHINCTEROTOMY  12/10/2017   Procedure: SPHINCTEROTOMY;  Surgeon: Carol Ada, MD;  Location: [facility];  Service: Endoscopy;;   TUBAL LIGATION  1998    There were no vitals filed for this visit.   Subjective Assessment - 12/23/20 1107     Subjective  Feel pretty good but was nauseated overnight - had to get up and take some nausea medicine    Pertinent History sphincter of oddi dysfunction, hiatal hernia, hypothyroidism, depression, anxiety    Limitations no driving    Patient Stated Goals "get back to what I was before if not better"    Currently in Pain? No/denies                          OT Treatments/Exercises (OP) - 12/23/20 1112       Visual/Perceptual Exercises   Scanning Tabletop    Scanning - Tabletop without occlusion and with glasses with connect the dots 1-60 with minimal eye movements  while keeping head stationary. Pt took increased time to get picture to clear up before starting and completed with minimal to no complaints of diplopia. Pt completed Hidden pictures (science fair) without glasses and without occlusion with increased time but with no reports of disturbance and diplopia with horizontal eye movements at table top                      OT Short Term Goals - 12/21/20 1032       OT SHORT TERM GOAL #1   Title Pt will be independent with diplopia HEP    Time 4    Period Weeks    Status Achieved    Target Date 12/03/20      OT SHORT TERM GOAL #2   Title Pt will perform environmental scanning with 90% Accuracy and no reports of diplopia with occlusion PRN.    Time 4    Period Weeks    Status Achieved       OT SHORT TERM GOAL #3   Title Pt will be independent with occlusion schedule and recommendations per OT PRN    Time 4    Period Weeks    Status Achieved      OT SHORT TERM GOAL #4   Title Pt will perform simple warm meal prep and/or light housekeeping with no reports of diplopia with occlusion PRN and with mod I.    Time 4    Period Weeks    Status Achieved               OT Long Term Goals - 12/21/20 1033       OT LONG TERM GOAL #1   Title Pt will be independent with updated HEPs    Time 8    Period Weeks    Status On-going      OT LONG TERM GOAL #2   Title Pt will perform environmental scanning with cognitive component with 95% accuracy or greater and with no reports of diplopia with occlusion only PRN.    Time 8    Period Weeks    Status On-going   100% standard environmental scanning without cog component with no occlusion. distance vision/gaze with diplopia present but able to focus on cards with single vision.     OT LONG TERM GOAL #3   Title Pt will perform mod complex meal prep (i.e. following instructions on side of rice) with mod I and no reports of diplopia with occlusion PRN    Time 8    Period Weeks    Status Achieved   completing this at home per pt report.     OT LONG TERM GOAL #4   Title Pt will tolerate visual activity with head turns and/or eye movements with no occlusion for 5 minutes or greater with no reports of disturbance/dizziness.    Time 8    Period Weeks    Status Revised                   Plan - 12/23/20 1159     Clinical Impression Statement Pt progressing with tolerating more without occlusion - better near point without glasses.    OT Occupational Profile and History Problem Focused Assessment - Including review of records relating to presenting problem    Occupational performance deficits (Please refer to evaluation for details): ADL's;IADL's    Body Structure / Function / Physical Skills  Vision;Mobility;GMC;Strength;IADL;ADL;Decreased knowledge of use of DME  Clinical Decision Making Limited treatment options, no task modification necessary    Comorbidities Affecting Occupational Performance: None    Modification or Assistance to Complete Evaluation  No modification of tasks or assist necessary to complete eval    OT Frequency 2x / week    OT Duration 6 weeks   2x/week for 6 weeks at renewal 12/21/20   OT Treatment/Interventions Self-care/ADL training;DME and/or AE instruction;Visual/perceptual remediation/compensation;Patient/family education;Therapist, nutritional;Therapeutic exercise;Neuromuscular education;Therapeutic activities    Plan continue visual activities with head and eye turns without occlusion for increased tolerance    Consulted and Agree with Plan of Care Patient             Patient will benefit from skilled therapeutic intervention in order to improve the following deficits and impairments:   Body Structure / Function / Physical Skills: Vision, Mobility, GMC, Strength, IADL, ADL, Decreased knowledge of use of DME       Visit Diagnosis: Unsteadiness on feet  Other abnormalities of gait and mobility  Dizziness and giddiness  Attention and concentration deficit  Visuospatial deficit    Problem List Patient Active Problem List   Diagnosis Date Noted   Brain mass 10/21/2020   S/P resection of meningioma 10/21/2020   Chronic headaches 10/14/2020   Frontal mass of brain 10/13/2020   Elevated LFTs    Dilated cbd, acquired    Abdominal pain 12/06/2017   Hypothyroidism 12/06/2017   Night sweats 03/11/2015   S/P endometrial ablation 03/12/2013   Premenstrual syndrome 03/12/2013   Anemia 08/14/2012   Pelvic pain in female 08/13/2012   Menorrhagia     Zachery Conch MOT, OTR/L  12/23/2020, 12:16 PM  [facility] [address] [place], [place], [address] Phone:  [phone]   Fax:  [phone]  Name: JAYDALIZ BENTE MRN: [id number] Date of Birth: 11-25-1970

## 2020-12-23 NOTE — Patient Instructions (Signed)
   Spend 30-45 minutes on vacation to-do list each day - add new ideas to the to-do list you have and that way you have record of what you've already told people.

## 2020-12-24 ENCOUNTER — Encounter: Payer: Self-pay | Admitting: Physical Therapy

## 2020-12-24 ENCOUNTER — Ambulatory Visit: Payer: 59 | Admitting: Physical Therapy

## 2020-12-24 ENCOUNTER — Ambulatory Visit: Payer: 59

## 2020-12-24 ENCOUNTER — Encounter: Payer: 59 | Admitting: Occupational Therapy

## 2020-12-24 DIAGNOSIS — R41842 Visuospatial deficit: Secondary | ICD-10-CM | POA: Diagnosis not present

## 2020-12-24 DIAGNOSIS — R2689 Other abnormalities of gait and mobility: Secondary | ICD-10-CM

## 2020-12-24 DIAGNOSIS — R42 Dizziness and giddiness: Secondary | ICD-10-CM

## 2020-12-24 DIAGNOSIS — R2681 Unsteadiness on feet: Secondary | ICD-10-CM

## 2020-12-24 DIAGNOSIS — R29818 Other symptoms and signs involving the nervous system: Secondary | ICD-10-CM

## 2020-12-24 NOTE — Therapy (Addendum)
[facility] [address] [place], [place], [address] Phone: [phone]   Fax:  [phone]  Physical Therapy Treatment  Patient Details  Name: NALIYA GISH MRN: [id number] Date of Birth: 06/18/1970 Referring Provider (PT): Vallarie Mare, MD   Encounter Date: 12/24/2020   PT End of Session - 12/24/20 0931     Visit Number 13    Number of Visits 17    Date for PT Re-Evaluation 01/03/21    Authorization Type [employer] [id number]    PT Start Time 0848    PT Stop Time 0931    PT Time Calculation (min) 43 min    Equipment Utilized During Treatment Gait belt    Activity Tolerance Patient tolerated treatment well    Behavior During Therapy WFL for tasks assessed/performed             Past Medical History:  Diagnosis Date   Abnormal Pap smear    Anemia    iron deficient   Anxiety    Breast disorder    precencerous lesion, marker placed   Complication of anesthesia    Depression    Family history of adverse reaction to anesthesia    Hiatal hernia    Hypothyroidism    Liver dysfunction    Menorrhagia    Migraine headache with aura    PONV (postoperative nausea and vomiting)     Past Surgical History:  Procedure Laterality Date   ABDOMINOPLASTY  3710   APPLICATION OF CRANIAL NAVIGATION N/A 10/21/2020   Procedure: APPLICATION OF CRANIAL NAVIGATION;  Surgeon: Vallarie Mare, MD;  Location: [facility];  Service: Neurosurgery;  Laterality: N/A;   AUGMENTATION MAMMAPLASTY Bilateral 2009   BILIARY STENT PLACEMENT N/A 12/10/2017   Procedure: BILIARY STENT PLACEMENT;  Surgeon: Carol Ada, MD;  Location: [facility];  Service: Endoscopy;  Laterality: N/A;  plastic 8.5 x 5 stent placed   BREAST BIOPSY Left 2010   precancerous bx   CRANIOTOMY Left 10/21/2020   Procedure: LEFT FRONTAL CRANIOTOMY FOR RESECTION OF MENINGIOMA;  Surgeon: Vallarie Mare, MD;  Location: [facility];  Service: Neurosurgery;  Laterality: Left;    CRYOTHERAPY     cervix-3x   ENDOMETRIAL ABLATION  2010   ERCP N/A 12/10/2017   Procedure: ENDOSCOPIC RETROGRADE CHOLANGIOPANCREATOGRAPHY (ERCP);  Surgeon: Carol Ada, MD;  Location: [facility];  Service: Endoscopy;  Laterality: N/A;  Balloon sweep of duct   LAPAROSCOPIC CHOLECYSTECTOMY  2008   SPHINCTEROTOMY  12/10/2017   Procedure: SPHINCTEROTOMY;  Surgeon: Carol Ada, MD;  Location: [facility];  Service: Endoscopy;;   TUBAL LIGATION  1998    There were no vitals filed for this visit.   Subjective Assessment - 12/24/20 0851     Subjective Has a mild headache again this morning. Has been trying to spend more time with her patch off - in the morning and in the evening.    Pertinent History hypothyroidism, hiatal hernia, spincter of Oddi dysfunciton, depression, and anxiety.    Limitations Walking    Patient Stated Goals wants to be back normal (if not better) - played tennis 3-5x a week and was doing crossfit, likes hiking    Currently in Pain? No/denies    Pain Onset Today                [facility] PT Assessment - 12/24/20 0854       Functional Gait  Assessment   Gait assessed  Yes    Gait Level Surface Walks  20 ft in less than 7 sec but greater than 5.5 sec, uses assistive device, slower speed, mild gait deviations, or deviates 6-10 in outside of the 12 in walkway width.   6.71   Change in Gait Speed Able to smoothly change walking speed without loss of balance or gait deviation. Deviate no more than 6 in outside of the 12 in walkway width.    Gait with Horizontal Head Turns Performs head turns with moderate changes in gait velocity, slows down, deviates 10-15 in outside 12 in walkway width but recovers, can continue to walk.    Gait with Vertical Head Turns Performs task with slight change in gait velocity (eg, minor disruption to smooth gait path), deviates 6 - 10 in outside 12 in walkway width or uses assistive device    Gait and Pivot Turn Pivot turns safely within 3 sec  and stops quickly with no loss of balance.    Step Over Obstacle Is able to step over one shoe box (4.5 in total height) without changing gait speed. No evidence of imbalance.    Gait with Narrow Base of Support Ambulates 7-9 steps.    Gait with Eyes Closed Walks 20 ft, slow speed, abnormal gait pattern, evidence for imbalance, deviates 10-15 in outside 12 in walkway width. Requires more than 9 sec to ambulate 20 ft.   12 seconds   Ambulating Backwards Walks 20 ft, uses assistive device, slower speed, mild gait deviations, deviates 6-10 in outside 12 in walkway width.   22 seconds   Steps Alternating feet, no rail.    Total Score 21    FGA comment: 21/30 = medium fall risk, with R eye occluded                           [facility] Adult PT Treatment/Exercise - 12/24/20 0918       Ambulation/Gait   Ambulation/Gait Yes    Ambulation/Gait Assistance 5: Supervision    Ambulation/Gait Assistance Details throughout majority of session with R eye occluded, performed 2 x 50' in hallway without eyes occluded with pt reporting mild nausea and cues for reciprocal arm swing    Assistive device None    Gait Pattern Step-through pattern    Ambulation Surface Level;Indoor    Gait Comments --      High Level Balance   High Level Balance Activities Negotiating over obstacles    High Level Balance Comments obstacle course with stepping over obstacles of various heights, stepping stones for SLS, and standing on foam and alternating toe taps to cone and then stepping over - performed over 40' x4 reps total with R eye occluded, cues to relax BUE                 Balance Exercises - 12/24/20 0919       Balance Exercises: Standing   Marching Solid surface;Dynamic;Forwards;Retro;Limitations    Marching Limitations down and back in hallway forwards and backwards 2 x 30' with adding in reciprocal arm raise, initially having incr difficulty coordinating               PT Education -  12/24/20 0931     Education Details results of FGA    Person(s) Educated Patient    Methods Explanation    Comprehension Verbalized understanding              PT Short Term Goals - 12/07/20 1637       PT  SHORT TERM GOAL #1   Title Pt will be independent with initial HEP in order to build upon functional gains made in therapy. ALL STGS DUE 12/03/20.    Baseline met on 12/07/20    Time 4    Period Weeks    Status Achieved    Target Date 12/03/20      PT SHORT TERM GOAL #2   Title Pt will improve condition 4 of mCTSIB to at least 15 seconds in order to demo improved vestibular input for balance.    Baseline 6.3 seconds on 11/09/20; 30 seconds on 12/07/20    Time 4    Period Weeks    Status Achieved      PT SHORT TERM GOAL #3   Title Pt will undergo further testing of BERG with STG/LTG written as appropriate.    Baseline 54/56 on 11/09/20, pt wearing glasses for double vision, would benefit from future assessment without glasses    Time 4    Period Weeks    Status Achieved      PT SHORT TERM GOAL #4   Title Pt will ambulate at least 65' with LRAD with supervision over indoor level surfaces in order to demo improved household mobility.    Baseline met with no AD.    Time 4    Period Weeks    Status Achieved      PT SHORT TERM GOAL #5   Title Pt will improve gait speed to at least 2.2 ft/sec with RW vs. LRAD in order to demo improved community mobility.    Baseline previously 1.84 ft/sec with RW, 9.19 = 3.56 ft/sec with no AD and R eye covered.    Time 4    Period Weeks    Status Achieved               PT Long Term Goals - 12/24/20 1207       PT LONG TERM GOAL #1   Title BERG goal/higher level balance test goal to be written as appropriate. ALL LTGS DUE 12/31/20    Baseline FGA performed on 12/24/20 - scoring a 22/30 with medium fall risk    Time 8    Period Weeks    Status Achieved      PT LONG TERM GOAL #2   Title Pt will improve gait speed to at least 3.9  ft/sec with no AD and no eye patch to demo improved community mobility.    Baseline 1.84 ft/sec with RW; 9.19 = 3.56 ft/sec R eye covered.    Time 8    Period Weeks    Status Revised      PT LONG TERM GOAL #3   Title Pt will ambulate at least 500' outdoors over paved/unlevel surfaces with supervision and LRAD/no AD in order to demo improved community mobility.    Time 8    Period Weeks    Status New      PT LONG TERM GOAL #4   Title Vestibular/mCTSIB goal to be written as appropriate.    Baseline per STG, pt has performed 30 seconds of condition 4 of mCTSIB on 12/07/20    Time 8    Period Weeks    Status Achieved      PT LONG TERM GOAL #5   Title Pt will perform 16 steps using single handrail and step through pattern with supervision in order to be able to go up to the 2nd floor of her house.    Baseline pt  able to perform step through pattern, does need to use step to pattern when descending at home due to visual deficits.    Time 8    Period Weeks    Status Achieved                   Plan - 12/24/20 1208     Clinical Impression Statement Performed the FGA today with pt's R eye occluded with pt scoring a 22/30 indicating that pt is at a moderate risk for falls. Tried gait with no eye occluded in quiet hallway with pt reporting mild nausea and more guarded with gait. Will continue to progress towards LTGs.    Personal Factors and Comorbidities Comorbidity 3+;Past/Current Experience    Comorbidities hypothyroidism, hiatal hernia, spincter of Oddi dysfunciton, depression, and anxiety.    Examination-Activity Limitations Stairs;Transfers;Squat;Locomotion Level    Examination-Participation Restrictions Community Activity;Driving;Cleaning;Laundry   playing tennis   Stability/Clinical Decision Making Evolving/Moderate complexity    Rehab Potential Good    PT Frequency 2x / week    PT Duration 8 weeks    PT Treatment/Interventions ADLs/Self Care Home Management;Stair  training;Gait training;DME Instruction;Functional mobility training;Therapeutic activities;Therapeutic exercise;Neuromuscular re-education;Balance training;Patient/family education;Vestibular;Visual/perceptual remediation/compensation    PT Next Visit Plan will need re-cert when pt returns from vacation - check LTGs. would do another 2x week for 6 weeks. balance on unlevel surfaces/vision removed and head motions (and SLS tasks). dynamic gait with no AD. progress gaze stabilzation (VOR x1 with R eye occluded). 180 deg turn habituation.  R eye occluded vs. no eyes occluded.    PT Home Exercise Plan [id number]    Consulted and Agree with Plan of Care Patient             Patient will benefit from skilled therapeutic intervention in order to improve the following deficits and impairments:  Abnormal gait, Decreased activity tolerance, Decreased balance, Dizziness, Decreased strength, Impaired vision/preception, Decreased endurance  Visit Diagnosis: Other abnormalities of gait and mobility  Other symptoms and signs involving the nervous system  Dizziness and giddiness  Unsteadiness on feet     Problem List Patient Active Problem List   Diagnosis Date Noted   Brain mass 10/21/2020   S/P resection of meningioma 10/21/2020   Chronic headaches 10/14/2020   Frontal mass of brain 10/13/2020   Elevated LFTs    Dilated cbd, acquired    Abdominal pain 12/06/2017   Hypothyroidism 12/06/2017   Night sweats 03/11/2015   S/P endometrial ablation 03/12/2013   Premenstrual syndrome 03/12/2013   Anemia 08/14/2012   Pelvic pain in female 08/13/2012   Menorrhagia     Arliss Journey, PT, DPT  12/24/2020, 12:10 PM  [facility] [address] [place], [place], [address] Phone: [phone]   Fax:  [phone]  Name: NEKEISHA AURE MRN: [id number] Date of Birth: 01-10-1971

## 2020-12-27 ENCOUNTER — Encounter: Payer: 59 | Admitting: Occupational Therapy

## 2020-12-27 ENCOUNTER — Ambulatory Visit: Payer: 59 | Admitting: Physical Therapy

## 2020-12-30 ENCOUNTER — Encounter: Payer: 59 | Admitting: Occupational Therapy

## 2020-12-30 ENCOUNTER — Ambulatory Visit: Payer: 59

## 2021-01-06 ENCOUNTER — Ambulatory Visit: Payer: 59

## 2021-01-06 ENCOUNTER — Ambulatory Visit: Payer: 59 | Admitting: Occupational Therapy

## 2021-01-06 ENCOUNTER — Ambulatory Visit: Payer: 59 | Admitting: Physical Therapy

## 2021-01-07 ENCOUNTER — Ambulatory Visit: Payer: 59

## 2021-01-07 ENCOUNTER — Other Ambulatory Visit: Payer: Self-pay

## 2021-01-07 ENCOUNTER — Encounter: Payer: Self-pay | Admitting: Occupational Therapy

## 2021-01-07 ENCOUNTER — Ambulatory Visit: Payer: 59 | Admitting: Occupational Therapy

## 2021-01-07 ENCOUNTER — Encounter: Payer: Self-pay | Admitting: Physical Therapy

## 2021-01-07 ENCOUNTER — Ambulatory Visit: Payer: 59 | Attending: Family Medicine | Admitting: Physical Therapy

## 2021-01-07 DIAGNOSIS — R2681 Unsteadiness on feet: Secondary | ICD-10-CM | POA: Diagnosis present

## 2021-01-07 DIAGNOSIS — R29818 Other symptoms and signs involving the nervous system: Secondary | ICD-10-CM | POA: Diagnosis present

## 2021-01-07 DIAGNOSIS — R41842 Visuospatial deficit: Secondary | ICD-10-CM | POA: Diagnosis present

## 2021-01-07 DIAGNOSIS — R41840 Attention and concentration deficit: Secondary | ICD-10-CM | POA: Insufficient documentation

## 2021-01-07 DIAGNOSIS — R42 Dizziness and giddiness: Secondary | ICD-10-CM | POA: Diagnosis present

## 2021-01-07 DIAGNOSIS — R2689 Other abnormalities of gait and mobility: Secondary | ICD-10-CM | POA: Diagnosis present

## 2021-01-07 DIAGNOSIS — R41841 Cognitive communication deficit: Secondary | ICD-10-CM | POA: Insufficient documentation

## 2021-01-07 NOTE — Therapy (Signed)
[facility] [address] [place], [place], [address] Phone: [phone]   Fax:  [phone]  Physical Therapy Treatment/Re-Cert  Patient Details  Name: Lydia Jenkins MRN: [id number] Date of Birth: [birth year] Referring Provider (PT): Vallarie Mare, MD   Encounter Date: 01/07/2021   PT End of Session - 01/07/21 1129     Visit Number 14    Number of Visits 26    Date for PT Re-Evaluation 03/08/21    Authorization Type UHC [id number]    PT Start Time 0932    PT Stop Time 1015    PT Time Calculation (min) 43 min    Activity Tolerance Patient tolerated treatment well    Behavior During Therapy [facility] for tasks assessed/performed             Past Medical History:  Diagnosis Date   Abnormal Pap smear    Anemia    iron deficient   Anxiety    Breast disorder    precencerous lesion, marker placed   Complication of anesthesia    Depression    Family history of adverse reaction to anesthesia    Hiatal hernia    Hypothyroidism    Liver dysfunction    Menorrhagia    Migraine headache with aura    PONV (postoperative nausea and vomiting)     Past Surgical History:  Procedure Laterality Date   ABDOMINOPLASTY  0175   APPLICATION OF CRANIAL NAVIGATION N/A 10/21/2020   Procedure: APPLICATION OF CRANIAL NAVIGATION;  Surgeon: Vallarie Mare, MD;  Location: [facility];  Service: Neurosurgery;  Laterality: N/A;   AUGMENTATION MAMMAPLASTY Bilateral 2009   BILIARY STENT PLACEMENT N/A 12/10/2017   Procedure: BILIARY STENT PLACEMENT;  Surgeon: Carol Ada, MD;  Location: [facility];  Service: Endoscopy;  Laterality: N/A;  plastic 8.5 x 5 stent placed   BREAST BIOPSY Left 2010   precancerous bx   CRANIOTOMY Left 10/21/2020   Procedure: LEFT FRONTAL CRANIOTOMY FOR RESECTION OF MENINGIOMA;  Surgeon: Vallarie Mare, MD;  Location: [facility];  Service: Neurosurgery;  Laterality: Left;   CRYOTHERAPY     cervix-3x   ENDOMETRIAL  ABLATION  2010   ERCP N/A 12/10/2017   Procedure: ENDOSCOPIC RETROGRADE CHOLANGIOPANCREATOGRAPHY (ERCP);  Surgeon: Carol Ada, MD;  Location: [facility];  Service: Endoscopy;  Laterality: N/A;  Balloon sweep of duct   LAPAROSCOPIC CHOLECYSTECTOMY  2008   SPHINCTEROTOMY  12/10/2017   Procedure: SPHINCTEROTOMY;  Surgeon: Carol Ada, MD;  Location: [facility];  Service: Endoscopy;;   TUBAL LIGATION  1998    There were no vitals filed for this visit.   Subjective Assessment - 01/07/21 0937     Subjective Had a good vacation in [place]. Has been going without her patch. Only had 2 rough nights. Wants to keep working on her balance and dynamic movements without the patch. Still struggling with the depth perception.    Pertinent History hypothyroidism, hiatal hernia, spincter of Oddi dysfunciton, depression, and anxiety.    Limitations Walking    Patient Stated Goals wants to be back normal (if not better) - played tennis 3-5x a week and was doing crossfit, likes hiking    Currently in Pain? Yes    Pain Score 3     Pain Location --   above L eyebrow   Pain Orientation Left    Pain Descriptors / Indicators Burning    Pain Type Acute pain    Pain Onset Today    Pain Relieving  Factors cold, ice pack                [facility] PT Assessment - 01/07/21 0943       Assessment   Medical Diagnosis s/p frontal craniotomy for meningioma ressection    Referring Provider (PT) Vallarie Mare, MD    Onset Date/Surgical Date 10/21/20    Hand Dominance Right      Prior Function   Level of Independence Independent      Functional Gait  Assessment   Gait assessed  Yes    Gait Level Surface Walks 20 ft in less than 5.5 sec, no assistive devices, good speed, no evidence for imbalance, normal gait pattern, deviates no more than 6 in outside of the 12 in walkway width.   5.17   Change in Gait Speed Able to smoothly change walking speed without loss of balance or gait deviation. Deviate no more  than 6 in outside of the 12 in walkway width.    Gait with Horizontal Head Turns Performs head turns smoothly with no change in gait. Deviates no more than 6 in outside 12 in walkway width    Gait with Vertical Head Turns Performs head turns with no change in gait. Deviates no more than 6 in outside 12 in walkway width.    Gait and Pivot Turn Pivot turns safely within 3 sec and stops quickly with no loss of balance.    Step Over Obstacle Is able to step over 2 stacked shoe boxes taped together (9 in total height) without changing gait speed. No evidence of imbalance.    Gait with Narrow Base of Support Is able to ambulate for 10 steps heel to toe with no staggering.    Gait with Eyes Closed Walks 20 ft, uses assistive device, slower speed, mild gait deviations, deviates 6-10 in outside 12 in walkway width. Ambulates 20 ft in less than 9 sec but greater than 7 sec.   7.2   Ambulating Backwards Walks 20 ft, no assistive devices, good speed, no evidence for imbalance, normal gait   7.2   Steps Alternating feet, no rail.    Total Score 29    FGA comment: 29/30                     Conditions: 1: 3 trials WNL 2: 3 trials WNL 3: trial 1 below functional limits, 2 and 3 WNL 4: WNL 5: trial 2 below normal limits 6: WNL Composite score: 73 Sensory Analysis Som: WNL Vis: WNL Vest: below normal Pref: WNL Strategy analysis: equal use of hip/ankle with coNditions 1-4, more use of hip during 5-6 COG alignment: slightly R and posteriorly.        [facility] Adult PT Treatment/Exercise - 01/07/21 0001       Ambulation/Gait   Ambulation/Gait Yes    Ambulation/Gait Assistance 5: Supervision    Ambulation/Gait Assistance Details throughout session with no AD and no eye patch    Assistive device None    Gait Pattern Step-through pattern    Ambulation Surface Level;Indoor                    PT Education - 01/07/21 1101     Education Details progress towards goals, results of  SOT, areas to continue to work on in therapy.    Person(s) Educated Patient    Methods Explanation    Comprehension Verbalized understanding  PT Short Term Goals - 12/07/20 1637       PT SHORT TERM GOAL #1   Title Pt will be independent with initial HEP in order to build upon functional gains made in therapy. ALL STGS DUE 12/03/20.    Baseline met on 12/07/20    Time 4    Period Weeks    Status Achieved    Target Date 12/03/20      PT SHORT TERM GOAL #2   Title Pt will improve condition 4 of mCTSIB to at least 15 seconds in order to demo improved vestibular input for balance.    Baseline 6.3 seconds on 11/09/20; 30 seconds on 12/07/20    Time 4    Period Weeks    Status Achieved      PT SHORT TERM GOAL #3   Title Pt will undergo further testing of BERG with STG/LTG written as appropriate.    Baseline 54/56 on 11/09/20, pt wearing glasses for double vision, would benefit from future assessment without glasses    Time 4    Period Weeks    Status Achieved      PT SHORT TERM GOAL #4   Title Pt will ambulate at least 49' with LRAD with supervision over indoor level surfaces in order to demo improved household mobility.    Baseline met with no AD.    Time 4    Period Weeks    Status Achieved      PT SHORT TERM GOAL #5   Title Pt will improve gait speed to at least 2.2 ft/sec with RW vs. LRAD in order to demo improved community mobility.    Baseline previously 1.84 ft/sec with RW, 9.19 = 3.56 ft/sec with no AD and R eye covered.    Time 4    Period Weeks    Status Achieved            Revised STGs for re-cert:  PT Short Term Goals - 01/07/21 1148       PT SHORT TERM GOAL #1   Title Pt will ambulate at least 1,000' outdoors over unlevel grass surfaces with supervision and no eye patch in order to demo improved community mobility. ALL STGS DUE 01/28/21    Time 3    Period Weeks    Status New    Target Date 01/28/21      PT SHORT TERM GOAL #2   Title Pt  will be able to jog 200' with supervision with HR remained stable in order to demo improved mobility for return to tennis.    Time 3    Period Weeks    Status New                PT Long Term Goals - 01/07/21 0953       PT LONG TERM GOAL #1   Title BERG goal/higher level balance test goal to be written as appropriate. ALL LTGS DUE 12/31/20    Baseline FGA performed on 12/24/20 - scoring a 22/30 with medium fall risk, 29/30 on FGA on 01/07/21    Time 8    Period Weeks    Status Achieved      PT LONG TERM GOAL #2   Title Pt will improve gait speed to at least 3.9 ft/sec with no AD and no eye patch to demo improved community mobility.    Baseline 1.84 ft/sec with RW; 9.19 = 3.56 ft/sec R eye covered.    Time 8  Period Weeks    Status Revised      PT LONG TERM GOAL #3   Title Pt will ambulate at least 500' outdoors over paved/unlevel surfaces with supervision and LRAD/no AD in order to demo improved community mobility.    Baseline not yet assessed.    Time 8    Period Weeks    Status Deferred      PT LONG TERM GOAL #4   Title Vestibular/mCTSIB goal to be written as appropriate.    Baseline per STG, pt has performed 30 seconds of condition 4 of mCTSIB on 12/07/20    Time 8    Period Weeks    Status Achieved      PT LONG TERM GOAL #5   Title Pt will perform 16 steps using single handrail and step through pattern with supervision in order to be able to go up to the 2nd floor of her house.    Baseline pt able to perform step through pattern, does need to use step to pattern when descending at home due to visual deficits.    Time 8    Period Weeks    Status Achieved             Revised LTGs.    PT Long Term Goals - 01/07/21 1151       PT LONG TERM GOAL #1   Title Pt will improve composite score of SOT to at least 80 and vestibular sensory analysis to [facility] in order to demo improved balance. ALL LTGS DUE 02/18/21    Time 6    Period Weeks    Status New    Target Date  02/18/21      PT LONG TERM GOAL #2   Title Pt will improve gait speed to at least 4.2 ft/sec with no AD and no eye patch to demo improved community mobility.    Baseline 1.84 ft/sec with RW; 9.19 = 3.56 ft/sec R eye covered.    Time 6    Period Weeks    Status On-going      PT LONG TERM GOAL #3   Title Pt will be able to jog 400' with supervision with HR remained stable over unlevel surfaces in order to demo improved mobility for return to tennis.    Time 6    Period Weeks    Status New      PT LONG TERM GOAL #4   Title Pt will be independent with final HEP in order to build upon functional gains made in therapy.    Time 6    Period Weeks    Status New                  Plan - 01/07/21 1135     Clinical Impression Statement Pt returns today from vacation in [place]. Pt ambulating into clinic with no eye patch and able to tolerate well (previously still needing one eye covered). Pt still with visual deficits, esp with busy patterns/backgrounds (pt to see vision therapist in a couple weeks). Performed FGA today with no eye patch and pt scored a 29/30 (previously was 21/30 with R eye covered) with significant improvement. Performed the SOT today for further balance assessment with pt below normal limits for vestibular for sensory analysis, but above normal for her composite score. Pt would like to return to playing tennis and higher level balance. Will re-cert for an additional 2x week for 6 weeks to continue to work on high level dynamic  balance, balance for incr vestibular input and gait without eye patch to improve functional mobility and for pt to return to PLOF prior to resecetion of meningioma.    Personal Factors and Comorbidities Comorbidity 3+;Past/Current Experience    Comorbidities hypothyroidism, hiatal hernia, spincter of Oddi dysfunciton, depression, and anxiety.    Examination-Activity Limitations Stairs;Transfers;Squat;Locomotion Level    Examination-Participation  Restrictions Community Activity;Driving;Cleaning;Laundry   playing tennis   Stability/Clinical Decision Making Evolving/Moderate complexity    Rehab Potential Good    PT Frequency 2x / week    PT Duration 6 weeks    PT Treatment/Interventions ADLs/Self Care Home Management;Stair training;Gait training;DME Instruction;Functional mobility training;Therapeutic activities;Therapeutic exercise;Neuromuscular re-education;Balance training;Patient/family education;Vestibular;Visual/perceptual remediation/compensation    PT Next Visit Plan balance with incr vestibular input, high level dynamic balance with visual tracking/dual tasking, gait outdoors with no eye patch. dynamic gait with no AD. progress gaze stablization with no eyes occluded.    PT Home Exercise Plan [id number]    Consulted and Agree with Plan of Care Patient             Patient will benefit from skilled therapeutic intervention in order to improve the following deficits and impairments:  Abnormal gait, Decreased activity tolerance, Decreased balance, Dizziness, Decreased strength, Impaired vision/preception, Decreased endurance  Visit Diagnosis: Other abnormalities of gait and mobility  Dizziness and giddiness  Unsteadiness on feet  Other symptoms and signs involving the nervous system     Problem List Patient Active Problem List   Diagnosis Date Noted   Brain mass 10/21/2020   S/P resection of meningioma 10/21/2020   Chronic headaches 10/14/2020   Frontal mass of brain 10/13/2020   Elevated LFTs    Dilated cbd, acquired    Abdominal pain 12/06/2017   Hypothyroidism 12/06/2017   Night sweats 03/11/2015   S/P endometrial ablation 03/12/2013   Premenstrual syndrome 03/12/2013   Anemia 08/14/2012   Pelvic pain in female 08/13/2012   Menorrhagia     Arliss Journey, PT, DPT  01/07/2021, 11:46 AM  [facility] [address] [place], [place],  [address] Phone: [phone]   Fax:  [phone]  Name: Lydia Jenkins MRN: [id number] Date of Birth: 1970-08-26

## 2021-01-07 NOTE — Therapy (Signed)
[facility] [address] [place], [place], [address] Phone: [phone]   Fax:  [phone]  Occupational Therapy Treatment  Patient Details  Name: Lydia Jenkins MRN: [id number] Date of Birth: 1970/06/22 Referring Provider (OT): Duffy Rhody, MD   Encounter Date: 01/07/2021   OT End of Session - 01/07/21 0854     Visit Number 14    Number of Visits 24   + 12 visits at renewal 12/21/20   Date for OT Re-Evaluation 02/01/21    Authorization Type UHC    Authorization Time Period [id number] No Auth    OT Start Time 0847    OT Stop Time 0930    OT Time Calculation (min) 43 min    Activity Tolerance Patient tolerated treatment well    Behavior During Therapy WFL for tasks assessed/performed             Past Medical History:  Diagnosis Date   Abnormal Pap smear    Anemia    iron deficient   Anxiety    Breast disorder    precencerous lesion, marker placed   Complication of anesthesia    Depression    Family history of adverse reaction to anesthesia    Hiatal hernia    Hypothyroidism    Liver dysfunction    Menorrhagia    Migraine headache with aura    PONV (postoperative nausea and vomiting)     Past Surgical History:  Procedure Laterality Date   ABDOMINOPLASTY  [id number]   APPLICATION OF CRANIAL NAVIGATION N/A 10/21/2020   Procedure: APPLICATION OF CRANIAL NAVIGATION;  Surgeon: Vallarie Mare, MD;  Location: [facility];  Service: Neurosurgery;  Laterality: N/A;   AUGMENTATION MAMMAPLASTY Bilateral 2009   BILIARY STENT PLACEMENT N/A 12/10/2017   Procedure: BILIARY STENT PLACEMENT;  Surgeon: Carol Ada, MD;  [facility];  Service: Endoscopy;  Laterality: N/A;  plastic 8.5 x 5 stent placed   BREAST BIOPSY Left 2010   precancerous bx   CRANIOTOMY Left 10/21/2020   Procedure: LEFT FRONTAL CRANIOTOMY FOR RESECTION OF MENINGIOMA;  Surgeon: Vallarie Mare, MD;  Location: [facility];  Service: Neurosurgery;   Laterality: Left;   CRYOTHERAPY     cervix-3x   ENDOMETRIAL ABLATION  2010   ERCP N/A 12/10/2017   Procedure: ENDOSCOPIC RETROGRADE CHOLANGIOPANCREATOGRAPHY (ERCP);  Surgeon: Carol Ada, MD;  Location: [facility];  Service: Endoscopy;  Laterality: N/A;  Balloon sweep of duct   LAPAROSCOPIC CHOLECYSTECTOMY  2008   SPHINCTEROTOMY  12/10/2017   Procedure: SPHINCTEROTOMY;  Surgeon: Carol Ada, MD;  Location[facility];  Service: Endoscopy;;   TUBAL LIGATION  1998    There were no vitals filed for this visit.   Subjective Assessment - 01/07/21 0849     Subjective  Pt arrived with no occlusion/patch. Pt reports trying to not wear it and spent all day not wearing it yesterday.    Pertinent History sphincter of oddi dysfunction, hiatal hernia, hypothyroidism, depression, anxiety    Limitations no driving    Patient Stated Goals "get back to what I was before if not better"    Currently in Pain? No/denies                          OT Treatments/Exercises (OP) - 01/07/21 0900       Visual/Perceptual Exercises   Copy this Image Other    Pegboard Tanagrams with vertical head and eye movements without occlusion - patient  reported some "fuzziness" but no diplopia with head and eye movements. Pt solved puzzles with min difficulty.    Scanning Environmental    Scanning - Environmental 15/15 - 100% accuracy. Pt did without occlusion and did not report any diplopia - some blurriness at first with focusing but no double vision.    Other Exercises pt is tolerating majority of the time without occlusion and reports it is going well with minimal disturbance                      OT Short Term Goals - 12/21/20 1032       OT SHORT TERM GOAL #1   Title Pt will be independent with diplopia HEP    Time 4    Period Weeks    Status Achieved    Target Date 12/03/20      OT SHORT TERM GOAL #2   Title Pt will perform environmental scanning with 90% Accuracy and no  reports of diplopia with occlusion PRN.    Time 4    Period Weeks    Status Achieved      OT SHORT TERM GOAL #3   Title Pt will be independent with occlusion schedule and recommendations per OT PRN    Time 4    Period Weeks    Status Achieved      OT SHORT TERM GOAL #4   Title Pt will perform simple warm meal prep and/or light housekeeping with no reports of diplopia with occlusion PRN and with mod I.    Time 4    Period Weeks    Status Achieved               OT Long Term Goals - 12/21/20 1033       OT LONG TERM GOAL #1   Title Pt will be independent with updated HEPs    Time 8    Period Weeks    Status On-going      OT LONG TERM GOAL #2   Title Pt will perform environmental scanning with cognitive component with 95% accuracy or greater and with no reports of diplopia with occlusion only PRN.    Time 8    Period Weeks    Status On-going   100% standard environmental scanning without cog component with no occlusion. distance vision/gaze with diplopia present but able to focus on cards with single vision.     OT LONG TERM GOAL #3   Title Pt will perform mod complex meal prep (i.e. following instructions on side of rice) with mod I and no reports of diplopia with occlusion PRN    Time 8    Period Weeks    Status Achieved   completing this at home per pt report.     OT LONG TERM GOAL #4   Title Pt will tolerate visual activity with head turns and/or eye movements with no occlusion for 5 minutes or greater with no reports of disturbance/dizziness.    Time 8    Period Weeks    Status Revised                   Plan - 01/07/21 0930     Clinical Impression Statement Pt is tolerating most of the day without occlusion with no reports of diplopia just some brief blurriness with focusing.    OT Occupational Profile and History Problem Focused Assessment - Including review of records relating to presenting problem    Occupational  performance deficits (Please refer  to evaluation for details): ADL's;IADL's    Body Structure / Function / Physical Skills Vision;Mobility;GMC;Strength;IADL;ADL;Decreased knowledge of use of DME    Clinical Decision Making Limited treatment options, no task modification necessary    Comorbidities Affecting Occupational Performance: None    Modification or Assistance to Complete Evaluation  No modification of tasks or assist necessary to complete eval    OT Frequency 2x / week    OT Duration 6 weeks   2x/week for 6 weeks at renewal 12/21/20   OT Treatment/Interventions Self-care/ADL training;DME and/or AE instruction;Visual/perceptual remediation/compensation;Patient/family education;Therapist, nutritional;Therapeutic exercise;Neuromuscular education;Therapeutic activities    Plan continue visual activities with head and eye turns without occlusion for increased tolerance    Consulted and Agree with Plan of Care Patient             Patient will benefit from skilled therapeutic intervention in order to improve the following deficits and impairments:   Body Structure / Function / Physical Skills: Vision, Mobility, GMC, Strength, IADL, ADL, Decreased knowledge of use of DME       Visit Diagnosis: Other abnormalities of gait and mobility  Dizziness and giddiness  Other symptoms and signs involving the nervous system  Unsteadiness on feet  Attention and concentration deficit  Visuospatial deficit    Problem List Patient Active Problem List   Diagnosis Date Noted   Brain mass 10/21/2020   S/P resection of meningioma 10/21/2020   Chronic headaches 10/14/2020   Frontal mass of brain 10/13/2020   Elevated LFTs    Dilated cbd, acquired    Abdominal pain 12/06/2017   Hypothyroidism 12/06/2017   Night sweats 03/11/2015   S/P endometrial ablation 03/12/2013   Premenstrual syndrome 03/12/2013   Anemia 08/14/2012   Pelvic pain in female 08/13/2012   Menorrhagia     Zachery Conch MOT, OTR/L   01/07/2021, 9:31 AM  [facility] [address] [place], [place], [address] Phone: [phone]   Fax:  [phone]  Name: Lydia Jenkins MRN: [id number] Date of Birth: 1970-10-08

## 2021-01-10 ENCOUNTER — Encounter: Payer: Self-pay | Admitting: Occupational Therapy

## 2021-01-10 ENCOUNTER — Ambulatory Visit: Payer: 59

## 2021-01-10 ENCOUNTER — Encounter: Payer: Self-pay | Admitting: Physical Therapy

## 2021-01-10 ENCOUNTER — Ambulatory Visit: Payer: 59 | Admitting: Occupational Therapy

## 2021-01-10 ENCOUNTER — Ambulatory Visit: Payer: 59 | Admitting: Physical Therapy

## 2021-01-10 ENCOUNTER — Other Ambulatory Visit: Payer: Self-pay

## 2021-01-10 DIAGNOSIS — R42 Dizziness and giddiness: Secondary | ICD-10-CM

## 2021-01-10 DIAGNOSIS — R2689 Other abnormalities of gait and mobility: Secondary | ICD-10-CM

## 2021-01-10 DIAGNOSIS — R41841 Cognitive communication deficit: Secondary | ICD-10-CM

## 2021-01-10 DIAGNOSIS — R41842 Visuospatial deficit: Secondary | ICD-10-CM

## 2021-01-10 DIAGNOSIS — R29818 Other symptoms and signs involving the nervous system: Secondary | ICD-10-CM

## 2021-01-10 DIAGNOSIS — R2681 Unsteadiness on feet: Secondary | ICD-10-CM

## 2021-01-10 DIAGNOSIS — R41840 Attention and concentration deficit: Secondary | ICD-10-CM

## 2021-01-10 NOTE — Therapy (Signed)
[facility] [address] [place], [place], [address] Phone: [phone]   Fax:  [phone]  Occupational Therapy Treatment  Patient Details  Name: JOELEE Jenkins MRN: [id number] Date of Birth: 12/08/1970 Referring Provider (OT): Duffy Rhody, MD   Encounter Date: 01/10/2021   OT End of Session - 01/10/21 1015     Visit Number 15    Number of Visits 24   + 12 visits at renewal 12/21/20   Date for OT Re-Evaluation 02/01/21    Authorization Type UHC    Authorization Time Period VL:MN No Auth    OT Start Time 1014    OT Stop Time 1100    OT Time Calculation (min) 46 min    Activity Tolerance Patient tolerated treatment well    Behavior During Therapy WFL for tasks assessed/performed             Past Medical History:  Diagnosis Date   Abnormal Pap smear    Anemia    iron deficient   Anxiety    Breast disorder    precencerous lesion, marker placed   Complication of anesthesia    Depression    Family history of adverse reaction to anesthesia    Hiatal hernia    Hypothyroidism    Liver dysfunction    Menorrhagia    Migraine headache with aura    PONV (postoperative nausea and vomiting)     Past Surgical History:  Procedure Laterality Date   ABDOMINOPLASTY  [id number]   APPLICATION OF CRANIAL NAVIGATION N/A 10/21/2020   Procedure: APPLICATION OF CRANIAL NAVIGATION;  Surgeon: Vallarie Mare, MD;  Location: [facility];  Service: Neurosurgery;  Laterality: N/A;   AUGMENTATION MAMMAPLASTY Bilateral 2009   BILIARY STENT PLACEMENT N/A 12/10/2017   Procedure: BILIARY STENT PLACEMENT;  Surgeon: Carol Ada, MD;  Location: [facility];  Service: Endoscopy;  Laterality: N/A;  plastic 8.5 x 5 stent placed   BREAST BIOPSY Left 2010   precancerous bx   CRANIOTOMY Left 10/21/2020   Procedure: LEFT FRONTAL CRANIOTOMY FOR RESECTION OF MENINGIOMA;  Surgeon: Vallarie Mare, MD;  Location: [facility];  Service: Neurosurgery;   Laterality: Left;   CRYOTHERAPY     cervix-3x   ENDOMETRIAL ABLATION  2010   ERCP N/A 12/10/2017   Procedure: ENDOSCOPIC RETROGRADE CHOLANGIOPANCREATOGRAPHY (ERCP);  Surgeon: Carol Ada, MD;  Location: [facility];  Service: Endoscopy;  Laterality: N/A;  Balloon sweep of duct   LAPAROSCOPIC CHOLECYSTECTOMY  2008   SPHINCTEROTOMY  12/10/2017   Procedure: SPHINCTEROTOMY;  Surgeon: Carol Ada, MD;  Location: [facility];  Service: Endoscopy;;   TUBAL LIGATION  1998    There were no vitals filed for this visit.   Subjective Assessment - 01/10/21 1014     Subjective  "Had a restful/relaxing weekend but didn't sleep well"    Pertinent History sphincter of oddi dysfunction, hiatal hernia, hypothyroidism, depression, anxiety    Limitations no driving    Patient Stated Goals "get back to what I was before if not better"    Currently in Pain? No/denies   pt reporting some pressure in L eye                         OT Treatments/Exercises (OP) - 01/10/21 1021       Visual/Perceptual Exercises   Copy this Image PVC    Pegboard working on head turns and eye turns with following pattern without occlusion. Fig 13 +  16 with no difficulty and appropriate time. no reports or disturbance with vision and diplopia    Scanning Tabletop;Other    Scanning - Tabletop Alternating Symbols Level 7 on Constant Therapy with 98% accuracy and 50.23s response time.    Scanning - Other bouncing small bounce ball on wall x 10 reps and then bouncing ball on racket for increasing eye movements and focus with moving object. pt with minimal reports of disturbance just with eyes focusing                      OT Short Term Goals - 12/21/20 1032       OT SHORT TERM GOAL #1   Title Pt will be independent with diplopia HEP    Time 4    Period Weeks    Status Achieved    Target Date 12/03/20      OT SHORT TERM GOAL #2   Title Pt will perform environmental scanning with 90% Accuracy  and no reports of diplopia with occlusion PRN.    Time 4    Period Weeks    Status Achieved      OT SHORT TERM GOAL #3   Title Pt will be independent with occlusion schedule and recommendations per OT PRN    Time 4    Period Weeks    Status Achieved      OT SHORT TERM GOAL #4   Title Pt will perform simple warm meal prep and/or light housekeeping with no reports of diplopia with occlusion PRN and with mod I.    Time 4    Period Weeks    Status Achieved               OT Long Term Goals - 01/10/21 1053       OT LONG TERM GOAL #1   Title Pt will be independent with updated HEPs    Time 8    Period Weeks    Status On-going      OT LONG TERM GOAL #2   Title Pt will perform environmental scanning with cognitive component with 95% accuracy or greater and with no reports of diplopia with occlusion only PRN.    Time 8    Period Weeks    Status Achieved   100% with cog component on 01/10/21 - no diplopia and no occlusion     OT LONG TERM GOAL #3   Title Pt will perform mod complex meal prep (i.e. following instructions on side of rice) with mod I and no reports of diplopia with occlusion PRN    Time 8    Period Weeks    Status Achieved   completing this at home per pt report.     OT LONG TERM GOAL #4   Title Pt will tolerate visual activity with head turns and/or eye movements with no occlusion for 5 minutes or greater with no reports of disturbance/dizziness.    Time 8    Period Weeks    Status Revised                   Plan - 01/10/21 1148     Clinical Impression Statement Pt has made significant progress with tolerating no occlusion and no reports of diplopia. Pt with some disturbance with initial focus but doing well with every day activities.    OT Occupational Profile and History Problem Focused Assessment - Including review of records relating to presenting problem    Occupational  performance deficits (Please refer to evaluation for details): ADL's;IADL's     Body Structure / Function / Physical Skills Vision;Mobility;GMC;Strength;IADL;ADL;Decreased knowledge of use of DME    Clinical Decision Making Limited treatment options, no task modification necessary    Comorbidities Affecting Occupational Performance: None    Modification or Assistance to Complete Evaluation  No modification of tasks or assist necessary to complete eval    OT Frequency 2x / week    OT Duration 6 weeks   2x/week for 6 weeks at renewal 12/21/20   OT Treatment/Interventions Self-care/ADL training;DME and/or AE instruction;Visual/perceptual remediation/compensation;Patient/family education;Therapist, nutritional;Therapeutic exercise;Neuromuscular education;Therapeutic activities    Plan continue visual activities with head and eye turns without occlusion for increased tolerance    Consulted and Agree with Plan of Care Patient             Patient will benefit from skilled therapeutic intervention in order to improve the following deficits and impairments:   Body Structure / Function / Physical Skills: Vision, Mobility, GMC, Strength, IADL, ADL, Decreased knowledge of use of DME       Visit Diagnosis: Other abnormalities of gait and mobility  Other symptoms and signs involving the nervous system  Unsteadiness on feet  Dizziness and giddiness  Attention and concentration deficit  Visuospatial deficit    Problem List Patient Active Problem List   Diagnosis Date Noted   Brain mass 10/21/2020   S/P resection of meningioma 10/21/2020   Chronic headaches 10/14/2020   Frontal mass of brain 10/13/2020   Elevated LFTs    Dilated cbd, acquired    Abdominal pain 12/06/2017   Hypothyroidism 12/06/2017   Night sweats 03/11/2015   S/P endometrial ablation 03/12/2013   Premenstrual syndrome 03/12/2013   Anemia 08/14/2012   Pelvic pain in female 08/13/2012   Menorrhagia     Zachery Conch MOT, OTR/L  01/10/2021, 12:05 PM  [facility] [address] [place], [place], [address] Phone: [phone]   Fax:  [phone]  Name: Lydia Jenkins MRN: [id number] Date of Birth: [birth year]

## 2021-01-10 NOTE — Therapy (Addendum)
[facility] [address] [place], [place], [address] Phone: [phone]   Fax:  [phone]  Physical Therapy Treatment  Patient Details  Name: Lydia Jenkins MRN: [id number] Date of Birth: May 14, 1971 Referring Provider (PT): Vallarie Mare, MD   Encounter Date: 01/10/2021   PT End of Session - 01/10/21 1027     Visit Number 15    Number of Visits 26    Date for PT Re-Evaluation 03/08/21    Authorization Type UHC 2022    PT Start Time 0932    PT Stop Time [id number]    PT Time Calculation (min) 42 min    Activity Tolerance Patient tolerated treatment well    Behavior During Therapy [facility] for tasks assessed/performed             Past Medical History:  Diagnosis Date   Abnormal Pap smear    Anemia    iron deficient   Anxiety    Breast disorder    precencerous lesion, marker placed   Complication of anesthesia    Depression    Family history of adverse reaction to anesthesia    Hiatal hernia    Hypothyroidism    Liver dysfunction    Menorrhagia    Migraine headache with aura    PONV (postoperative nausea and vomiting)     Past Surgical History:  Procedure Laterality Date   ABDOMINOPLASTY  [id number]   APPLICATION OF CRANIAL NAVIGATION N/A 10/21/2020   Procedure: APPLICATION OF CRANIAL NAVIGATION;  Surgeon: Vallarie Mare, MD;  Location: [facility];  Service: Neurosurgery;  Laterality: N/A;   AUGMENTATION MAMMAPLASTY Bilateral 2009   BILIARY STENT PLACEMENT N/A 12/10/2017   Procedure: BILIARY STENT PLACEMENT;  Surgeon: Carol Ada, MD;  Location: [facility];  Service: Endoscopy;  Laterality: N/A;  plastic 8.5 x 5 stent placed   BREAST BIOPSY Left 2010   precancerous bx   CRANIOTOMY Left 10/21/2020   Procedure: LEFT FRONTAL CRANIOTOMY FOR RESECTION OF MENINGIOMA;  Surgeon: Vallarie Mare, MD;  Location: [facility];  Service: Neurosurgery;  Laterality: Left;   CRYOTHERAPY     cervix-3x   ENDOMETRIAL ABLATION   2010   ERCP N/A 12/10/2017   Procedure: ENDOSCOPIC RETROGRADE CHOLANGIOPANCREATOGRAPHY (ERCP);  Surgeon: Carol Ada, MD;  Location: [facility];  Service: Endoscopy;  Laterality: N/A;  Balloon sweep of duct   LAPAROSCOPIC CHOLECYSTECTOMY  2008   SPHINCTEROTOMY  12/10/2017   Procedure: SPHINCTEROTOMY;  Surgeon: Carol Ada, MD;  Location: [facility];  Service: Endoscopy;;   TUBAL LIGATION  1998    There were no vitals filed for this visit.   Subjective Assessment - 01/10/21 0935     Subjective Has had the patch off the whole weekend. Reports feeling a bit of pressure behind her eyes today, has not been sleeping well. Took tylenol before coming.    Pertinent History hypothyroidism, hiatal hernia, spincter of Oddi dysfunciton, depression, and anxiety.    Limitations Walking    Patient Stated Goals wants to be back normal (if not better) - played tennis 3-5x a week and was doing crossfit, likes hiking    Currently in Pain? No/denies    Pain Onset Today                               [facility] Adult PT Treatment/Exercise - 01/10/21 0940       Ambulation/Gait   Ambulation/Gait Yes    Ambulation/Gait  Assistance 5: Supervision    Ambulation/Gait Assistance Details outdoors on grass with scanning environment, no issues with balance/vision, able to maintain gait speed throughout    Ambulation Distance (Feet) 500 Feet    Assistive device None    Gait Pattern Step-through pattern    Ambulation Surface Level;Indoor;Unlevel;Outdoor;Grass    Gait Comments gait around track with ball toss up and down with tracking with head and eyes, and performing rotations and tosssing side to side with therapist x10 reps      High Level Balance   High Level Balance Comments SLS: 20 seconds each leg (could have held longer), incr diffculty with LLE. working with rebounder; tandem stance B and tossing ball and catching it in different directions x8 reps each side then SLS x5 reps each ball.  playing badminton to simulate tennis and hand/eye coordination and visual tracking - passing birdie with raquet between therapist. tree pose BLE 2 x 20 seconds B, then attempting with eyes closed with pt able to hold for a few seconds before needing UE support                Balance Exercises - 01/10/21 1007       Balance Exercises: Standing   Standing Eyes Closed Foam/compliant surface    Standing Eyes Closed Limitations on rockerboard: eyes closed; x30 seconds, 2 x 10 reps head turns, 2 x 10 reps head nods, mini squats eyes open x5 reps, eyes closed 2 x 5 reps - intermittent taps to bars as needed for balance                 PT Short Term Goals - 01/07/21 1148       PT SHORT TERM GOAL #1   Title Pt will ambulate at least 1,000' outdoors over unlevel grass surfaces with supervision and no eye patch in order to demo improved community mobility. ALL STGS DUE 01/28/21    Time 3    Period Weeks    Status New    Target Date 01/28/21      PT SHORT TERM GOAL #2   Title Pt will be able to jog 200' with supervision with HR remained stable in order to demo improved mobility for return to tennis.    Time 3    Period Weeks    Status New               PT Long Term Goals - 01/07/21 1151       PT LONG TERM GOAL #1   Title Pt will improve composite score of SOT to at least 80 and vestibular sensory analysis to [facility] in order to demo improved balance. ALL LTGS DUE 02/18/21    Time 6    Period Weeks    Status New    Target Date 02/18/21      PT LONG TERM GOAL #2   Title Pt will improve gait speed to at least 4.2 ft/sec with no AD and no eye patch to demo improved community mobility.    Baseline 1.84 ft/sec with RW; 9.19 = 3.56 ft/sec R eye covered.    Time 6    Period Weeks    Status On-going      PT LONG TERM GOAL #3   Title Pt will be able to jog 400' with supervision with HR remained stable over unlevel surfaces in order to demo improved mobility for return to tennis.     Time 6    Period Weeks  Status New      PT LONG TERM GOAL #4   Title Pt will be independent with final HEP in order to build upon functional gains made in therapy.    Time 6    Period Weeks    Status New                   Plan - 01/10/21 1213     Clinical Impression Statement Today's skilled session focused on dynamic gait and balance tasks with visual tracking, narrow BOS balance, and balance with vision removed. Trialed badminton today to simulate tennis with pt tolerating well - pt to bring in her tennis raquet to future sessions. Pt needing intermittent UE support with SLS activities and with eyes closed.    Personal Factors and Comorbidities Comorbidity 3+;Past/Current Experience    Comorbidities hypothyroidism, hiatal hernia, spincter of Oddi dysfunciton, depression, and anxiety.    Examination-Activity Limitations Stairs;Transfers;Squat;Locomotion Level    Examination-Participation Restrictions Community Activity;Driving;Cleaning;Laundry   playing tennis   Stability/Clinical Decision Making Evolving/Moderate complexity    Rehab Potential Good    PT Frequency 2x / week    PT Duration 6 weeks    PT Treatment/Interventions ADLs/Self Care Home Management;Stair training;Gait training;DME Instruction;Functional mobility training;Therapeutic activities;Therapeutic exercise;Neuromuscular re-education;Balance training;Patient/family education;Vestibular;Visual/perceptual remediation/compensation    PT Next Visit Plan balance with incr vestibular input, high level dynamic balance with visual tracking/dual tasking. dynamic gait with no AD. progress gaze stablization with no eyes occluded.    PT Home Exercise Plan [id number]    Consulted and Agree with Plan of Care Patient             Patient will benefit from skilled therapeutic intervention in order to improve the following deficits and impairments:  Abnormal gait, Decreased activity tolerance, Decreased balance,  Dizziness, Decreased strength, Impaired vision/preception, Decreased endurance  Visit Diagnosis: Other abnormalities of gait and mobility  Other symptoms and signs involving the nervous system  Unsteadiness on feet     Problem List Patient Active Problem List   Diagnosis Date Noted   Brain mass 10/21/2020   S/P resection of meningioma 10/21/2020   Chronic headaches 10/14/2020   Frontal mass of brain 10/13/2020   Elevated LFTs    Dilated cbd, acquired    Abdominal pain 12/06/2017   Hypothyroidism 12/06/2017   Night sweats 03/11/2015   S/P endometrial ablation 03/12/2013   Premenstrual syndrome 03/12/2013   Anemia 08/14/2012   Pelvic pain in female 08/13/2012   Menorrhagia     Arliss Journey, PT, DPT  01/10/2021, 12:15 PM  [facility] [address] [place], [place], [address] Phone: [phone]   Fax:  [phone]  Name: FAATIMAH YOSS MRN: [id number] Date of Birth: August 29, 1970

## 2021-01-10 NOTE — Therapy (Signed)
[facility] [address] [place], [place], [address] Phone: [phone]   Fax:  [phone]  Speech Language Pathology Treatment  Patient Details  Name: Lydia Jenkins MRN: [id number] Date of Birth: Sep 23, 1970 Referring Provider (SLP): Vallarie Mare, MD   Encounter Date: 01/10/2021   End of Session - 01/10/21 1357     Visit Number 4    Number of Visits 17    Date for SLP Re-Evaluation 02/07/21    SLP Start Time 1101    SLP Stop Time  1145    SLP Time Calculation (min) 44 min    Activity Tolerance Patient tolerated treatment well             Past Medical History:  Diagnosis Date   Abnormal Pap smear    Anemia    iron deficient   Anxiety    Breast disorder    precencerous lesion, marker placed   Complication of anesthesia    Depression    Family history of adverse reaction to anesthesia    Hiatal hernia    Hypothyroidism    Liver dysfunction    Menorrhagia    Migraine headache with aura    PONV (postoperative nausea and vomiting)     Past Surgical History:  Procedure Laterality Date   ABDOMINOPLASTY  [id number]   APPLICATION OF CRANIAL NAVIGATION N/A 10/21/2020   Procedure: APPLICATION OF CRANIAL NAVIGATION;  Surgeon: Vallarie Mare, MD;  Location: [facility];  Service: Neurosurgery;  Laterality: N/A;   AUGMENTATION MAMMAPLASTY Bilateral 2009   BILIARY STENT PLACEMENT N/A 12/10/2017   Procedure: BILIARY STENT PLACEMENT;  Surgeon: Carol Ada, MD;  Location: [facility];  Service: Endoscopy;  Laterality: N/A;  plastic 8.5 x 5 stent placed   BREAST BIOPSY Left 2010   precancerous bx   CRANIOTOMY Left 10/21/2020   Procedure: LEFT FRONTAL CRANIOTOMY FOR RESECTION OF MENINGIOMA;  Surgeon: Vallarie Mare, MD;  Location: [facility];  Service: Neurosurgery;  Laterality: Left;   CRYOTHERAPY     cervix-3x   ENDOMETRIAL ABLATION  2010   ERCP N/A 12/10/2017   Procedure: ENDOSCOPIC RETROGRADE  CHOLANGIOPANCREATOGRAPHY (ERCP);  Surgeon: Carol Ada, MD;  Location: [facility];  Service: Endoscopy;  Laterality: N/A;  Balloon sweep of duct   LAPAROSCOPIC CHOLECYSTECTOMY  2008   SPHINCTEROTOMY  12/10/2017   Procedure: SPHINCTEROTOMY;  Surgeon: Carol Ada, MD;  Location: [facility];  Service: Endoscopy;;   TUBAL LIGATION  1998    There were no vitals filed for this visit.   Subjective Assessment - 01/10/21 1102     Subjective "I don't need my eye patch anymore"    Currently in Pain? No/denies   occasional head/eye pressures that last seconds                  ADULT SLP TREATMENT - 01/10/21 1102       General Information   Behavior/Cognition Alert;Pleasant mood;Cooperative      Treatment Provided   Treatment provided Cognitive-Linquistic      Cognitive-Linquistic Treatment   Treatment focused on Cognition;Patient/family/caregiver education    Skilled Treatment Pt reported improvements in cognitive functioning since last ST session, with less confusion in morning and overall more clear thinking. Pt endorsed successfully using trained external memory aids for medication managemnet per SLP recommendations. Pt requested SLP input and recommendations to manage family members questioning her recall abilities. SLP educated patient on using clarifying/confirming questions to ensure all listening partners recalled the same information. SLP  recommended patient use memory compensations of repetition and mental picture to aid recall/attention for remembering why she entered a room. SLP also recommended patient discuss current positive progress and allow family to provide feedback re: current level of cognitive functioning to aid awareness. Pt verbalized understanding and agreement with education. Handout provided with recommendations.      Assessment / Recommendations / Plan   Plan Continue with current plan of care      Progression Toward Goals   Progression toward goals  Progressing toward goals              SLP Education - 01/10/21 1359     Education Details memory and attention compensations, asking family to heighten awareness of possible errors    Person(s) Educated Patient    Methods Explanation;Demonstration;Handout    Comprehension Verbalized understanding;Returned demonstration;Need further instruction              SLP Short Term Goals - 01/10/21 1400       SLP SHORT TERM GOAL #1   Title Pt will use memory compensations for appointments, medicine management, and other daily activities with occasional min A over 2 sessions    Baseline 12-23-20, 01-10-21    Status Achieved      SLP SHORT TERM GOAL #2   Title Pt will demonstrate awareness of errors on mod complex structured tasks with 80% accuracy given rare min A over 2 sessions    Time 3    Period Weeks    Status On-going      SLP SHORT TERM GOAL #3   Title Pt will recall 3 items on daily to-do list with use of memory strategy with rare min A over 2 sessions    Baseline 12-23-20    Time 3    Period Weeks    Status On-going      SLP SHORT TERM GOAL #4   Title Pt will independently orient self to day of week with use of memory strategy for 2/3 opportunities given rare min A    Baseline 12-23-20    Time 3    Period Weeks    Status On-going      SLP SHORT TERM GOAL #5   Title Pt will complete cognitive communication PROM next session    Time 3    Period Weeks    Status Revised              SLP Long Term Goals - 01/10/21 1401       SLP LONG TERM GOAL #1   Title Pt will use memory compensations for appointments, medicine management, and other daily activities with rare min A over 2 sessions    Baseline 12-23-20    Time 7    Period Weeks    Status On-going      SLP LONG TERM GOAL #2   Title Pt will demonstrate awareness of errors on mod complex structured tasks with 90% accuracy given rare min A over 2 sessions    Time 7    Period Weeks    Status On-going       SLP LONG TERM GOAL #3   Title Pt will recall 5 items on daily to-do list with use of memory strategies with rare min A over 2 sessions    Baseline 12-23-20    Time 7    Period Weeks    Status On-going      SLP LONG TERM GOAL #4   Title Pt will report improved cognitive  functioning by 2 points by last ST session    Time 7    Period Weeks    Status On-going              Plan - 01/10/21 1357     Clinical Impression Statement Sivi presents for OPST intervention secondary to meningiomia resection in May 2022. Pt reports improvements in cognition and success with trained memory compensations. SLP educated patient on use of clarifying/confirming questions to aid and monitor recall as well as use of repetition/mental picture strategies to aid recall and attention to tasks. Skilled ST is warranted to address mild cognitive communication changes to maximize functional independence and optimize functioning.    Speech Therapy Frequency 1x /week   decrease to 1x/week due to reported progress   Duration 8 weeks    Treatment/Interventions Compensatory strategies;Cueing hierarchy;Functional tasks;Patient/family education;Environmental controls;Cognitive reorganization;Compensatory techniques;Multimodal communcation approach;Language facilitation;Internal/external aids;SLP instruction and feedback    Potential to Achieve Goals Good    SLP Home Exercise Plan provided    Consulted and Agree with Plan of Care Patient             Patient will benefit from skilled therapeutic intervention in order to improve the following deficits and impairments:   Cognitive communication deficit    Problem List Patient Active Problem List   Diagnosis Date Noted   Brain mass 10/21/2020   S/P resection of meningioma 10/21/2020   Chronic headaches 10/14/2020   Frontal mass of brain 10/13/2020   Elevated LFTs    Dilated cbd, acquired    Abdominal pain 12/06/2017   Hypothyroidism 12/06/2017   Night sweats  03/11/2015   S/P endometrial ablation 03/12/2013   Premenstrual syndrome 03/12/2013   Anemia 08/14/2012   Pelvic pain in female 08/13/2012   Menorrhagia     Alinda Deem, MA CCC-SLP 01/10/2021, 2:11 PM  [facility] [address] [place], [place], [address] Phone: [phone]   Fax:  [phone]   Name: Lydia Jenkins MRN: [id number] Date of Birth: December 23, 1970

## 2021-01-10 NOTE — Patient Instructions (Signed)
Things to try:   -Use clarifying/confirming questions to confirm information with all members of the family.  -Let family members know how you feel you like you are doing.  Ask for family members to contribute any problems they might be seeing.   -Repeat to yourself/picture what you are going for in another room. This is a problem because multiple things have your attention at once so focus on one thing at a time.

## 2021-01-12 ENCOUNTER — Other Ambulatory Visit: Payer: Self-pay

## 2021-01-12 ENCOUNTER — Encounter: Payer: Self-pay | Admitting: Occupational Therapy

## 2021-01-12 ENCOUNTER — Ambulatory Visit: Payer: 59 | Admitting: Physical Therapy

## 2021-01-12 ENCOUNTER — Encounter: Payer: 59 | Admitting: Occupational Therapy

## 2021-01-12 ENCOUNTER — Ambulatory Visit: Payer: 59 | Admitting: Occupational Therapy

## 2021-01-12 ENCOUNTER — Ambulatory Visit: Payer: 59

## 2021-01-12 ENCOUNTER — Encounter: Payer: Self-pay | Admitting: Physical Therapy

## 2021-01-12 DIAGNOSIS — R29818 Other symptoms and signs involving the nervous system: Secondary | ICD-10-CM

## 2021-01-12 DIAGNOSIS — R2681 Unsteadiness on feet: Secondary | ICD-10-CM

## 2021-01-12 DIAGNOSIS — R41842 Visuospatial deficit: Secondary | ICD-10-CM

## 2021-01-12 DIAGNOSIS — R41840 Attention and concentration deficit: Secondary | ICD-10-CM

## 2021-01-12 DIAGNOSIS — R2689 Other abnormalities of gait and mobility: Secondary | ICD-10-CM

## 2021-01-12 NOTE — Therapy (Signed)
[facility] [address] [place], [place], [address] Phone: [phone]   Fax:  [phone]  Occupational Therapy Treatment and Discharge  Patient Details  Name: Lydia Jenkins MRN: [id number] Date of Birth: 06-Jul-1970 Referring Provider (OT): Duffy Rhody, MD   Encounter Date: 01/12/2021   OT End of Session - 01/12/21 1457     Visit Number 16    Number of Visits 24    Date for OT Re-Evaluation 02/01/21    Authorization Type [employer]    Authorization Time Period VL:MN No Auth    OT Start Time 1318    OT Stop Time 1400    OT Time Calculation (min) 42 min    Activity Tolerance Patient tolerated treatment well    Behavior During Therapy WFL for tasks assessed/performed             Past Medical History:  Diagnosis Date   Abnormal Pap smear    Anemia    iron deficient   Anxiety    Breast disorder    precencerous lesion, marker placed   Complication of anesthesia    Depression    Family history of adverse reaction to anesthesia    Hiatal hernia    Hypothyroidism    Liver dysfunction    Menorrhagia    Migraine headache with aura    PONV (postoperative nausea and vomiting)     Past Surgical History:  Procedure Laterality Date   ABDOMINOPLASTY  8502   APPLICATION OF CRANIAL NAVIGATION N/A 10/21/2020   Procedure: APPLICATION OF CRANIAL NAVIGATION;  Surgeon: Vallarie Mare, MD;  Location: [facility];  Service: Neurosurgery;  Laterality: N/A;   AUGMENTATION MAMMAPLASTY Bilateral 2009   BILIARY STENT PLACEMENT N/A 12/10/2017   Procedure: BILIARY STENT PLACEMENT;  Surgeon: Carol Ada, MD;  Location: [facility];  Service: Endoscopy;  Laterality: N/A;  plastic 8.5 x 5 stent placed   BREAST BIOPSY Left 2010   precancerous bx   CRANIOTOMY Left 10/21/2020   Procedure: LEFT FRONTAL CRANIOTOMY FOR RESECTION OF MENINGIOMA;  Surgeon: Vallarie Mare, MD;  Location: [facility];  Service: Neurosurgery;  Laterality: Left;    CRYOTHERAPY     cervix-3x   ENDOMETRIAL ABLATION  2010   ERCP N/A 12/10/2017   Procedure: ENDOSCOPIC RETROGRADE CHOLANGIOPANCREATOGRAPHY (ERCP);  Surgeon: Carol Ada, MD;  Location: [facility];  Service: Endoscopy;  Laterality: N/A;  Balloon sweep of duct   LAPAROSCOPIC CHOLECYSTECTOMY  2008   SPHINCTEROTOMY  12/10/2017   Procedure: SPHINCTEROTOMY;  Surgeon: Carol Ada, MD;  Location: [facility];  Service: Endoscopy;;   TUBAL LIGATION  1998    There were no vitals filed for this visit.   Subjective Assessment - 01/12/21 1328     Subjective  Patient reports diplopia resolving.  Patient not patching anymore!    Pertinent History sphincter of oddi dysfunction, hiatal hernia, hypothyroidism, depression, anxiety    Limitations no driving    Patient Stated Goals "get back to what I was before if not better"    Currently in Pain? No/denies    Pain Score 0-No pain                          OT Treatments/Exercises (OP) - 01/12/21 1442       ADLs   Driving Explained graduated driving program.  Feel patient could attempt to drive with another licensed driver in empty parking lot as a trial at this point.  Reviewed benefits  of checking side mirrors, and moving eyes from forward to lateral and back quickly.    ADL Comments Patient has met all OT goals and is agreeable to early discharge      Visual/Perceptual Exercises   Other Exercises Reviewed visual scanning through visual fields.  Patient reports very mild shadowing of object consistently in right inferior field.  Patient has mild dyscoordination of eye movement slowly upward - more pronounced in R eye.  May notice slight additional horizontal beat with end range of horizontal scanning.      Neurological Re-education Exercises   Other Exercises 1 Patient now walking without a device, and without any visual occlusion. Patient able to toss, catch. catch and clap, through large ranges of motion - inferior to superior.                     OT Education - 01/12/21 1457     Education Details graduated driving trial    Person(s) Educated Patient    Methods Explanation    Comprehension Verbalized understanding              OT Short Term Goals - 01/12/21 1459       OT SHORT TERM GOAL #1   Title Pt will be independent with diplopia HEP    Time 4    Period Weeks    Status Achieved    Target Date 12/03/20      OT SHORT TERM GOAL #2   Title Pt will perform environmental scanning with 90% Accuracy and no reports of diplopia with occlusion PRN.    Time 4    Period Weeks    Status Achieved      OT SHORT TERM GOAL #3   Title Pt will be independent with occlusion schedule and recommendations per OT PRN    Time 4    Period Weeks    Status Achieved      OT SHORT TERM GOAL #4   Title Pt will perform simple warm meal prep and/or light housekeeping with no reports of diplopia with occlusion PRN and with mod I.    Time 4    Period Weeks    Status Achieved               OT Long Term Goals - 01/12/21 1336       OT LONG TERM GOAL #1   Title Pt will be independent with updated HEPs    Time 8    Period Weeks    Status Achieved      OT LONG TERM GOAL #2   Title Pt will perform environmental scanning with cognitive component with 95% accuracy or greater and with no reports of diplopia with occlusion only PRN.    Time 8    Period Weeks    Status Achieved   100% with cog component on 01/10/21 - no diplopia and no occlusion     OT LONG TERM GOAL #3   Title Pt will perform mod complex meal prep (i.e. following instructions on side of rice) with mod I and no reports of diplopia with occlusion PRN    Time 8    Period Weeks    Status Achieved   completing this at home per pt report.     OT LONG TERM GOAL #4   Title Pt will tolerate visual activity with head turns and/or eye movements with no occlusion for 5 minutes or greater with no reports of disturbance/dizziness.  Time 8     Period Weeks    Status Achieved                   Plan - 01/12/21 1457     Clinical Impression Statement Patient has made significant improvement in visual coordination, balance, and cognition and has met / exceeded all  OT goals and is ready for discharge earlier than expected.    OT Occupational Profile and History Problem Focused Assessment - Including review of records relating to presenting problem    Occupational performance deficits (Please refer to evaluation for details): ADL's;IADL's    Body Structure / Function / Physical Skills Vision;Mobility;GMC;Strength;IADL;ADL;Decreased knowledge of use of DME    Rehab Potential Good    Clinical Decision Making Limited treatment options, no task modification necessary    Comorbidities Affecting Occupational Performance: None    Modification or Assistance to Complete Evaluation  No modification of tasks or assist necessary to complete eval    OT Frequency 2x / week    OT Duration 6 weeks    OT Treatment/Interventions Self-care/ADL training;DME and/or AE instruction;Visual/perceptual remediation/compensation;Patient/family education;Therapist, nutritional;Therapeutic exercise;Neuromuscular education;Therapeutic activities    Plan discharge    Consulted and Agree with Plan of Care Patient             Patient will benefit from skilled therapeutic intervention in order to improve the following deficits and impairments:   Body Structure / Function / Physical Skills: Vision, Mobility, GMC, Strength, IADL, ADL, Decreased knowledge of use of DME       Visit Diagnosis: Visuospatial deficit  Attention and concentration deficit  Unsteadiness on feet  Other symptoms and signs involving the nervous system    Problem List Patient Active Problem List   Diagnosis Date Noted   Brain mass 10/21/2020   S/P resection of meningioma 10/21/2020   Chronic headaches 10/14/2020   Frontal mass of brain 10/13/2020   Elevated LFTs     Dilated cbd, acquired    Abdominal pain 12/06/2017   Hypothyroidism 12/06/2017   Night sweats 03/11/2015   S/P endometrial ablation 03/12/2013   Premenstrual syndrome 03/12/2013   Anemia 08/14/2012   Pelvic pain in female 08/13/2012   Menorrhagia    OCCUPATIONAL THERAPY DISCHARGE SUMMARY  Visits from Start of Care: 16  Current functional level related to goals / functional outcomes: Independent with ADL/IADL     Remaining deficits: Mild visual disturbance, right lower quadrant - mild balance disturbance   Education / Equipment: HEP   Patient agrees to discharge. Patient goals were met. Patient is being discharged due to meeting the stated rehab goals.Mariah Milling, OTR/L 01/12/2021, 3:00 PM  [facility] [address] [place], [place], [address] Phone: [phone]   Fax:  [phone]  Name: FREDRICK DRAY MRN: [id number] Date of Birth: May 04, 1971

## 2021-01-12 NOTE — Therapy (Signed)
[facility] [address] [place], [place], [address] Phone: [phone]   Fax:  [phone]  Physical Therapy Treatment  Patient Details  Name: Lydia Jenkins MRN: [id number] Date of Birth: [birth year] Referring Provider (PT): Vallarie Mare, MD   Encounter Date: 01/12/2021   PT End of Session - 01/12/21 1153     Visit Number 16    Number of Visits 26    Date for PT Re-Evaluation 03/08/21    Authorization Type [employer] [id number]    PT Start Time 1103    PT Stop Time 1144    PT Time Calculation (min) 41 min    Activity Tolerance Patient tolerated treatment well    Behavior During Therapy [facility] for tasks assessed/performed             Past Medical History:  Diagnosis Date   Abnormal Pap smear    Anemia    iron deficient   Anxiety    Breast disorder    precencerous lesion, marker placed   Complication of anesthesia    Depression    Family history of adverse reaction to anesthesia    Hiatal hernia    Hypothyroidism    Liver dysfunction    Menorrhagia    Migraine headache with aura    PONV (postoperative nausea and vomiting)     Past Surgical History:  Procedure Laterality Date   ABDOMINOPLASTY  [id number]   APPLICATION OF CRANIAL NAVIGATION N/A 10/21/2020   Procedure: APPLICATION OF CRANIAL NAVIGATION;  Surgeon: Vallarie Mare, MD;  Location: [facility];  Service: Neurosurgery;  Laterality: N/A;   AUGMENTATION MAMMAPLASTY Bilateral 2009   BILIARY STENT PLACEMENT N/A 12/10/2017   Procedure: BILIARY STENT PLACEMENT;  Surgeon: Carol Ada, MD;  Location: [facility];  Service: Endoscopy;  Laterality: N/A;  plastic 8.5 x 5 stent placed   BREAST BIOPSY Left 2010   precancerous bx   CRANIOTOMY Left 10/21/2020   Procedure: LEFT FRONTAL CRANIOTOMY FOR RESECTION OF MENINGIOMA;  Surgeon: Vallarie Mare, MD;  Location: [facility];  Service: Neurosurgery;  Laterality: Left;   CRYOTHERAPY     cervix-3x   ENDOMETRIAL ABLATION   2010   ERCP N/A 12/10/2017   Procedure: ENDOSCOPIC RETROGRADE CHOLANGIOPANCREATOGRAPHY (ERCP);  Surgeon: Carol Ada, MD;  Location: [facility];  Service: Endoscopy;  Laterality: N/A;  Balloon sweep of duct   LAPAROSCOPIC CHOLECYSTECTOMY  2008   SPHINCTEROTOMY  12/10/2017   Procedure: SPHINCTEROTOMY;  Surgeon: Carol Ada, MD;  Location: [facility];  Service: Endoscopy;;   TUBAL LIGATION  1998    There were no vitals filed for this visit.   Subjective Assessment - 01/12/21 1105     Subjective Had a good day with her trainer this morning. Pressure is feeling better today. Doctor's recommendations is max HR 160 with activity.    Pertinent History hypothyroidism, hiatal hernia, spincter of Oddi dysfunciton, depression, and anxiety.    Limitations Walking    Patient Stated Goals wants to be back normal (if not better) - played tennis 3-5x a week and was doing crossfit, likes hiking    Currently in Pain? No/denies    Pain Onset Today                               [facility] Adult PT Treatment/Exercise - 01/12/21 1111       Exercises   Exercises Knee/Hip      Knee/Hip Exercises: Aerobic  Elliptical level 1.0 - 1 minute forwards, 1 minute backwards, HR after: 136 bpm             Vestibular Treatment/Exercise - 01/12/21 1114       Vestibular Treatment/Exercise   Vestibular Treatment Provided Gaze;Habituation      X1 Viewing Horizontal   Foot Position standing feet apart with no eyes occluded, then with feet on foam    Comments x60 seconds level ground, 2 x 60 seconds on air ex, cues for ROM to prevent double vision      X1 Viewing Vertical   Foot Position standing feet apart with no eyes occluded,  then with feet on foam    Comments x60 seconds level ground, 2 x 60 seconds on air ex                Balance Exercises - 01/12/21 1134       Balance Exercises: Standing   Standing Eyes Closed Wide (BOA)    Standing Eyes Closed Limitations on  black side of BOSU: x15 seconds, 2 x 20 seconds    Tandem Stance Eyes open;Foam/compliant surface;Limitations    Tandem Stance Time on blue air ex, x10 reps head turns, x10 reps each nods with each leg posteriorly, intermittent taps to walls/chair for balance    Other Standing Exercises on blue air ex in corner: modified tandem stance and tossing/bouncing tennis ball with PT and then adding cognitive challenge of naming foods in alphabet with every other letter, performed with both leg posteriorly    Other Standing Exercises Comments on black side of BOSU: weight shifting A/P, M/L and then clockwise and counterclockwise                 PT Short Term Goals - 01/07/21 1148       PT SHORT TERM GOAL #1   Title Pt will ambulate at least 1,000' outdoors over unlevel grass surfaces with supervision and no eye patch in order to demo improved community mobility. ALL STGS DUE 01/28/21    Time 3    Period Weeks    Status New    Target Date 01/28/21      PT SHORT TERM GOAL #2   Title Pt will be able to jog 200' with supervision with HR remained stable in order to demo improved mobility for return to tennis.    Time 3    Period Weeks    Status New               PT Long Term Goals - 01/07/21 1151       PT LONG TERM GOAL #1   Title Pt will improve composite score of SOT to at least 80 and vestibular sensory analysis to [facility] in order to demo improved balance. ALL LTGS DUE 02/18/21    Time 6    Period Weeks    Status New    Target Date 02/18/21      PT LONG TERM GOAL #2   Title Pt will improve gait speed to at least 4.2 ft/sec with no AD and no eye patch to demo improved community mobility.    Baseline 1.84 ft/sec with RW; 9.19 = 3.56 ft/sec R eye covered.    Time 6    Period Weeks    Status On-going      PT LONG TERM GOAL #3   Title Pt will be able to jog 400' with supervision with HR remained stable over unlevel surfaces in order to  demo improved mobility for return to tennis.     Time 6    Period Weeks    Status New      PT LONG TERM GOAL #4   Title Pt will be independent with final HEP in order to build upon functional gains made in therapy.    Time 6    Period Weeks    Status New                   Plan - 01/12/21 1200     Clinical Impression Statement Initiated elliptical today beginning at 2 minutes with HR at 132 bpm afterwards. Performed VOR x1 today on a compliant surface with no issues/dizziness. Will try on busy background at next session. Remainder of session continued to focus on balance on unlevel surfaces with weight shifting, dual tasking, vision removed.    Personal Factors and Comorbidities Comorbidity 3+;Past/Current Experience    Comorbidities hypothyroidism, hiatal hernia, spincter of Oddi dysfunciton, depression, and anxiety.    Examination-Activity Limitations Stairs;Transfers;Squat;Locomotion Level    Examination-Participation Restrictions Community Activity;Driving;Cleaning;Laundry   playing tennis   Stability/Clinical Decision Making Evolving/Moderate complexity    Rehab Potential Good    PT Frequency 2x / week    PT Duration 6 weeks    PT Treatment/Interventions ADLs/Self Care Home Management;Stair training;Gait training;DME Instruction;Functional mobility training;Therapeutic activities;Therapeutic exercise;Neuromuscular re-education;Balance training;Patient/family education;Vestibular;Visual/perceptual remediation/compensation    PT Next Visit Plan balance with incr vestibular input, high level dynamic balance with visual tracking/dual tasking for pt to return to tennis. progress elliptical. gaze stab VOR x1 with busy background.    PT Home Exercise Plan [id number]    Consulted and Agree with Plan of Care Patient             Patient will benefit from skilled therapeutic intervention in order to improve the following deficits and impairments:  Abnormal gait, Decreased activity tolerance, Decreased balance, Dizziness,  Decreased strength, Impaired vision/preception, Decreased endurance  Visit Diagnosis: Other abnormalities of gait and mobility  Other symptoms and signs involving the nervous system  Unsteadiness on feet     Problem List Patient Active Problem List   Diagnosis Date Noted   Brain mass 10/21/2020   S/P resection of meningioma 10/21/2020   Chronic headaches 10/14/2020   Frontal mass of brain 10/13/2020   Elevated LFTs    Dilated cbd, acquired    Abdominal pain 12/06/2017   Hypothyroidism 12/06/2017   Night sweats 03/11/2015   S/P endometrial ablation 03/12/2013   Premenstrual syndrome 03/12/2013   Anemia 08/14/2012   Pelvic pain in female 08/13/2012   Menorrhagia     Arliss Journey, PT, DPT  01/12/2021, 12:02 PM  [facility] [address] [place], [place], [address] Phone: [phone]   Fax:  [phone]  Name: Lydia Jenkins MRN: [id number] Date of Birth: 1971/04/30

## 2021-01-18 ENCOUNTER — Ambulatory Visit: Payer: 59 | Admitting: Physical Therapy

## 2021-01-18 ENCOUNTER — Other Ambulatory Visit: Payer: Self-pay

## 2021-01-18 ENCOUNTER — Encounter: Payer: 59 | Admitting: Occupational Therapy

## 2021-01-18 ENCOUNTER — Encounter: Payer: Self-pay | Admitting: Physical Therapy

## 2021-01-18 ENCOUNTER — Ambulatory Visit: Payer: 59

## 2021-01-18 DIAGNOSIS — R41841 Cognitive communication deficit: Secondary | ICD-10-CM

## 2021-01-18 DIAGNOSIS — R2681 Unsteadiness on feet: Secondary | ICD-10-CM

## 2021-01-18 DIAGNOSIS — R29818 Other symptoms and signs involving the nervous system: Secondary | ICD-10-CM

## 2021-01-18 DIAGNOSIS — R2689 Other abnormalities of gait and mobility: Secondary | ICD-10-CM

## 2021-01-18 NOTE — Therapy (Signed)
[facility] [address] [place], [place], [address] Phone: [phone]   Fax:  [phone]  Speech Language Pathology Treatment  Patient Details  Name: Lydia Jenkins MRN: [id number] Date of Birth: 22-Apr-1971 Referring Provider (SLP): Vallarie Mare, MD   Encounter Date: 01/18/2021   End of Session - 01/18/21 1224     Visit Number 5    Number of Visits 17    Date for SLP Re-Evaluation 02/07/21    SLP Start Time 1147    SLP Stop Time  1225    SLP Time Calculation (min) 38 min    Activity Tolerance Patient tolerated treatment well             Past Medical History:  Diagnosis Date   Abnormal Pap smear    Anemia    iron deficient   Anxiety    Breast disorder    precencerous lesion, marker placed   Complication of anesthesia    Depression    Family history of adverse reaction to anesthesia    Hiatal hernia    Hypothyroidism    Liver dysfunction    Menorrhagia    Migraine headache with aura    PONV (postoperative nausea and vomiting)     Past Surgical History:  Procedure Laterality Date   ABDOMINOPLASTY  [id number]   APPLICATION OF CRANIAL NAVIGATION N/A 10/21/2020   Procedure: APPLICATION OF CRANIAL NAVIGATION;  Surgeon: Vallarie Mare, MD;  Location: [facility];  Service: Neurosurgery;  Laterality: N/A;   AUGMENTATION MAMMAPLASTY Bilateral 2009   BILIARY STENT PLACEMENT N/A 12/10/2017   Procedure: BILIARY STENT PLACEMENT;  Surgeon: Carol Ada, MD;  Location: [facility];  Service: Endoscopy;  Laterality: N/A;  plastic 8.5 x 5 stent placed   BREAST BIOPSY Left 2010   precancerous bx   CRANIOTOMY Left 10/21/2020   Procedure: LEFT FRONTAL CRANIOTOMY FOR RESECTION OF MENINGIOMA;  Surgeon: Vallarie Mare, MD;  Location: [facility];  Service: Neurosurgery;  Laterality: Left;   CRYOTHERAPY     cervix-3x   ENDOMETRIAL ABLATION  2010   ERCP N/A 12/10/2017   Procedure: ENDOSCOPIC RETROGRADE  CHOLANGIOPANCREATOGRAPHY (ERCP);  Surgeon: Carol Ada, MD;  Location: [facility];  Service: Endoscopy;  Laterality: N/A;  Balloon sweep of duct   LAPAROSCOPIC CHOLECYSTECTOMY  2008   SPHINCTEROTOMY  12/10/2017   Procedure: SPHINCTEROTOMY;  Surgeon: Carol Ada, MD;  Location: [facility];  Service: Endoscopy;;   TUBAL LIGATION  1998    There were no vitals filed for this visit.   Subjective Assessment - 01/18/21 1406     Subjective "great"    Currently in Pain? No/denies                   ADULT SLP TREATMENT - 01/18/21 1157       General Information   Behavior/Cognition Alert;Pleasant mood;Cooperative      Treatment Provided   Treatment provided Cognitive-Linquistic      Cognitive-Linquistic Treatment   Treatment focused on Cognition;Patient/family/caregiver education    Skilled Treatment Pt reports significant success with using clarifying/confirming statements to aid recall and optimize communication with family members. Pt also indicated success using mental repetition to aid recall when searching for specific items. Pt requested one additional ST session to ensure maintenance of strategies.      Assessment / Recommendations / Plan   Plan Continue with current plan of care      Progression Toward Goals   Progression toward goals Progressing toward goals  SLP Education - 01/18/21 1405     Education Details compensations    Person(s) Educated Patient    Methods Explanation;Demonstration    Comprehension Verbalized understanding;Returned demonstration              SLP Short Term Goals - 01/18/21 1212       SLP SHORT TERM GOAL #1   Title Pt will use memory compensations for appointments, medicine management, and other daily activities with occasional min A over 2 sessions    Baseline 12-23-20, 01-10-21    Status Achieved      SLP SHORT TERM GOAL #2   Title Pt will demonstrate awareness of errors on mod complex structured tasks  with 80% accuracy given rare min A over 2 sessions    Time 3    Period Weeks    Status On-going      SLP SHORT TERM GOAL #3   Title Pt will recall 3 items on daily to-do list with use of memory strategy with rare min A over 2 sessions    Baseline 12-23-20, 01-18-21    Status Achieved      SLP SHORT TERM GOAL #4   Title Pt will independently orient self to day of week with use of memory strategy for 2/3 opportunities given rare min A    Baseline 12-23-20, 01-19-32    Status Achieved      SLP SHORT TERM GOAL #5   Title Pt will complete cognitive communication PROM next session    Time 2    Period Weeks    Status Revised              SLP Long Term Goals - 01/18/21 1216       SLP LONG TERM GOAL #1   Title Pt will use memory compensations for appointments, medicine management, and other daily activities with rare min A over 2 sessions    Baseline 12-23-20, 01-18-21    Time --    Period --    Status Achieved      SLP LONG TERM GOAL #2   Title Pt will demonstrate awareness of errors on mod complex structured tasks with 90% accuracy given rare min A over 2 sessions    Time 6    Period Weeks    Status On-going      SLP LONG TERM GOAL #3   Title Pt will recall 5 items on daily to-do list with use of memory strategies with rare min A over 2 sessions    Baseline 12-23-20    Time 6    Period Weeks    Status On-going      SLP LONG TERM GOAL #4   Title Pt will report improved cognitive functioning by 2 points by last ST session    Time 6    Period Weeks    Status On-going              Plan - 01/18/21 1221     Clinical Impression Statement Lydia Jenkins presents for OPST intervention secondary to meningiomia resection in May 2022. Pt reports significant improvements in cognition and success with trained memory compensations and techniques recommended by SLP. Pt reports success with using clarifying/confirming questions to aid and monitor recall as well as use of repetition/mental  picture strategies to aid recall and attention to tasks. Skilled ST is warranted to address mild cognitive communication changes to maximize functional independence and optimize functioning.    Speech Therapy Frequency 1x /week    Duration  8 weeks    Treatment/Interventions Compensatory strategies;Cueing hierarchy;Functional tasks;Patient/family education;Environmental controls;Cognitive reorganization;Compensatory techniques;Multimodal communcation approach;Language facilitation;Internal/external aids;SLP instruction and feedback    Potential to Achieve Goals Good    SLP Home Exercise Plan provided    Consulted and Agree with Plan of Care Patient             Patient will benefit from skilled therapeutic intervention in order to improve the following deficits and impairments:   Cognitive communication deficit    Problem List Patient Active Problem List   Diagnosis Date Noted   Brain mass 10/21/2020   S/P resection of meningioma 10/21/2020   Chronic headaches 10/14/2020   Frontal mass of brain 10/13/2020   Elevated LFTs    Dilated cbd, acquired    Abdominal pain 12/06/2017   Hypothyroidism 12/06/2017   Night sweats 03/11/2015   S/P endometrial ablation 03/12/2013   Premenstrual syndrome 03/12/2013   Anemia 08/14/2012   Pelvic pain in female 08/13/2012   Menorrhagia     Alinda Deem, MA CCC-SLP 01/18/2021, 2:07 PM  [facility] [address] [place], [place], [address] Phone: [phone]   Fax:  [phone]   Name: Lydia Jenkins MRN: [id number] Date of Birth: 1970/07/17

## 2021-01-18 NOTE — Therapy (Signed)
[facility] [address] [place], [place], [address] Phone: [phone]   Fax:  [phone]  Physical Therapy Treatment  Patient Details  Name: Lydia Jenkins MRN: [id number] Date of Birth: 1970-07-05 Referring Provider (PT): Vallarie Mare, MD   Encounter Date: 01/18/2021   PT End of Session - 01/18/21 1148     Visit Number 17    Number of Visits 26    Date for PT Re-Evaluation 03/08/21    Authorization Type UHC 2022    PT Start Time 1102    PT Stop Time [id number]    PT Time Calculation (min) 43 min    Activity Tolerance Patient tolerated treatment well    Behavior During Therapy [facility] for tasks assessed/performed             Past Medical History:  Diagnosis Date   Abnormal Pap smear    Anemia    iron deficient   Anxiety    Breast disorder    precencerous lesion, marker placed   Complication of anesthesia    Depression    Family history of adverse reaction to anesthesia    Hiatal hernia    Hypothyroidism    Liver dysfunction    Menorrhagia    Migraine headache with aura    PONV (postoperative nausea and vomiting)     Past Surgical History:  Procedure Laterality Date   ABDOMINOPLASTY  [id number]   APPLICATION OF CRANIAL NAVIGATION N/A 10/21/2020   Procedure: APPLICATION OF CRANIAL NAVIGATION;  Surgeon: Vallarie Mare, MD;  Location: [facility];  Service: Neurosurgery;  Laterality: N/A;   AUGMENTATION MAMMAPLASTY Bilateral 2009   BILIARY STENT PLACEMENT N/A 12/10/2017   Procedure: BILIARY STENT PLACEMENT;  Surgeon: Carol Ada, MD;  Location: [facility];  Service: Endoscopy;  Laterality: N/A;  plastic 8.5 x 5 stent placed   BREAST BIOPSY Left 2010   precancerous bx   CRANIOTOMY Left 10/21/2020   Procedure: LEFT FRONTAL CRANIOTOMY FOR RESECTION OF MENINGIOMA;  Surgeon: Vallarie Mare, MD;  Location: [place];  Service: Neurosurgery;  Laterality: Left;   CRYOTHERAPY     cervix-3x   ENDOMETRIAL ABLATION   2010   ERCP N/A 12/10/2017   Procedure: ENDOSCOPIC RETROGRADE CHOLANGIOPANCREATOGRAPHY (ERCP);  Surgeon: Carol Ada, MD;  Location: [facility];  Service: Endoscopy;  Laterality: N/A;  Balloon sweep of duct   LAPAROSCOPIC CHOLECYSTECTOMY  2008   SPHINCTEROTOMY  12/10/2017   Procedure: SPHINCTEROTOMY;  Surgeon: Carol Ada, MD;  Location: [facility];  Service: Endoscopy;;   TUBAL LIGATION  1998    There were no vitals filed for this visit.   Subjective Assessment - 01/18/21 1104     Subjective Was discharged from OT. Has been doing a little bit of driving and it has been going well. Vision therapy appt is tomorrow.    Pertinent History hypothyroidism, hiatal hernia, spincter of Oddi dysfunciton, depression, and anxiety.    Limitations Walking    Patient Stated Goals wants to be back normal (if not better) - played tennis 3-5x a week and was doing crossfit, likes hiking    Currently in Pain? No/denies    Pain Onset Today                               [facility] Adult PT Treatment/Exercise - 01/18/21 1105       Neuro Re-ed    Neuro Re-ed Details  NMR: trampoline bounces then  progressing to jumping - multiple reps, then performing gentle bounces with head turns x10 reps/nods x10 reps, alternating marching - beginning slow for more controlled SLS and then incr speed , jumping out and in and then diagonal split jumps (using BUE support for balance). no dizziness and pt's HR at 150 bpm afterwards      Knee/Hip Exercises: Aerobic   Elliptical level 1.0 - 1 minute forwards, 1 minute backwards HR after at 142 bpm - rested for 1 minute, and then 1 minute forwards with HR after 131 bpm             Vestibular Treatment/Exercise - 01/18/21 1115       Vestibular Treatment/Exercise   Vestibular Treatment Provided Gaze      X1 Viewing Horizontal   Foot Position with busy background    Comments seated 2 x 30 seconds, standing feet together 2 x 30 seconds, cues to incr  speed and for decr ROM to limit double vision. no dizziness      X1 Viewing Vertical   Foot Position with busy background    Comments seated 2 x 30 seconds, standing feet together 2 x 30 seconds. no dizziness                Balance Exercises - 01/18/21 1142       Balance Exercises: Standing   Other Standing Exercises on black side of BOSU: x5 reps mini squats, then x12 reps mini squat and ball toss with PT tech in different directions - cues to turn eyes/head to find ball. on blue side of BOSU: x10 reps B alternating SLS marching with intermittent UE support              PT Education - 01/18/21 1147     Education Details scheduling an additional 1x week throughout sept (dropping down from 2x a week due to pt's progress), will ugprade pt's HEP at next session and discussed/showed pt where to purchase foam pad for home for balance exercises.    Person(s) Educated Patient    Methods Explanation    Comprehension Verbalized understanding              PT Short Term Goals - 01/07/21 1148       PT SHORT TERM GOAL #1   Title Pt will ambulate at least 1,000' outdoors over unlevel grass surfaces with supervision and no eye patch in order to demo improved community mobility. ALL STGS DUE 01/28/21    Time 3    Period Weeks    Status New    Target Date 01/28/21      PT SHORT TERM GOAL #2   Title Pt will be able to jog 200' with supervision with HR remained stable in order to demo improved mobility for return to tennis.    Time 3    Period Weeks    Status New               PT Long Term Goals - 01/07/21 1151       PT LONG TERM GOAL #1   Title Pt will improve composite score of SOT to at least 80 and vestibular sensory analysis to [facility] in order to demo improved balance. ALL LTGS DUE 02/18/21    Time 6    Period Weeks    Status New    Target Date 02/18/21      PT LONG TERM GOAL #2   Title Pt will improve gait speed to at least  4.2 ft/sec with no AD and no eye patch  to demo improved community mobility.    Baseline 1.84 ft/sec with RW; 9.19 = 3.56 ft/sec R eye covered.    Time 6    Period Weeks    Status On-going      PT LONG TERM GOAL #3   Title Pt will be able to jog 400' with supervision with HR remained stable over unlevel surfaces in order to demo improved mobility for return to tennis.    Time 6    Period Weeks    Status New      PT LONG TERM GOAL #4   Title Pt will be independent with final HEP in order to build upon functional gains made in therapy.    Time 6    Period Weeks    Status New                   Plan - 01/18/21 1154     Clinical Impression Statement Continued to perform the elliptical today for aerobic activity with pt able to tolerate an additional minute with HR WFL. Initiated VOR x1 with a busy background in sitting/standing with no dizziness reported. Progressed higher level balance today on the trampoline working on jumping/SLS and mini squats on BOSU with tossing/tracking ball. No dizziness throughout session today. Pt's HR WFL throughout therapy with rest breaks taken. Pt is progressing well, will continue to progress towards LTGs. Will drop down to 1x week in september due to pt's progress    Personal Factors and Comorbidities Comorbidity 3+;Past/Current Experience    Comorbidities hypothyroidism, hiatal hernia, spincter of Oddi dysfunciton, depression, and anxiety.    Examination-Activity Limitations Stairs;Transfers;Squat;Locomotion Level    Examination-Participation Restrictions Community Activity;Driving;Cleaning;Laundry   playing tennis   Stability/Clinical Decision Making Evolving/Moderate complexity    Rehab Potential Good    PT Frequency 2x / week    PT Duration 6 weeks    PT Treatment/Interventions ADLs/Self Care Home Management;Stair training;Gait training;DME Instruction;Functional mobility training;Therapeutic activities;Therapeutic exercise;Neuromuscular re-education;Balance training;Patient/family  education;Vestibular;Visual/perceptual remediation/compensation    PT Next Visit Plan review pt's HEP and update to make harder since pt is doing so well. balance with EC on compliant surfaces. high level dynamic balance with visual tracking/dual tasking for pt to return to tennis. try jogging. pt might bring in her tennis rquest to future sessions to practice. progress elliptical. MAX HR IS 160 WITH ACTIVITY.    PT Home Exercise Plan [email or web address]    Consulted and Agree with Plan of Care Patient             Patient will benefit from skilled therapeutic intervention in order to improve the following deficits and impairments:  Abnormal gait, Decreased activity tolerance, Decreased balance, Dizziness, Decreased strength, Impaired vision/preception, Decreased endurance  Visit Diagnosis: Unsteadiness on feet  Other symptoms and signs involving the nervous system  Other abnormalities of gait and mobility     Problem List Patient Active Problem List   Diagnosis Date Noted   Brain mass 10/21/2020   S/P resection of meningioma 10/21/2020   Chronic headaches 10/14/2020   Frontal mass of brain 10/13/2020   Elevated LFTs    Dilated cbd, acquired    Abdominal pain 12/06/2017   Hypothyroidism 12/06/2017   Night sweats 03/11/2015   S/P endometrial ablation 03/12/2013   Premenstrual syndrome 03/12/2013   Anemia 08/14/2012   Pelvic pain in female 08/13/2012   Menorrhagia     Arliss Journey, PT, DPT  01/18/2021, 11:58  AM  [facility] [address] [place], [place], [address] Phone: [phone]   Fax:  [phone]  Name: Lydia Jenkins MRN: [id number] Date of Birth: 04-22-1971

## 2021-01-20 ENCOUNTER — Encounter: Payer: 59 | Admitting: Occupational Therapy

## 2021-01-20 ENCOUNTER — Other Ambulatory Visit: Payer: Self-pay

## 2021-01-20 ENCOUNTER — Ambulatory Visit: Payer: 59

## 2021-01-20 DIAGNOSIS — R2689 Other abnormalities of gait and mobility: Secondary | ICD-10-CM

## 2021-01-20 DIAGNOSIS — R42 Dizziness and giddiness: Secondary | ICD-10-CM

## 2021-01-20 DIAGNOSIS — R2681 Unsteadiness on feet: Secondary | ICD-10-CM

## 2021-01-20 NOTE — Patient Instructions (Signed)
Access Code: [email or web address] URL: [email or web address] Date: 01/20/2021 Prepared by: Baldomero Lamy  Exercises Romberg Stance Eyes Closed on Foam Pad - 1 x daily - 5 x weekly - 3 sets - 30 hold Standing Balance with Eyes Closed on Foam - 1 x daily - 5 x weekly - 2 sets - 10 reps Walking Tandem Stance - 1 x daily - 5 x weekly - 3 sets Forward Walking with Head Rotations - 2 x daily - 5 x weekly - 3 sets Walking with Head Nod - 2 x daily - 5 x weekly - 3 sets Standing Gaze Stabilization with Head Rotation - 1 x daily - 5 x weekly - 2-3 sets - 60 seconds hold Side Step with Agility Ladder - 1 x daily - 5 x weekly - 1 sets - 3 reps Carioca - 1 x daily - 5 x weekly - 1 sets - 3 reps

## 2021-01-20 NOTE — Therapy (Signed)
[facility] [address] [place], [place], [address] Phone: [phone]   Fax:  [phone]  Physical Therapy Treatment  Patient Details  Name: Lydia Jenkins MRN: [id number] Date of Birth: 11-30-1970 Referring Provider (PT): Vallarie Mare, MD   Encounter Date: 01/20/2021   PT End of Session - 01/20/21 1332     Visit Number 18    Number of Visits 26    Date for PT Re-Evaluation 03/08/21    Authorization Type UHC 2022    PT Start Time 1102    PT Stop Time [id number]    PT Time Calculation (min) 43 min    Activity Tolerance Patient tolerated treatment well    Behavior During Therapy [facility] for tasks assessed/performed             Past Medical History:  Diagnosis Date   Abnormal Pap smear    Anemia    iron deficient   Anxiety    Breast disorder    precencerous lesion, marker placed   Complication of anesthesia    Depression    Family history of adverse reaction to anesthesia    Hiatal hernia    Hypothyroidism    Liver dysfunction    Menorrhagia    Migraine headache with aura    PONV (postoperative nausea and vomiting)     Past Surgical History:  Procedure Laterality Date   ABDOMINOPLASTY  [id number]   APPLICATION OF CRANIAL NAVIGATION N/A 10/21/2020   Procedure: APPLICATION OF CRANIAL NAVIGATION;  Surgeon: Vallarie Mare, MD;  Location: [facility];  Service: Neurosurgery;  Laterality: N/A;   AUGMENTATION MAMMAPLASTY Bilateral 2009   BILIARY STENT PLACEMENT N/A 12/10/2017   Procedure: BILIARY STENT PLACEMENT;  Surgeon: Carol Ada, MD;  Location: [facility];  Service: Endoscopy;  Laterality: N/A;  plastic 8.5 x 5 stent placed   BREAST BIOPSY Left 2010   precancerous bx   CRANIOTOMY Left 10/21/2020   Procedure: LEFT FRONTAL CRANIOTOMY FOR RESECTION OF MENINGIOMA;  Surgeon: Vallarie Mare, MD;  Location: [facility];  Service: Neurosurgery;  Laterality: Left;   CRYOTHERAPY     cervix-3x   ENDOMETRIAL ABLATION   2010   ERCP N/A 12/10/2017   Procedure: ENDOSCOPIC RETROGRADE CHOLANGIOPANCREATOGRAPHY (ERCP);  Surgeon: Carol Ada, MD;  Location: [facility];  Service: Endoscopy;  Laterality: N/A;  Balloon sweep of duct   LAPAROSCOPIC CHOLECYSTECTOMY  2008   SPHINCTEROTOMY  12/10/2017   Procedure: SPHINCTEROTOMY;  Surgeon: Carol Ada, MD;  Location: [facility];  Service: Endoscopy;;   TUBAL LIGATION  1998    There were no vitals filed for this visit.   Subjective Assessment - 01/20/21 1104     Subjective Patient reports that vision appointment went well, will follow up in one month. Does reports she had some nausea and a HA with rolling in bed, curious if she has vertigo. Only happened once.    Pertinent History hypothyroidism, hiatal hernia, spincter of Oddi dysfunciton, depression, and anxiety.    Limitations Walking    Patient Stated Goals wants to be back normal (if not better) - played tennis 3-5x a week and was doing crossfit, likes hiking    Currently in Pain? No/denies    Pain Onset Today                     Vestibular Assessment - 01/20/21 0001       Other Tests   Comments VBI Screen: Negative      Positional  Testing   Dix-Hallpike Dix-Hallpike Right;Dix-Hallpike Left    Horizontal Canal Testing Horizontal Canal Right;Horizontal Canal Left      Dix-Hallpike Right   Dix-Hallpike Right Duration 0    Dix-Hallpike Right Symptoms No nystagmus      Dix-Hallpike Left   Dix-Hallpike Left Duration 0    Dix-Hallpike Left Symptoms No nystagmus      Horizontal Canal Right   Horizontal Canal Right Duration 0    Horizontal Canal Right Symptoms Normal      Horizontal Canal Left   Horizontal Canal Left Duration 0    Horizontal Canal Left Symptoms Normal             [facility] Adult PT Treatment/Exercise - 01/20/21 0001       Neuro Re-ed    Neuro Re-ed Details  Completed VOR x 1 horiz 2 x 40' with forward ambulation, followed by VOR x 1 vertical 2 x 40'. No dizziness.  Completed SLS with ball toss to rebounder with green ball x 10 reps on each BLE. CGA intermittently.      Exercises   Exercises Other Exercises    Other Exercises  Completed lateral quick side stepping x 2 laps down and back in hallway (no agility ladder). Then completed Carioca x 2 laps down and back. Updated HEP             Vestibular Treatment/Exercise - 01/20/21 0001       Vestibular Treatment/Exercise   Vestibular Treatment Provided Gaze    Gaze Exercises X1 Viewing Horizontal;X1 Viewing Vertical      X1 Viewing Horizontal   Foot Position completed standing feet together pattenered backgroud; then with addition of mirros    Comments x 60 seconds; no symptoms      X1 Viewing Vertical   Foot Position completed standing feet together pattenered backgroud; then with addition of mirros    Comments x 60 seconds; no symptoms            Updated HEP:  Access Code: [email or web address] URL: [email or web address] Date: 01/20/2021 Prepared by: Baldomero Lamy  Exercises Romberg Stance Eyes Closed on Foam Pad - 1 x daily - 5 x weekly - 3 sets - 30 hold Standing Balance with Eyes Closed on Foam - 1 x daily - 5 x weekly - 2 sets - 10 reps Walking Tandem Stance - 1 x daily - 5 x weekly - 3 sets Forward Walking with Head Rotations - 2 x daily - 5 x weekly - 3 sets Walking with Head Nod - 2 x daily - 5 x weekly - 3 sets Standing Gaze Stabilization with Head Rotation - 1 x daily - 5 x weekly - 2-3 sets - 60 seconds hold Side Step with Agility Ladder - 1 x daily - 5 x weekly - 1 sets - 3 reps Carioca - 1 x daily - 5 x weekly - 1 sets - 3 reps       PT Education - 01/20/21 1332     Education Details updated HEP    Person(s) Educated Patient    Methods Explanation;Demonstration    Comprehension Verbalized understanding;Returned demonstration              PT Short Term Goals - 01/07/21 1148       PT SHORT TERM GOAL #1   Title Pt will ambulate at least 1,000'  outdoors over unlevel grass surfaces with supervision and no eye patch in order to demo improved community mobility. ALL STGS DUE 01/28/21  Time 3    Period Weeks    Status New    Target Date 01/28/21      PT SHORT TERM GOAL #2   Title Pt will be able to jog 200' with supervision with HR remained stable in order to demo improved mobility for return to tennis.    Time 3    Period Weeks    Status New               PT Long Term Goals - 01/07/21 1151       PT LONG TERM GOAL #1   Title Pt will improve composite score of SOT to at least 80 and vestibular sensory analysis to [facility] in order to demo improved balance. ALL LTGS DUE 02/18/21    Time 6    Period Weeks    Status New    Target Date 02/18/21      PT LONG TERM GOAL #2   Title Pt will improve gait speed to at least 4.2 ft/sec with no AD and no eye patch to demo improved community mobility.    Baseline 1.84 ft/sec with RW; 9.19 = 3.56 ft/sec R eye covered.    Time 6    Period Weeks    Status On-going      PT LONG TERM GOAL #3   Title Pt will be able to jog 400' with supervision with HR remained stable over unlevel surfaces in order to demo improved mobility for return to tennis.    Time 6    Period Weeks    Status New      PT LONG TERM GOAL #4   Title Pt will be independent with final HEP in order to build upon functional gains made in therapy.    Time 6    Period Weeks    Status New                   Plan - 01/20/21 1339     Clinical Impression Statement Due to patient symptoms assessed for BPPV, negative positional testing. To monitor symptoms going forward. Rest of session focused on progressing VOR x 1 to patterned background and updating HEP. Able to complete quick side stepping and Carioca without any issues to work towards return to tennis.    Personal Factors and Comorbidities Comorbidity 3+;Past/Current Experience    Comorbidities hypothyroidism, hiatal hernia, spincter of Oddi dysfunciton,  depression, and anxiety.    Examination-Activity Limitations Stairs;Transfers;Squat;Locomotion Level    Examination-Participation Restrictions Community Activity;Driving;Cleaning;Laundry   playing tennis   Stability/Clinical Decision Making Evolving/Moderate complexity    Rehab Potential Good    PT Frequency 2x / week    PT Duration 6 weeks    PT Treatment/Interventions ADLs/Self Care Home Management;Stair training;Gait training;DME Instruction;Functional mobility training;Therapeutic activities;Therapeutic exercise;Neuromuscular re-education;Balance training;Patient/family education;Vestibular;Visual/perceptual remediation/compensation    PT Next Visit Plan How was HEP additions? continue to progress. high level dynamic balance with visual tracking/dual tasking for pt to return to tennis. try jogging. pt might bring in her tennis rquest to future sessions to practice. progress elliptical. MAX HR IS 160 WITH ACTIVITY.    PT Home Exercise Plan [email or web address]    Consulted and Agree with Plan of Care Patient             Patient will benefit from skilled therapeutic intervention in order to improve the following deficits and impairments:  Abnormal gait, Decreased activity tolerance, Decreased balance, Dizziness, Decreased strength, Impaired vision/preception, Decreased endurance  Visit Diagnosis: Unsteadiness on  feet  Dizziness and giddiness  Other abnormalities of gait and mobility     Problem List Patient Active Problem List   Diagnosis Date Noted   Brain mass 10/21/2020   S/P resection of meningioma 10/21/2020   Chronic headaches 10/14/2020   Frontal mass of brain 10/13/2020   Elevated LFTs    Dilated cbd, acquired    Abdominal pain 12/06/2017   Hypothyroidism 12/06/2017   Night sweats 03/11/2015   S/P endometrial ablation 03/12/2013   Premenstrual syndrome 03/12/2013   Anemia 08/14/2012   Pelvic pain in female 08/13/2012   Menorrhagia     Jones Bales, PT,  DPT 01/20/2021, 1:42 PM  [facility] [address] [place], [place], [address] Phone: [phone]   Fax:  [phone]  Name: Lydia Jenkins MRN: [id number] Date of Birth: 1970-08-16

## 2021-01-24 ENCOUNTER — Other Ambulatory Visit: Payer: Self-pay

## 2021-01-24 ENCOUNTER — Encounter: Payer: 59 | Admitting: Occupational Therapy

## 2021-01-24 ENCOUNTER — Ambulatory Visit: Payer: 59

## 2021-01-24 DIAGNOSIS — R2689 Other abnormalities of gait and mobility: Secondary | ICD-10-CM | POA: Diagnosis not present

## 2021-01-24 DIAGNOSIS — R2681 Unsteadiness on feet: Secondary | ICD-10-CM

## 2021-01-24 DIAGNOSIS — R42 Dizziness and giddiness: Secondary | ICD-10-CM

## 2021-01-24 NOTE — Therapy (Signed)
[facility] [address] [place], [place], [address] Phone: [phone]   Fax:  [phone]  Physical Therapy Treatment  Patient Details  Name: Lydia Jenkins MRN: [id number] Date of Birth: [birth year] Referring Provider (PT): Vallarie Mare, MD   Encounter Date: 01/24/2021   PT End of Session - 01/24/21 1108     Visit Number 19    Number of Visits 26    Date for PT Re-Evaluation 03/08/21    Authorization Type UHC 2022    PT Start Time 1017    PT Stop Time 1100    PT Time Calculation (min) 43 min    Activity Tolerance Patient tolerated treatment well    Behavior During Therapy WFL for tasks assessed/performed             Past Medical History:  Diagnosis Date   Abnormal Pap smear    Anemia    iron deficient   Anxiety    Breast disorder    precencerous lesion, marker placed   Complication of anesthesia    Depression    Family history of adverse reaction to anesthesia    Hiatal hernia    Hypothyroidism    Liver dysfunction    Menorrhagia    Migraine headache with aura    PONV (postoperative nausea and vomiting)     Past Surgical History:  Procedure Laterality Date   ABDOMINOPLASTY  [id number]   APPLICATION OF CRANIAL NAVIGATION N/A 10/21/2020   Procedure: APPLICATION OF CRANIAL NAVIGATION;  Surgeon: Vallarie Mare, MD;  Location[facility];  Service: Neurosurgery;  Laterality: N/A;   AUGMENTATION MAMMAPLASTY Bilateral 2009   BILIARY STENT PLACEMENT N/A 12/10/2017   Procedure: BILIARY STENT PLACEMENT;  Surgeon: Carol Ada, MD;  [facility]: [facility];  Service: Endoscopy;  Laterality: N/A;  plastic 8.5 x 5 stent placed   BREAST BIOPSY Left 2010   precancerous bx   CRANIOTOMY Left 10/21/2020   Procedure: LEFT FRONTAL CRANIOTOMY FOR RESECTION OF MENINGIOMA;  Surgeon: Vallarie Mare, MD;  Location: [facility];  Service: Neurosurgery;  Laterality: Left;   CRYOTHERAPY     cervix-3x   ENDOMETRIAL ABLATION   2010   ERCP N/A 12/10/2017   Procedure: ENDOSCOPIC RETROGRADE CHOLANGIOPANCREATOGRAPHY (ERCP);  Surgeon: Carol Ada, MD;  Location: [facility];  Service: Endoscopy;  Laterality: N/A;  Balloon sweep of duct   LAPAROSCOPIC CHOLECYSTECTOMY  2008   SPHINCTEROTOMY  12/10/2017   Procedure: SPHINCTEROTOMY;  Surgeon: Carol Ada, MD;  [facility];  Service: Endoscopy;;   TUBAL LIGATION  1998    There were no vitals filed for this visit.   Subjective Assessment - 01/24/21 1020     Subjective Patient reports that went to beach over the weekends. No new changes. Has not had any incidents of the HA associated with nausea.    Pertinent History hypothyroidism, hiatal hernia, spincter of Oddi dysfunciton, depression, and anxiety.    Limitations Walking    Patient Stated Goals wants to be back normal (if not better) - played tennis 3-5x a week and was doing crossfit, likes hiking    Currently in Pain? No/denies    Pain Onset Today              [facility] Adult PT Treatment/Exercise - 01/24/21 0001       Exercises   Exercises Other Exercises    Other Exercises  Completed lateral quick side stepping x 2 laps approx 40' each direction. Then completd forward jogging 3 x  29', no dizziness/imbalance reported, patient tolerating well. Then with large square with colored cones on each corner, completed quick transitions with running forward/side stepping/backwards to color PT called out working on quick transitions, with second trial added in racket to work on simulating hitting tennis ball. Increased hesistancy with backwards movement, but able to complete transition without issue. HR: 135-140, No dizziness.      Knee/Hip Exercises: Aerobic   Elliptical Complete Elliptical on Level 1.0: 1 minute 30 seconds forwards, 1 minute 3-0 seconds backwards HR after at 145 bpm - standing rest break for 1 minute, and then 1 minute forwards. Patient tolerating increase in time well.                  Balance Exercises - 01/24/21 0001       Balance Exercises: Standing   SLS Eyes open;Foam/compliant surface;2 reps;10 secs;15 secs;Limitations    SLS Limitations standing on inverted BOSU with alternating SLS, completed 2 x 10-15 seconds each. increased challenge on RLE > LLE.    Other Standing Exercises on black side of BOSU: x 10 reps mini squats, then standing with feet apart completed eyes open with horizontal/vertical head turns x 10 reps, then progressed to eyes closed x 10 reps with addition of head turns/nods. Then with eyes closed, completed static standing with eyes closed 3 x 30 seconds.               PT Education - 01/24/21 1107     Education Details provided HEP handout    Person(s) Educated Patient    Methods Explanation    Comprehension Verbalized understanding              PT Short Term Goals - 01/07/21 1148       PT SHORT TERM GOAL #1   Title Pt will ambulate at least 1,000' outdoors over unlevel grass surfaces with supervision and no eye patch in order to demo improved community mobility. ALL STGS DUE 01/28/21    Time 3    Period Weeks    Status New    Target Date 01/28/21      PT SHORT TERM GOAL #2   Title Pt will be able to jog 200' with supervision with HR remained stable in order to demo improved mobility for return to tennis.    Time 3    Period Weeks    Status New               PT Long Term Goals - 01/07/21 1151       PT LONG TERM GOAL #1   Title Pt will improve composite score of SOT to at least 80 and vestibular sensory analysis to [facility] in order to demo improved balance. ALL LTGS DUE 02/18/21    Time 6    Period Weeks    Status New    Target Date 02/18/21      PT LONG TERM GOAL #2   Title Pt will improve gait speed to at least 4.2 ft/sec with no AD and no eye patch to demo improved community mobility.    Baseline 1.84 ft/sec with RW; 9.19 = 3.56 ft/sec R eye covered.    Time 6    Period Weeks    Status On-going       PT LONG TERM GOAL #3   Title Pt will be able to jog 400' with supervision with HR remained stable over unlevel surfaces in order to demo improved mobility for return to tennis.  Time 6    Period Weeks    Status New      PT LONG TERM GOAL #4   Title Pt will be independent with final HEP in order to build upon functional gains made in therapy.    Time 6    Period Weeks    Status New                   Plan - 01/24/21 1108     Clinical Impression Statement Today's skilled PT session foucsed on continued activity tolerance to increased physical activity including elliptical. Also contineud to work on incorporating short distance jogging and transition changes to work towards return to tennis. Continued balance exercises on BOSU, with most challenge noted with SLS on RLE. Patient tolerating all activites well, with no dizziness reported.    Personal Factors and Comorbidities Comorbidity 3+;Past/Current Experience    Comorbidities hypothyroidism, hiatal hernia, spincter of Oddi dysfunciton, depression, and anxiety.    Examination-Activity Limitations Stairs;Transfers;Squat;Locomotion Level    Examination-Participation Restrictions Community Activity;Driving;Cleaning;Laundry   playing tennis   Stability/Clinical Decision Making Evolving/Moderate complexity    Rehab Potential Good    PT Frequency 2x / week    PT Duration 6 weeks    PT Treatment/Interventions ADLs/Self Care Home Management;Stair training;Gait training;DME Instruction;Functional mobility training;Therapeutic activities;Therapeutic exercise;Neuromuscular re-education;Balance training;Patient/family education;Vestibular;Visual/perceptual remediation/compensation    PT Next Visit Plan Check STGs, continue to progress HEP. high level dynamic balance with visual tracking/dual tasking for pt to return to tennis. try jogging. pt might bring in her tennis rquest to future sessions to practice. progress elliptical. MAX HR IS 160  WITH ACTIVITY.    PT Home Exercise Plan [id number]    Consulted and Agree with Plan of Care Patient             Patient will benefit from skilled therapeutic intervention in order to improve the following deficits and impairments:  Abnormal gait, Decreased activity tolerance, Decreased balance, Dizziness, Decreased strength, Impaired vision/preception, Decreased endurance  Visit Diagnosis: Unsteadiness on feet  Dizziness and giddiness  Other abnormalities of gait and mobility     Problem List Patient Active Problem List   Diagnosis Date Noted   Brain mass 10/21/2020   S/P resection of meningioma 10/21/2020   Chronic headaches 10/14/2020   Frontal mass of brain 10/13/2020   Elevated LFTs    Dilated cbd, acquired    Abdominal pain 12/06/2017   Hypothyroidism 12/06/2017   Night sweats 03/11/2015   S/P endometrial ablation 03/12/2013   Premenstrual syndrome 03/12/2013   Anemia 08/14/2012   Pelvic pain in female 08/13/2012   Menorrhagia     Jones Bales, PT, DPT 01/24/2021, 11:12 AM  [facility] [address] [place], [place], [address] Phone: [phone]   Fax:  [phone]  Name: Lydia Jenkins MRN: [id number] Date of Birth: 1970/07/23

## 2021-01-26 ENCOUNTER — Encounter: Payer: 59 | Admitting: Occupational Therapy

## 2021-01-26 ENCOUNTER — Ambulatory Visit: Payer: 59

## 2021-02-02 ENCOUNTER — Ambulatory Visit: Payer: 59 | Admitting: Physical Therapy

## 2021-02-09 ENCOUNTER — Ambulatory Visit: Payer: 59 | Attending: Family Medicine

## 2021-02-09 ENCOUNTER — Ambulatory Visit: Payer: 59

## 2021-02-09 ENCOUNTER — Other Ambulatory Visit: Payer: Self-pay

## 2021-02-09 DIAGNOSIS — R2689 Other abnormalities of gait and mobility: Secondary | ICD-10-CM | POA: Diagnosis present

## 2021-02-09 DIAGNOSIS — R42 Dizziness and giddiness: Secondary | ICD-10-CM | POA: Insufficient documentation

## 2021-02-09 DIAGNOSIS — R2681 Unsteadiness on feet: Secondary | ICD-10-CM | POA: Insufficient documentation

## 2021-02-09 DIAGNOSIS — R41841 Cognitive communication deficit: Secondary | ICD-10-CM | POA: Diagnosis not present

## 2021-02-09 NOTE — Therapy (Signed)
[facility] [address] [place], [place], [address] Phone: [phone]   Fax:  [phone]  Speech Language Pathology Treatment/Discharge  Patient Details  Name: Lydia Jenkins MRN: [id number] Date of Birth: [birth year] Referring Provider (SLP): Vallarie Mare, MD   Encounter Date: 02/09/2021   End of Session - 02/09/21 1220     Visit Number 6    Number of Visits 17    Date for SLP Re-Evaluation 02/07/21   extended to 9/14 for discharge   SLP Start Time 1145    SLP Stop Time  1215    SLP Time Calculation (min) 30 min    Activity Tolerance Patient tolerated treatment well             Past Medical History:  Diagnosis Date   Abnormal Pap smear    Anemia    iron deficient   Anxiety    Breast disorder    precencerous lesion, marker placed   Complication of anesthesia    Depression    Family history of adverse reaction to anesthesia    Hiatal hernia    Hypothyroidism    Liver dysfunction    Menorrhagia    Migraine headache with aura    PONV (postoperative nausea and vomiting)     Past Surgical History:  Procedure Laterality Date   ABDOMINOPLASTY  6789   APPLICATION OF CRANIAL NAVIGATION N/A 10/21/2020   Procedure: APPLICATION OF CRANIAL NAVIGATION;  Surgeon: Vallarie Mare, MD;  Location: [facility];  Service: Neurosurgery;  Laterality: N/A;   AUGMENTATION MAMMAPLASTY Bilateral 2009   BILIARY STENT PLACEMENT N/A 12/10/2017   Procedure: BILIARY STENT PLACEMENT;  Surgeon: Carol Ada, MD;  [facility];  Service: Endoscopy;  Laterality: N/A;  plastic 8.5 x 5 stent placed   BREAST BIOPSY Left 2010   precancerous bx   CRANIOTOMY Left 10/21/2020   Procedure: LEFT FRONTAL CRANIOTOMY FOR RESECTION OF MENINGIOMA;  Surgeon: Vallarie Mare, MD;  Location: [facility];  Service: Neurosurgery;  Laterality: Left;   CRYOTHERAPY     cervix-3x   ENDOMETRIAL ABLATION  2010   ERCP N/A 12/10/2017    Procedure: ENDOSCOPIC RETROGRADE CHOLANGIOPANCREATOGRAPHY (ERCP);  Surgeon: Carol Ada, MD;  Location[facility];  Service: Endoscopy;  Laterality: N/A;  Balloon sweep of duct   LAPAROSCOPIC CHOLECYSTECTOMY  2008   SPHINCTEROTOMY  12/10/2017   Procedure: SPHINCTEROTOMY;  Surgeon: Carol Ada, MD;  [facility];  Service: Endoscopy;;   TUBAL LIGATION  1998    There were no vitals filed for this visit.   Subjective Assessment - 02/09/21 1216     Subjective "a lot has happened since I last saw you"    Currently in Pain? No/denies            SPEECH THERAPY DISCHARGE SUMMARY  Visits from Start of Care: 6  Current functional level related to goals / functional outcomes: Lydia Jenkins presents with significant improvements in cognitive linguistic functioning and requested ST discharge this date. Pt is utilizing learned compensations with success and consistency. Pt verbalized understanding and agreement with ST discharge.    Remaining deficits: NA   Education / Equipment: Memory compensations, functional application of compensations, education for caregivers  Patient agrees to discharge. Patient goals were met. Patient is being discharged due to being pleased with the current functional level..          ADULT SLP TREATMENT - 02/09/21 1216       General Information   Behavior/Cognition  Alert;Pleasant mood;Cooperative      Treatment Provided   Treatment provided Cognitive-Linquistic      Cognitive-Linquistic Treatment   Treatment focused on Cognition;Patient/family/caregiver education    Skilled Treatment New update provided re: recent MRI results with new tumor growth suspected. Pt recalled the scenario and discussion with MD without difficulty. Pt reports she is seeking mental health counseling. SLP provided recommendations to aid open communication with patient and her family/friends re: change in condition. No new onset of cognitive challenges reported at  this time. Pt is effectively mangaing with current compensations. Pt will update [facility] staff and may need an additional referral in a few months after care team has identified best option.      Assessment / Recommendations / Plan   Plan Discharge SLP treatment due to (comment)   pleased with current progress     Progression Toward Goals   Progression toward goals Goals met, education completed, patient discharged from [facility] Education - 02/09/21 1220     Education Details open communication of challenges with family and friends, discharge summary    Person(s) Educated Patient    Methods Explanation;Demonstration    Comprehension Verbalized understanding;Returned demonstration              SLP Short Term Goals - 02/09/21 1412       SLP SHORT TERM GOAL #1   Title Pt will use memory compensations for appointments, medicine management, and other daily activities with occasional min A over 2 sessions    Baseline 12-23-20, 01-10-21    Status Achieved      SLP SHORT TERM GOAL #2   Title Pt will demonstrate awareness of errors on mod complex structured tasks with 80% accuracy given rare min A over 2 sessions    Status Deferred      SLP SHORT TERM GOAL #3   Title Pt will recall 3 items on daily to-do list with use of memory strategy with rare min A over 2 sessions    Baseline 12-23-20, 01-18-21    Status Achieved      SLP SHORT TERM GOAL #4   Title Pt will independently orient self to day of week with use of memory strategy for 2/3 opportunities given rare min A    Baseline 12-23-20, 01-19-32    Status Achieved      SLP SHORT TERM GOAL #5   Title Pt will complete cognitive communication PROM next session    Time --    Period --    Status Deferred              SLP Long Term Goals - 02/09/21 1412       SLP LONG TERM GOAL #1   Title Pt will use memory compensations for appointments, medicine management, and other daily activities with rare min A over 2  sessions    Baseline 12-23-20, 01-18-21    Status Achieved      SLP LONG TERM GOAL #2   Title Pt will demonstrate awareness of errors on mod complex structured tasks with 90% accuracy given rare min A over 2 sessions    Time --    Period --    Status Deferred      SLP LONG TERM GOAL #3   Title Pt will recall 5 items on daily to-do list with use of memory strategies with rare min A over 2 sessions    Baseline 12-23-20  Time --    Period --    Status Achieved      SLP LONG TERM GOAL #4   Title Pt will report improved cognitive functioning by 2 points by last ST session    Time --    Period --    Status Deferred              Plan - 02/09/21 1409     Clinical Impression Statement Lydia Jenkins presents for OPST intervention secondary to meningiomia resection in May 2022. Pt reports significant improvements in cognition since initiation of ST services and success using trained memory compensations and techniques recommended by SLP. Pt is pleased with current progress and requested ST discharge this date. Medical update provided, in which new tumor growth suspected and pt awaiting repeat MRI and care team meeting to decide next steps. No new onset of cognitive changes reported. Pt aware she will need another script to return to ST services.    Treatment/Interventions Compensatory strategies;Cueing hierarchy;Functional tasks;Patient/family education;Environmental controls;Cognitive reorganization;Compensatory techniques;Multimodal communcation approach;Language facilitation;Internal/external aids;SLP instruction and feedback    Consulted and Agree with Plan of Care Patient             Patient will benefit from skilled therapeutic intervention in order to improve the following deficits and impairments:   Cognitive communication deficit    Problem List Patient Active Problem List   Diagnosis Date Noted   Brain mass 10/21/2020   S/P resection of meningioma 10/21/2020   Chronic  headaches 10/14/2020   Frontal mass of brain 10/13/2020   Elevated LFTs    Dilated cbd, acquired    Abdominal pain 12/06/2017   Hypothyroidism 12/06/2017   Night sweats 03/11/2015   S/P endometrial ablation 03/12/2013   Premenstrual syndrome 03/12/2013   Anemia 08/14/2012   Pelvic pain in female 08/13/2012   Menorrhagia     Alinda Deem, MA CCC-SLP 02/09/2021, 2:13 PM  [facility] [address] [place], [place], [address] Phone: [phone]   Fax:  [phone]   Name: Lydia Jenkins MRN: [id number] Date of Birth: 07-Apr-1971

## 2021-02-09 NOTE — Therapy (Signed)
[facility] [address] [place], [place], [address] Phone: [phone]   Fax:  [phone]  Physical Therapy Treatment Arrived - No Charge  Patient Details  Name: Lydia Jenkins MRN: [id number] Date of Birth: [birth year] Referring Provider (PT): Vallarie Mare, MD   Encounter Date: 02/09/2021   PT End of Session - 02/09/21 1217     Visit Number 19   arrived no charge   Number of Visits 26    Date for PT Re-Evaluation 03/08/21    Authorization Type UHC 2022    PT Start Time 1101    PT Stop Time 1144    PT Time Calculation (min) 43 min    Activity Tolerance Patient tolerated treatment well    Behavior During Therapy [facility] for tasks assessed/performed             Past Medical History:  Diagnosis Date   Abnormal Pap smear    Anemia    iron deficient   Anxiety    Breast disorder    precencerous lesion, marker placed   Complication of anesthesia    Depression    Family history of adverse reaction to anesthesia    Hiatal hernia    Hypothyroidism    Liver dysfunction    Menorrhagia    Migraine headache with aura    PONV (postoperative nausea and vomiting)     Past Surgical History:  Procedure Laterality Date   ABDOMINOPLASTY  [id number]   APPLICATION OF CRANIAL NAVIGATION N/A 10/21/2020   Procedure: APPLICATION OF CRANIAL NAVIGATION;  Surgeon: Vallarie Mare, MD;  Location: [facility];  Service: Neurosurgery;  Laterality: N/A;   AUGMENTATION MAMMAPLASTY Bilateral 2009   BILIARY STENT PLACEMENT N/A 12/10/2017   Procedure: BILIARY STENT PLACEMENT;  Surgeon: Carol Ada, MD;  [facility];  Service: Endoscopy;  Laterality: N/A;  plastic 8.5 x 5 stent placed   BREAST BIOPSY Left 2010   precancerous bx   CRANIOTOMY Left 10/21/2020   Procedure: LEFT FRONTAL CRANIOTOMY FOR RESECTION OF MENINGIOMA;  Surgeon: Vallarie Mare, MD;  Location: [facility];  Service: Neurosurgery;  Laterality: Left;   CRYOTHERAPY      cervix-3x   ENDOMETRIAL ABLATION  2010   ERCP N/A 12/10/2017   Procedure: ENDOSCOPIC RETROGRADE CHOLANGIOPANCREATOGRAPHY (ERCP);  Surgeon: Carol Ada, MD;  Location: [facility];  Service: Endoscopy;  Laterality: N/A;  Balloon sweep of duct   LAPAROSCOPIC CHOLECYSTECTOMY  2008   SPHINCTEROTOMY  12/10/2017   Procedure: SPHINCTEROTOMY;  Surgeon: Carol Ada, MD;  [facility];  Service: Endoscopy;;   TUBAL LIGATION  1998      Upon arrival to session, patient educating PT that MRI was completed and they are concerned that the meningioma has begun to return. Will find out in a few weeks if it is scar tissue or tumor, but per patient reports believed to be tumor due to speed of growth. In a few weeks will have firm answers on findings, and will begin to determine next route of treatment via surgery and/or radiation.   PT and Patient had long conversation regarding coping mechanisms regarding current medical issues to help manage stress, including journaling. Patient is also beginning to see mental health counselor as well (started last week). Patient provided verbal understanding of all education provided.   From a physical standpoint, patient has returned to tennis at this time and is a functional Ambulator without AD or assistance. PT and patient agree to place PT services on hold  at this time, with plan to return after determine next steps with treatment.         PT Short Term Goals - 01/07/21 1148       PT SHORT TERM GOAL #1   Title Pt will ambulate at least 1,000' outdoors over unlevel grass surfaces with supervision and no eye patch in order to demo improved community mobility. ALL STGS DUE 01/28/21    Time 3    Period Weeks    Status New    Target Date 01/28/21      PT SHORT TERM GOAL #2   Title Pt will be able to jog 200' with supervision with HR remained stable in order to demo improved mobility for return to tennis.    Time 3    Period Weeks    Status New                PT Long Term Goals - 01/07/21 1151       PT LONG TERM GOAL #1   Title Pt will improve composite score of SOT to at least 80 and vestibular sensory analysis to [facility] in order to demo improved balance. ALL LTGS DUE 02/18/21    Time 6    Period Weeks    Status New    Target Date 02/18/21      PT LONG TERM GOAL #2   Title Pt will improve gait speed to at least 4.2 ft/sec with no AD and no eye patch to demo improved community mobility.    Baseline 1.84 ft/sec with RW; 9.19 = 3.56 ft/sec R eye covered.    Time 6    Period Weeks    Status On-going      PT LONG TERM GOAL #3   Title Pt will be able to jog 400' with supervision with HR remained stable over unlevel surfaces in order to demo improved mobility for return to tennis.    Time 6    Period Weeks    Status New      PT LONG TERM GOAL #4   Title Pt will be independent with final HEP in order to build upon functional gains made in therapy.    Time 6    Period Weeks    Status New                    Patient will benefit from skilled therapeutic intervention in order to improve the following deficits and impairments:     Visit Diagnosis: Unsteadiness on feet  Dizziness and giddiness  Other abnormalities of gait and mobility     Problem List Patient Active Problem List   Diagnosis Date Noted   Brain mass 10/21/2020   S/P resection of meningioma 10/21/2020   Chronic headaches 10/14/2020   Frontal mass of brain 10/13/2020   Elevated LFTs    Dilated cbd, acquired    Abdominal pain 12/06/2017   Hypothyroidism 12/06/2017   Night sweats 03/11/2015   S/P endometrial ablation 03/12/2013   Premenstrual syndrome 03/12/2013   Anemia 08/14/2012   Pelvic pain in female 08/13/2012   Menorrhagia     Jones Bales, PT, DPT 02/09/2021, 12:19 PM  [facility] [address] [place], [place], [address] Phone: [phone]   Fax:   [phone]  Name: Lydia Jenkins MRN: [id number] Date of Birth: 1971/05/12

## 2021-02-16 ENCOUNTER — Ambulatory Visit: Payer: 59

## 2021-02-23 ENCOUNTER — Ambulatory Visit: Payer: 59 | Admitting: Physical Therapy

## 2021-03-14 ENCOUNTER — Other Ambulatory Visit: Payer: Self-pay | Admitting: Neurosurgery

## 2021-03-14 DIAGNOSIS — D329 Benign neoplasm of meninges, unspecified: Secondary | ICD-10-CM

## 2021-03-27 ENCOUNTER — Ambulatory Visit
Admission: RE | Admit: 2021-03-27 | Discharge: 2021-03-27 | Disposition: A | Discharge status: Home / Self Care | Payer: 59 | Source: Ambulatory Visit | Attending: Neurosurgery | Admitting: Neurosurgery

## 2021-03-27 ENCOUNTER — Other Ambulatory Visit: Payer: Self-pay

## 2021-03-27 DIAGNOSIS — D329 Benign neoplasm of meninges, unspecified: Secondary | ICD-10-CM

## 2021-03-27 MED ORDER — GADOBENATE DIMEGLUMINE 529 MG/ML IV SOLN
13.0000 mL | Freq: Once | INTRAVENOUS | Status: AC | PRN
  Administered 2021-03-27: 13 mL via INTRAVENOUS

## 2021-05-26 ENCOUNTER — Encounter: Payer: 59 | Admitting: Obstetrics and Gynecology

## 2021-06-16 ENCOUNTER — Other Ambulatory Visit: Payer: Self-pay | Admitting: Neurosurgery

## 2021-06-16 DIAGNOSIS — D329 Benign neoplasm of meninges, unspecified: Secondary | ICD-10-CM

## 2021-06-28 ENCOUNTER — Encounter: Payer: Self-pay | Admitting: Nurse Practitioner

## 2021-06-28 ENCOUNTER — Ambulatory Visit (INDEPENDENT_AMBULATORY_CARE_PROVIDER_SITE_OTHER): Payer: No Typology Code available for payment source | Admitting: Nurse Practitioner

## 2021-06-28 ENCOUNTER — Other Ambulatory Visit: Payer: Self-pay

## 2021-06-28 VITALS — BP 114/78 | Ht 63.5 in | Wt 152.0 lb

## 2021-06-28 DIAGNOSIS — Z01419 Encounter for gynecological examination (general) (routine) without abnormal findings: Secondary | ICD-10-CM

## 2021-06-28 DIAGNOSIS — Z79890 Hormone replacement therapy (postmenopausal): Secondary | ICD-10-CM | POA: Diagnosis not present

## 2021-06-28 NOTE — Progress Notes (Signed)
Lydia Jenkins 03/22/1971 [id number]   History:  51 y.o. G3P3003 presents for annual exam. Postmenopausal - on HRT, managed by [facility]. HRT was discontinued after brain tumor was discovered but her menopausal symptoms were intolerable and she was restarted on lower dose about a month ago. Cryosurgery greater than 20 years ago, normal paps since. 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 History of hypothyroidism, migraines.   Gynecologic History No LMP recorded. Patient has had an ablation. Period Duration (Days): amenorrhea, hx endometrial ablation Contraception/Family planning: post menopausal status and tubal ligation Sexually active: Yes  Health Maintenance Last Pap: 04/30/2019. Results were: Normal, 5-year repeat Last mammogram: 08/11/2020. Results were: right breast asymmetry, normal on follow up imaging Last colonoscopy: 2014. Results were: Normal Last Dexa: Not indicated  Past medical history, past surgical history, family history and social history were all reviewed and documented in the EPIC chart. Married. 2 daughters, 1 son. Retired.   ROS:  A ROS was performed and pertinent positives and negatives are included.  Exam:  Vitals:   06/28/21 1056  BP: 114/78  Weight: 152 lb (68.9 kg)  Height: 5' 3.5" (1.613 m)   Body mass index is 26.5 kg/m.  General appearance:  Normal Thyroid:  Symmetrical, normal in size, without palpable masses or nodularity. Respiratory  Auscultation:  Clear without wheezing or rhonchi Cardiovascular  Auscultation:  Regular rate, without rubs, murmurs or gallops  Edema/varicosities:  Not grossly evident Abdominal  Soft,nontender, without masses, guarding or rebound.  Liver/spleen:  No organomegaly noted  Hernia:  None appreciated  Skin  Inspection:  Grossly normal Breasts: Examined lying and sitting. Bilateral implants noted  Right: Without masses, retractions, nipple discharge or axillary  adenopathy.   Left: Without masses, retractions, nipple discharge or axillary adenopathy. Genitourinary   Inguinal/mons:  Normal without inguinal adenopathy  External genitalia:  Normal appearing vulva with no masses, tenderness, or lesions  BUS/Urethra/Skene's glands:  Normal  Vagina:  Normal appearing with normal color and discharge, no lesions  Cervix:  Normal appearing without discharge or lesions  Uterus:  Normal in size, shape and contour.  Midline and mobile, nontender  Adnexa/parametria:     Rt: Normal in size, without masses or tenderness.   Lt: Normal in size, without masses or tenderness.  Anus and perineum: Normal  Digital rectal exam: Normal sphincter tone without palpated masses or tenderness  Patient informed chaperone available to be present for breast and pelvic exam. Patient has requested no chaperone to be present. Patient has been advised what will be completed during breast and pelvic exam.   Assessment/Plan:  51 y.o. G3P3003 for annual exam.   Well female exam with routine gynecological exam - Education provided on SBEs, importance of preventative screenings, current guidelines, high calcium diet, regular exercise, and multivitamin daily. Labs with PCP.   Hormone replacement therapy - managed by [facility]. On Estradiol patch 0.0375 mg twice weekly, Prometrium 100 mg nightly, and uses testosterone cream. Stopped use last year when brain tumor was discovered but restarted 1 month ago due to unacceptable menopausal symptoms. She follows up with them every 3 months.   Screening for cervical cancer - Cryosurgery greater than 20 years ago, normal paps since.  Will repeat at 5-year interval per guidelines.  Screening for breast cancer -  Most recent mammogram 08/11/2020 showed right breast asymmetry, normal on follow up imaging. Continue annual screenings.  Normal breast exam today.  Screening for colon cancer - Normal colonoscopy 2014. Recommend repeating  now.   Return in 1 year for annual.     Tamela Gammon DNP, 12:34 PM 06/28/2021

## 2021-07-03 ENCOUNTER — Other Ambulatory Visit: Payer: Self-pay

## 2021-07-03 ENCOUNTER — Ambulatory Visit
Admission: RE | Admit: 2021-07-03 | Discharge: 2021-07-03 | Disposition: A | Discharge status: Home / Self Care | Payer: No Typology Code available for payment source | Source: Ambulatory Visit | Attending: Neurosurgery | Admitting: Neurosurgery

## 2021-07-03 DIAGNOSIS — D329 Benign neoplasm of meninges, unspecified: Secondary | ICD-10-CM

## 2021-07-03 MED ORDER — GADOBENATE DIMEGLUMINE 529 MG/ML IV SOLN
14.0000 mL | Freq: Once | INTRAVENOUS | Status: AC | PRN
  Administered 2021-07-03: 14 mL via INTRAVENOUS

## 2021-10-07 ENCOUNTER — Other Ambulatory Visit: Payer: Self-pay | Admitting: Nurse Practitioner

## 2021-10-07 ENCOUNTER — Other Ambulatory Visit: Payer: Self-pay | Admitting: Obstetrics and Gynecology

## 2021-10-07 DIAGNOSIS — Z1231 Encounter for screening mammogram for malignant neoplasm of breast: Secondary | ICD-10-CM

## 2021-10-13 ENCOUNTER — Ambulatory Visit: Payer: No Typology Code available for payment source

## 2021-10-13 ENCOUNTER — Ambulatory Visit
Admission: RE | Admit: 2021-10-13 | Discharge: 2021-10-13 | Disposition: A | Discharge status: Home / Self Care | Payer: No Typology Code available for payment source | Source: Ambulatory Visit | Attending: Nurse Practitioner | Admitting: Nurse Practitioner

## 2021-10-13 DIAGNOSIS — Z1231 Encounter for screening mammogram for malignant neoplasm of breast: Secondary | ICD-10-CM

## 2022-03-01 ENCOUNTER — Other Ambulatory Visit: Payer: Self-pay | Admitting: Neurosurgery

## 2022-03-01 DIAGNOSIS — D329 Benign neoplasm of meninges, unspecified: Secondary | ICD-10-CM

## 2022-03-23 ENCOUNTER — Ambulatory Visit
Admission: RE | Admit: 2022-03-23 | Discharge: 2022-03-23 | Disposition: A | Discharge status: Home / Self Care | Payer: No Typology Code available for payment source | Source: Ambulatory Visit | Attending: Neurosurgery | Admitting: Neurosurgery

## 2022-03-23 DIAGNOSIS — D329 Benign neoplasm of meninges, unspecified: Secondary | ICD-10-CM

## 2022-03-23 MED ORDER — GADOPICLENOL 0.5 MMOL/ML IV SOLN
6.0000 mL | Freq: Once | INTRAVENOUS | Status: AC | PRN
  Administered 2022-03-23: 6 mL via INTRAVENOUS

## 2022-06-29 ENCOUNTER — Encounter: Payer: Self-pay | Admitting: Nurse Practitioner

## 2022-06-29 ENCOUNTER — Ambulatory Visit (INDEPENDENT_AMBULATORY_CARE_PROVIDER_SITE_OTHER): Payer: No Typology Code available for payment source | Admitting: Nurse Practitioner

## 2022-06-29 VITALS — BP 104/62 | HR 73 | Ht 64.0 in | Wt 135.0 lb

## 2022-06-29 DIAGNOSIS — Z79890 Hormone replacement therapy (postmenopausal): Secondary | ICD-10-CM | POA: Diagnosis not present

## 2022-06-29 DIAGNOSIS — Z01419 Encounter for gynecological examination (general) (routine) without abnormal findings: Secondary | ICD-10-CM | POA: Diagnosis not present

## 2022-06-29 NOTE — Progress Notes (Signed)
   Lydia Jenkins 1970/11/26 [id number]   History:  52 y.o. G3P3003 presents for annual exam. Postmenopausal - on HRT, managed by [facility]. HRT was discontinued after brain tumor was discovered but her menopausal symptoms were intolerable and she was restarted last year. Cryosurgery greater than 20 years ago, normal paps since. 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 History of hypothyroidism, migraines.   Gynecologic History No LMP recorded. Patient is postmenopausal.   Contraception/Family planning: post menopausal status and tubal ligation Sexually active: Yes  Health Maintenance Last Pap: 04/30/2019. Results were: Normal neg HPV, 5-year repeat Last mammogram: 10/13/2021. Results were: Normal Last colonoscopy: 2014. Results were: Normal Last Dexa: Not indicated  Past medical history, past surgical history, family history and social history were all reviewed and documented in the EPIC chart. Married. 2 daughters, 1 son. Retired.   ROS:  A ROS was performed and pertinent positives and negatives are included.  Exam:  Vitals:   06/29/22 1100  BP: 104/62  Pulse: 73  SpO2: 99%  Weight: 135 lb (61.2 kg)  Height: '5\' 4"'$  (1.626 m)    Body mass index is 23.17 kg/m.  General appearance:  Normal Thyroid:  Symmetrical, normal in size, without palpable masses or nodularity. Respiratory  Auscultation:  Clear without wheezing or rhonchi Cardiovascular  Auscultation:  Regular rate, without rubs, murmurs or gallops  Edema/varicosities:  Not grossly evident Abdominal  Soft,nontender, without masses, guarding or rebound.  Liver/spleen:  No organomegaly noted  Hernia:  None appreciated  Skin  Inspection:  Grossly normal Breasts: Examined lying and sitting. Bilateral implants noted  Right: Without masses, retractions, nipple discharge or axillary adenopathy.   Left: Without masses, retractions, nipple discharge or axillary adenopathy. Genitourinary   Inguinal/mons:   Normal without inguinal adenopathy  External genitalia:  Normal appearing vulva with no masses, tenderness, or lesions  BUS/Urethra/Skene's glands:  Normal  Vagina:  Normal appearing with normal color and discharge, no lesions  Cervix:  Normal appearing without discharge or lesions  Uterus:  Normal in size, shape and contour.  Midline and mobile, nontender  Adnexa/parametria:     Rt: Normal in size, without masses or tenderness.   Lt: Normal in size, without masses or tenderness.  Anus and perineum: Normal  Digital rectal exam: Deferred  Patient informed chaperone available to be present for breast and pelvic exam. Patient has requested no chaperone to be present. Patient has been advised what will be completed during breast and pelvic exam.   Assessment/Plan:  52 y.o. G3P3003 for annual exam.   Well female exam with routine gynecological exam - Education provided on SBEs, importance of preventative screenings, current guidelines, high calcium diet, regular exercise, and multivitamin daily. Labs with PCP.   Hormone replacement therapy - Managed by [facility]. On Estradiol patch 0.075 mg twice weekly, Prometrium 100 mg nightly, and uses testosterone cream. Stopped use in 2022 when brain tumor was discovered but restarted last year due to unacceptable menopausal symptoms. She follows up with them every 3 months.   Screening for cervical cancer - Cryosurgery greater than 20 years ago, normal paps since.  Will repeat at 5-year interval per guidelines.  Screening for breast cancer -  Normal mammogram history. Continue annual screenings.  Normal breast exam today.  Screening for colon cancer - Normal colonoscopy 2014. Will repeat at GI's recommended interval.   Return in 1 year for annual.     Tamela Gammon DNP, 11:46 AM 06/29/2022

## 2022-09-20 ENCOUNTER — Other Ambulatory Visit: Payer: Self-pay | Admitting: Nurse Practitioner

## 2022-09-20 DIAGNOSIS — Z1231 Encounter for screening mammogram for malignant neoplasm of breast: Secondary | ICD-10-CM

## 2022-10-24 ENCOUNTER — Ambulatory Visit
Admission: RE | Admit: 2022-10-24 | Discharge: 2022-10-24 | Disposition: A | Discharge status: Home / Self Care | Payer: No Typology Code available for payment source | Source: Ambulatory Visit | Attending: Nurse Practitioner | Admitting: Nurse Practitioner

## 2022-10-24 ENCOUNTER — Other Ambulatory Visit: Payer: Self-pay | Admitting: Nurse Practitioner

## 2022-10-24 DIAGNOSIS — Z1231 Encounter for screening mammogram for malignant neoplasm of breast: Secondary | ICD-10-CM

## 2023-03-19 ENCOUNTER — Other Ambulatory Visit: Payer: Self-pay | Admitting: Neurosurgery

## 2023-03-19 DIAGNOSIS — D329 Benign neoplasm of meninges, unspecified: Secondary | ICD-10-CM

## 2023-04-09 ENCOUNTER — Ambulatory Visit
Admission: RE | Admit: 2023-04-09 | Discharge: 2023-04-09 | Disposition: A | Discharge status: Home / Self Care | Payer: Managed Care, Other (non HMO) | Source: Ambulatory Visit | Attending: Neurosurgery

## 2023-04-09 DIAGNOSIS — D329 Benign neoplasm of meninges, unspecified: Secondary | ICD-10-CM

## 2023-04-09 MED ORDER — GADOPICLENOL 0.5 MMOL/ML IV SOLN
6.0000 mL | Freq: Once | INTRAVENOUS | Status: AC | PRN
  Administered 2023-04-09: 6 mL via INTRAVENOUS

## 2023-09-12 ENCOUNTER — Other Ambulatory Visit: Payer: Self-pay | Admitting: Family Medicine

## 2023-09-12 DIAGNOSIS — Z1231 Encounter for screening mammogram for malignant neoplasm of breast: Secondary | ICD-10-CM

## 2023-10-31 ENCOUNTER — Encounter: Payer: Self-pay | Admitting: Family Medicine

## 2023-10-31 ENCOUNTER — Ambulatory Visit
Admission: RE | Admit: 2023-10-31 | Discharge: 2023-10-31 | Disposition: A | Discharge status: Home / Self Care | Source: Ambulatory Visit | Attending: Family Medicine | Admitting: Family Medicine

## 2023-10-31 DIAGNOSIS — Z1231 Encounter for screening mammogram for malignant neoplasm of breast: Secondary | ICD-10-CM

## 2023-11-05 ENCOUNTER — Other Ambulatory Visit: Payer: Self-pay | Admitting: Family Medicine

## 2023-11-05 DIAGNOSIS — N644 Mastodynia: Secondary | ICD-10-CM

## 2023-12-05 ENCOUNTER — Other Ambulatory Visit: Payer: Self-pay | Admitting: Family Medicine

## 2023-12-05 ENCOUNTER — Ambulatory Visit
Admission: RE | Admit: 2023-12-05 | Discharge: 2023-12-05 | Disposition: A | Discharge status: Home / Self Care | Source: Ambulatory Visit | Attending: Family Medicine | Admitting: Family Medicine

## 2023-12-05 DIAGNOSIS — N6310 Unspecified lump in the right breast, unspecified quadrant: Secondary | ICD-10-CM

## 2023-12-05 DIAGNOSIS — N644 Mastodynia: Secondary | ICD-10-CM

## 2024-06-05 ENCOUNTER — Other Ambulatory Visit: Payer: Self-pay | Admitting: Nurse Practitioner

## 2024-06-05 DIAGNOSIS — N6310 Unspecified lump in the right breast, unspecified quadrant: Secondary | ICD-10-CM

## 2024-06-05 DIAGNOSIS — N644 Mastodynia: Secondary | ICD-10-CM

## 2024-06-09 ENCOUNTER — Inpatient Hospital Stay: Admission: RE | Admit: 2024-06-09 | Discharge: 2024-06-09 | Attending: Family Medicine | Admitting: Family Medicine

## 2024-06-09 ENCOUNTER — Other Ambulatory Visit: Payer: Self-pay | Admitting: Nurse Practitioner

## 2024-06-09 DIAGNOSIS — R928 Other abnormal and inconclusive findings on diagnostic imaging of breast: Secondary | ICD-10-CM

## 2024-06-09 DIAGNOSIS — N6310 Unspecified lump in the right breast, unspecified quadrant: Secondary | ICD-10-CM

## 2024-06-10 ENCOUNTER — Ambulatory Visit: Payer: Self-pay | Admitting: Nurse Practitioner

## 2024-12-08 ENCOUNTER — Encounter

## 2024-12-08 ENCOUNTER — Other Ambulatory Visit
